# Patient Record
Sex: Female | Born: 1940
Health system: Southern US, Community
[De-identification: ages and names within clinical notes are randomized; demographics above are authoritative.]

## PROBLEM LIST (undated history)

## (undated) DIAGNOSIS — Z1239 Encounter for other screening for malignant neoplasm of breast: Secondary | ICD-10-CM

## (undated) DIAGNOSIS — M5090 Cervical disc disorder, unspecified, unspecified cervical region: Secondary | ICD-10-CM

## (undated) DIAGNOSIS — G629 Polyneuropathy, unspecified: Secondary | ICD-10-CM

## (undated) DIAGNOSIS — F419 Anxiety disorder, unspecified: Secondary | ICD-10-CM

## (undated) DIAGNOSIS — Z124 Encounter for screening for malignant neoplasm of cervix: Secondary | ICD-10-CM

## (undated) DIAGNOSIS — M1990 Unspecified osteoarthritis, unspecified site: Secondary | ICD-10-CM

## (undated) DIAGNOSIS — K589 Irritable bowel syndrome without diarrhea: Secondary | ICD-10-CM

## (undated) DIAGNOSIS — K219 Gastro-esophageal reflux disease without esophagitis: Secondary | ICD-10-CM

## (undated) DIAGNOSIS — G250 Essential tremor: Secondary | ICD-10-CM

## (undated) HISTORY — DX: Encounter for other screening for malignant neoplasm of breast: Z12.39

## (undated) HISTORY — PX: BREAST SURGERY: SHX581

## (undated) HISTORY — DX: Encounter for screening for malignant neoplasm of cervix: Z12.4

## (undated) HISTORY — DX: Essential tremor: G25.0

## (undated) HISTORY — DX: Irritable bowel syndrome, unspecified: K58.9

## (undated) HISTORY — DX: Polyneuropathy, unspecified: G62.9

## (undated) HISTORY — PX: BIOPSY BREAST: PRO8

## (undated) HISTORY — DX: Gastro-esophageal reflux disease without esophagitis: K21.9

## (undated) HISTORY — DX: Cervical disc disorder, unspecified, unspecified cervical region: M50.90

## (undated) HISTORY — DX: Anxiety disorder, unspecified: F41.9

## (undated) HISTORY — DX: Unspecified osteoarthritis, unspecified site: M19.90

---

## 1965-10-20 HISTORY — PX: ABDOMINAL HYSTERECTOMY: SHX81

## 1999-01-29 ENCOUNTER — Encounter: Payer: Self-pay | Admitting: Gastroenterology

## 1999-01-29 ENCOUNTER — Ambulatory Visit (HOSPITAL_COMMUNITY): Admission: RE | Admit: 1999-01-29 | Discharge: 1999-01-29 | Payer: Self-pay | Admitting: Gastroenterology

## 2001-03-02 ENCOUNTER — Encounter (INDEPENDENT_AMBULATORY_CARE_PROVIDER_SITE_OTHER): Payer: Self-pay | Admitting: Specialist

## 2001-03-02 ENCOUNTER — Ambulatory Visit (HOSPITAL_COMMUNITY): Admission: RE | Admit: 2001-03-02 | Discharge: 2001-03-02 | Payer: Self-pay | Admitting: Gastroenterology

## 2002-10-20 HISTORY — PX: ROTATOR CUFF REPAIR: SHX139

## 2003-03-21 ENCOUNTER — Encounter: Admission: RE | Admit: 2003-03-21 | Discharge: 2003-03-21 | Payer: Self-pay | Admitting: Gastroenterology

## 2003-03-21 ENCOUNTER — Encounter: Payer: Self-pay | Admitting: Gastroenterology

## 2004-06-03 ENCOUNTER — Ambulatory Visit (HOSPITAL_COMMUNITY): Admission: RE | Admit: 2004-06-03 | Discharge: 2004-06-03 | Payer: Self-pay | Admitting: Gastroenterology

## 2004-06-03 ENCOUNTER — Encounter (INDEPENDENT_AMBULATORY_CARE_PROVIDER_SITE_OTHER): Payer: Self-pay | Admitting: Specialist

## 2004-08-19 ENCOUNTER — Ambulatory Visit: Payer: Self-pay | Admitting: Family Medicine

## 2005-05-21 ENCOUNTER — Ambulatory Visit: Payer: Self-pay | Admitting: Rheumatology

## 2005-08-28 ENCOUNTER — Ambulatory Visit: Payer: Self-pay | Admitting: Family Medicine

## 2005-10-20 DIAGNOSIS — M5090 Cervical disc disorder, unspecified, unspecified cervical region: Secondary | ICD-10-CM

## 2005-10-20 HISTORY — PX: BREAST BIOPSY: SHX20

## 2005-10-20 HISTORY — DX: Cervical disc disorder, unspecified, unspecified cervical region: M50.90

## 2005-10-30 ENCOUNTER — Encounter: Admission: RE | Admit: 2005-10-30 | Discharge: 2005-10-30 | Payer: Self-pay | Admitting: Gastroenterology

## 2005-12-05 ENCOUNTER — Ambulatory Visit: Payer: Self-pay | Admitting: Family Medicine

## 2006-02-18 ENCOUNTER — Encounter: Admission: RE | Admit: 2006-02-18 | Discharge: 2006-02-18 | Payer: Self-pay | Admitting: Gastroenterology

## 2006-08-31 ENCOUNTER — Ambulatory Visit: Payer: Self-pay | Admitting: General Surgery

## 2006-11-24 ENCOUNTER — Ambulatory Visit (HOSPITAL_COMMUNITY): Admission: RE | Admit: 2006-11-24 | Discharge: 2006-11-24 | Payer: Self-pay | Admitting: Gastroenterology

## 2007-09-21 ENCOUNTER — Ambulatory Visit: Payer: Self-pay | Admitting: Unknown Physician Specialty

## 2007-10-30 LAB — HM PAP SMEAR

## 2008-02-09 ENCOUNTER — Encounter: Admission: RE | Admit: 2008-02-09 | Discharge: 2008-02-09 | Payer: Self-pay | Admitting: Gastroenterology

## 2008-08-02 ENCOUNTER — Ambulatory Visit: Payer: Self-pay | Admitting: Unknown Physician Specialty

## 2008-10-03 ENCOUNTER — Ambulatory Visit: Payer: Self-pay | Admitting: Unknown Physician Specialty

## 2008-12-11 ENCOUNTER — Ambulatory Visit: Payer: Self-pay | Admitting: Unknown Physician Specialty

## 2009-01-01 ENCOUNTER — Ambulatory Visit: Payer: Self-pay | Admitting: Pain Medicine

## 2009-01-04 ENCOUNTER — Ambulatory Visit: Payer: Self-pay | Admitting: Pain Medicine

## 2009-01-17 LAB — HM COLONOSCOPY

## 2009-01-23 ENCOUNTER — Ambulatory Visit: Payer: Self-pay | Admitting: Physician Assistant

## 2009-02-06 ENCOUNTER — Ambulatory Visit: Payer: Self-pay | Admitting: Pain Medicine

## 2009-02-20 ENCOUNTER — Ambulatory Visit: Payer: Self-pay | Admitting: Pain Medicine

## 2009-03-06 ENCOUNTER — Ambulatory Visit: Payer: Self-pay | Admitting: Physician Assistant

## 2009-10-08 ENCOUNTER — Ambulatory Visit: Payer: Self-pay

## 2010-07-05 ENCOUNTER — Ambulatory Visit (HOSPITAL_COMMUNITY): Admission: RE | Admit: 2010-07-05 | Discharge: 2010-07-05 | Payer: Self-pay | Admitting: Gastroenterology

## 2010-10-09 ENCOUNTER — Ambulatory Visit: Payer: Self-pay | Admitting: Unknown Physician Specialty

## 2010-10-20 HISTORY — PX: BREAST BIOPSY: SHX20

## 2010-10-23 ENCOUNTER — Ambulatory Visit: Payer: Self-pay | Admitting: Unknown Physician Specialty

## 2010-11-25 ENCOUNTER — Ambulatory Visit: Payer: Self-pay | Admitting: General Surgery

## 2010-11-27 LAB — PATHOLOGY REPORT

## 2011-03-07 NOTE — Op Note (Signed)
NAME:  [facility], Vanessa Brown                             ACCOUNT NO.:  [id number]   MEDICAL RECORD NO.:  [id number]                   PATIENT TYPE:  AMB   LOCATION:  ENDO                                 FACILITY:  [facility]   PHYSICIAN:  John C. Madilyn Fireman, M.D.                 DATE OF BIRTH:  Jan 03, 1941   DATE OF PROCEDURE:  06/03/2004  DATE OF DISCHARGE:                                 OPERATIVE REPORT   PROCEDURE:  Colonoscopy with polypectomy.   INDICATION FOR PROCEDURE:  History of adenomatous colon polyps.   DESCRIPTION OF PROCEDURE:  The patient was placed in the left lateral  decubitus position and placed on the pulse monitor with continuous low-flow  oxygen delivered by nasal cannula.  She was sedated with 50 mcg IV fentanyl  and 5 mg IV Versed in addition to the medicine given for the previous EGD.  The Olympus video colonoscope was inserted into the rectum and advanced to  the cecum, confirmed by transillumination at McBurney'Brown point and  visualization of the ileocecal valve and appendiceal orifice.  The prep was  excellent.  The cecum, ascending, and transverse colon all appeared normal  with no masses, polyps, diverticula, or other mucosal abnormalities.  Within  the descending and sigmoid colon, there were seen a few scattered  diverticula and no other abnormalities.  Within the sigmoid colon, there was  an 8 mm polyp that was removed by snare.  The remainder of the sigmoid and  rectum appeared normal.  The scope was then withdrawn and the patient  returned to the recovery room in a stable condition.  She tolerated the  procedure well, and there were no immediate complications.   IMPRESSION:  1. Sigmoid colon polyp.  2. Left-sided diverticulosis.   PLAN:  Await histology to determine interval for next colonoscopy.                                               John C. Madilyn Fireman, M.D.    JCH/MEDQ  D:  06/03/2004  T:  06/03/2004  Job:  [id number]   cc:   Cassell Clement, M.D.  [address], [address]  [place]  [place] [address]  Fax: [phone]

## 2011-03-07 NOTE — Procedures (Signed)
[facility]  Patient:    Vanessa Brown, Vanessa Brown                          MRN: [id number] Proc. Date: 03/02/01 Adm. Date:  [id number] Attending:  Louie Bun CC:         Clovis Pu Patty Sermons, M.D.   Procedure Report  PROCEDURE:  Esophagogastroduodenoscopy with biopsy.  ENDOSCOPIST:  Everardo All. Madilyn Fireman, M.D.  INDICATIONS FOR PROCEDURE: Epigastric abdominal pain.  The patient is undergoing repeat colonoscopy due to history of adenomatous colon polyps.  DESCRIPTION OF PROCEDURE:   The patient was placed in the left lateral decubitus position and placed on the pulse monitor with continuous low-flow oxygen delivered by nasal cannula.  She was sedated with 50 mg IV Demerol and 6 mg IV Versed.  The Olympus video endoscope was advanced under direct vision into the oropharynx and esophagus.  The esophagus was straight and of normal caliber with the squamocolumnar line at 38 cm.  There was no visible hiatal hernia, ring, stricture, or other abnormality to the GE junction.  The stomach was entered, and a small amount of liquid secretion was suctioned from the fundus.  Retroflexed view of the cardia was unremarkable.  The fundus and proximal body appeared normal.   In the distal body and antrum, there was diffuse erythema and multiple, approximately 6 or 7 shallow ulcers and erosions with exudate, most with a black surface primarily along the greater curvature.  These were consistent with ulcerations induced by nonsteroidal anti-inflammatory drugs.  There were no stigma of hemorrhage other than the black discoloration of the erosions in general.  Most of these were near the level of the angularis but on the greater curvature and did not involve the prepyloric area.  The pylorus itself was not deformed and easily allowed passage of the endoscope tip to the duodenum.  The bulb and second portion were well inspected and appeared to be within normal limits.  The endoscope was  then withdrawn back into the stomach and CLOtest obtained.  The scope was then withdrawn, and the patient  returned to the recovery room in stable condition.  She tolerated the procedure well, and there were no immediate complications.  IMPRESSION:  Multiple gastric ulcers and erosions.  PLAN: 1. Review NSAID history. 2. Await CLOtest and treat for eradication of Helicobacter if positive.    Otherwise treat with proton pump inhibitor. DD:  03/02/01 TD:  03/02/01 Job: [id number]

## 2011-03-07 NOTE — Op Note (Signed)
NAME:  [facility], Vanessa Brown                             ACCOUNT NO.:  [id number]   MEDICAL RECORD NO.:  [id number]                   PATIENT TYPE:  AMB   LOCATION:  ENDO                                 FACILITY:  [facility]   PHYSICIAN:  John C. Madilyn Fireman, M.D.                 DATE OF BIRTH:  1941/01/18   DATE OF PROCEDURE:  06/03/2004  DATE OF DISCHARGE:                                 OPERATIVE REPORT   PROCEDURE:  Esophagogastroduodenoscopy with biopsy.   INDICATIONS FOR PROCEDURE:  Patient with history of gastroesophageal reflux  disease who has had a globus sensation in her throat as well as chronic sore  throat, with ear, nose, and throat evaluation incriminating ongoing reflux  as the source of her symptoms.  Her proton pump inhibitors have been  increased to b.i.d. dosing.  She has also had a history of multiple gastric  ulcers in the past and is undergoing routine surveillance  esophagogastroduodenoscopy today.   DESCRIPTION OF PROCEDURE:  The patient was placed in the left lateral  decubitus position and placed on the pulse monitor with continuous low-flow  oxygen delivered by nasal cannula.  She was sedated with 50 mcg of IV  Fentanyl and 5 mg of IV Versed.  The videoendoscope was advanced under  direct vision into the oropharynx and esophagus.  The esophagus was straight  and of normal caliber, with the squamocolumnar line at 37 cm above a 2-cm  hiatal hernia.  There was no visible esophagitis.  The stomach was entered,  and a small amount of liquid secretions were suctioned from the fundus.  A  retroflex view of the cardia was unremarkable.  The fundus and body appeared  normal.  The antrum showed erythema and granularity, consistent with mild  gastritis with no focal erosions or ulcers.  CLOtest was obtained.  The  pylorus was nondeformed and easily allowed passage of the endoscope tip into  the duodenum.  Both the bulb and second portion were well-inspected and  appeared to within  normal limits.  The scope was then withdrawn, and the  patient prepared for colonoscopy.  She tolerated the procedure well, and  there were no immediate complications.   IMPRESSION:  Small hiatal hernia with mild antral gastritis.   PLAN:  Await CLOtest and will proceed with colonoscopy.                                               John C. Madilyn Fireman, M.D.    JCH/MEDQ  D:  06/03/2004  T:  06/03/2004  Job:  [id number]

## 2011-03-07 NOTE — Procedures (Signed)
[facility]  Patient:    Vanessa Brown, Vanessa Brown                          MRN: [id number] Proc. Date: 03/02/01 Adm. Date:  [id number] Attending:  Louie Bun CC:         Clovis Pu Patty Sermons, M.D.   Procedure Report  PROCEDURE:  Colonoscopy with polypectomy.  GASTROENTEROLOGIST:  Everardo All. Madilyn Fireman, M.D.  INDICATIONS FOR PROCEDURE:  History of adenomatous colon polyps three years ago.  DESCRIPTION OF PROCEDURE:   The patient was placed in the left lateral decubitus position and placed on the pulse monitor with continuous low-flow oxygen delivered by nasal cannula.  She was sedated with 25 mg IV Demerol and 2 mg IV Versed in addition to the medications given for the previous EGD.  The Olympus video colonoscope was inserted into the rectum and advanced to the cecum, confirmed by transillumination of McBurneys point and visualization of the ileocecal valve and appendiceal orifice.  The prep was excellent.  The cecum and ascending colon appeared normal as did the transverse colon with no polyps, masses, diverticula, or other mucosal abnormalities.  Within the descending colon there was seen two small polyps close to each other, one 8 and one 6 mm in diameter.  The first was removed with mini snare.  The second was fulgurated by hot biopsy.  The sigmoid and rectum appeared normal with no further polyps, masses, diverticula, or other mucosal abnormalities.  The scope was then withdrawn, and the patient returned to the recovery room in stable condition.  She tolerated the procedure well, and there were no immediate complications.  IMPRESSIONS:  Two descending colon polyps, otherwise normal colonoscopy.  PLAN:  Await histology and will probably repeat colonoscopy in three years. DD:  03/02/01 TD:  03/02/01 Job: [id number]

## 2011-05-07 ENCOUNTER — Ambulatory Visit: Payer: Self-pay | Admitting: General Surgery

## 2011-09-03 ENCOUNTER — Encounter: Payer: Self-pay | Admitting: Internal Medicine

## 2011-09-03 ENCOUNTER — Ambulatory Visit (INDEPENDENT_AMBULATORY_CARE_PROVIDER_SITE_OTHER): Payer: Medicare Other | Admitting: Internal Medicine

## 2011-09-03 VITALS — BP 110/60 | HR 70 | Temp 98.2°F | Resp 16 | Ht 63.0 in | Wt 138.8 lb

## 2011-09-03 DIAGNOSIS — G250 Essential tremor: Secondary | ICD-10-CM

## 2011-09-03 DIAGNOSIS — G252 Other specified forms of tremor: Secondary | ICD-10-CM

## 2011-09-03 DIAGNOSIS — Z8719 Personal history of other diseases of the digestive system: Secondary | ICD-10-CM | POA: Insufficient documentation

## 2011-09-03 DIAGNOSIS — K2970 Gastritis, unspecified, without bleeding: Secondary | ICD-10-CM

## 2011-09-03 DIAGNOSIS — G629 Polyneuropathy, unspecified: Secondary | ICD-10-CM | POA: Insufficient documentation

## 2011-09-03 DIAGNOSIS — Z124 Encounter for screening for malignant neoplasm of cervix: Secondary | ICD-10-CM | POA: Insufficient documentation

## 2011-09-03 DIAGNOSIS — Z8711 Personal history of peptic ulcer disease: Secondary | ICD-10-CM | POA: Insufficient documentation

## 2011-09-03 DIAGNOSIS — K2990 Gastroduodenitis, unspecified, without bleeding: Secondary | ICD-10-CM

## 2011-09-03 DIAGNOSIS — Z1239 Encounter for other screening for malignant neoplasm of breast: Secondary | ICD-10-CM

## 2011-09-03 MED ORDER — ESTRADIOL 0.025 MG/24HR TD PTTW: 1.0000 | MEDICATED_PATCH | TRANSDERMAL | Status: DC

## 2011-09-03 MED ORDER — CLONIDINE HCL 0.2 MG PO TABS: 0.2000 mg | ORAL_TABLET | Freq: Every day | ORAL | Status: DC

## 2011-09-03 NOTE — Progress Notes (Signed)
Subjective:    Patient ID: Vanessa Brown, female    DOB: 1941/04/06, 70 y.o.   MRN: [id number]  HPI  70 yo white female with history of IBS presents with chronic recurrent pains involvong right and left lateral thorax, as well as persistent abdominal pain.  Has had comprehensive GI evaluation as well as MRIs of lumbar spine to evaluate source of pain .  Currently her pain in the abdominal area is burning in nature, but the lateral chest wall pains are more "sore" in nature.  She is not troubled by the pains,  feels that since they are chronic and have been worked up sufficiently she can tolerate them.  She did not tolerate prior trial of Lyrica started by the pain clinic. She hras tried Lidoderm patches for neuropathy in the past  with limited success on the posterior thighs .  Avoids NSAIDs  due to history of ulcers, but tylenol helps.  Past Medical History  Diagnosis Date  . Anxiety   . Irritable bowel syndrome (IBS)     Dorena Cookey, [facility]  . Arthritis     spine, prior Pain Clinic patient  . Screening for breast cancer   . Screening for cervical cancer   . Tremor, essential     treated with primidone 25 mg qhs  . GERD (gastroesophageal reflux disease)   . Gastric ulcer     H Pylori positive  . Duodenal ulcer   . Neuropathy     "toilet seat", Sherryll Burger), caused  by sitting on commode    No current outpatient prescriptions on file prior to visit.       Review of Systems  Constitutional: Negative for fever, chills and unexpected weight change.  HENT: Negative for hearing loss, ear pain, nosebleeds, congestion, sore throat, facial swelling, rhinorrhea, sneezing, mouth sores, trouble swallowing, neck pain, neck stiffness, voice change, postnasal drip, sinus pressure, tinnitus and ear discharge.   Eyes: Negative for pain, discharge, redness and visual disturbance.  Respiratory: Negative for cough, chest tightness, shortness of breath, wheezing and stridor.   Cardiovascular: Negative for  chest pain, palpitations and leg swelling.  Musculoskeletal: Negative for myalgias and arthralgias.  Skin: Negative for color change and rash.  Neurological: Negative for dizziness, weakness, light-headedness and headaches.  Hematological: Negative for adenopathy.      BP 110/60  Pulse 70  Temp(Src) 98.2 F (36.8 C) (Oral)  Resp 16  Ht 5\' 3"  (1.6 m)  Wt 138 lb 12 oz (62.937 kg)  BMI 24.58 kg/m2  SpO2 98%  Objective:   Physical Exam  Constitutional: She is oriented to person, place, and time. She appears well-developed and well-nourished.  HENT:  Mouth/Throat: Oropharynx is clear and moist.  Eyes: EOM are normal. Pupils are equal, round, and reactive to light. No scleral icterus.  Neck: Normal range of motion. Neck supple. No JVD present. No thyromegaly present.  Cardiovascular: Normal rate, regular rhythm, normal heart sounds and intact distal pulses.   Pulmonary/Chest: Effort normal and breath sounds normal.  Abdominal: Soft. Bowel sounds are normal. She exhibits no mass. There is no tenderness.  Musculoskeletal: Normal range of motion. She exhibits no edema.  Lymphadenopathy:    She has no cervical adenopathy.  Neurological: She is alert and oriented to person, place, and time.  Skin: Skin is warm and dry.  Psychiatric: She has a normal mood and affect.       Assessment & Plan:  Irritable Bowel Syndrome;  Currently managed with food restriction .  No further workup at this time. Neuropathy:  Records requested.   History of gastric/duodenal ulcer:  Continue NSAID avoidance, continue PPI.  Trial of Mylanta for burnign symptoms,  If no improvement H Pylori testing offered.

## 2011-09-03 NOTE — Patient Instructions (Addendum)
I recommend a trial of Mylanta gas for your burning stomach, to see if it works better than the gaviscon tablets.  If no improvement in a few weeks, we should check you for the H Pylori bacteria again .

## 2011-09-06 ENCOUNTER — Encounter: Payer: Self-pay | Admitting: Internal Medicine

## 2011-09-06 DIAGNOSIS — G250 Essential tremor: Secondary | ICD-10-CM | POA: Insufficient documentation

## 2011-09-06 DIAGNOSIS — K219 Gastro-esophageal reflux disease without esophagitis: Secondary | ICD-10-CM | POA: Insufficient documentation

## 2011-09-06 DIAGNOSIS — Z1239 Encounter for other screening for malignant neoplasm of breast: Secondary | ICD-10-CM | POA: Insufficient documentation

## 2011-09-06 DIAGNOSIS — K589 Irritable bowel syndrome without diarrhea: Secondary | ICD-10-CM | POA: Insufficient documentation

## 2011-09-06 NOTE — Assessment & Plan Note (Signed)
Mammogram ordered as she is due.

## 2011-09-06 NOTE — Assessment & Plan Note (Signed)
Bdiagnosied by priro neurology evaluation. Continue Primidone.

## 2011-10-28 ENCOUNTER — Telehealth: Payer: Self-pay | Admitting: *Deleted

## 2011-10-28 DIAGNOSIS — Z79890 Hormone replacement therapy (postmenopausal): Secondary | ICD-10-CM

## 2011-10-28 MED ORDER — ESTRADIOL 0.025 MG/24HR TD PTWK: 1.0000 | MEDICATED_PATCH | TRANSDERMAL | Status: DC

## 2011-10-28 NOTE — Telephone Encounter (Signed)
Yes, rx sent.

## 2011-10-28 NOTE — Telephone Encounter (Signed)
Pharmacy is calling about patient's vivelle-dot rx that was sent in NOv. It is going to be a $30 copay for the patient and patient is asking if she can go back to the climara that she was originally on when she saw her previous Dr. Because it is only a $10 copay. Please advise.

## 2011-10-29 ENCOUNTER — Encounter: Payer: Self-pay | Admitting: Internal Medicine

## 2011-10-29 ENCOUNTER — Ambulatory Visit (INDEPENDENT_AMBULATORY_CARE_PROVIDER_SITE_OTHER): Payer: Medicare Other | Admitting: Internal Medicine

## 2011-10-29 DIAGNOSIS — E039 Hypothyroidism, unspecified: Secondary | ICD-10-CM

## 2011-10-29 DIAGNOSIS — M5090 Cervical disc disorder, unspecified, unspecified cervical region: Secondary | ICD-10-CM | POA: Insufficient documentation

## 2011-10-29 DIAGNOSIS — M899 Disorder of bone, unspecified: Secondary | ICD-10-CM | POA: Diagnosis not present

## 2011-10-29 DIAGNOSIS — G589 Mononeuropathy, unspecified: Secondary | ICD-10-CM

## 2011-10-29 DIAGNOSIS — M8580 Other specified disorders of bone density and structure, unspecified site: Secondary | ICD-10-CM

## 2011-10-29 DIAGNOSIS — G629 Polyneuropathy, unspecified: Secondary | ICD-10-CM

## 2011-10-29 DIAGNOSIS — E538 Deficiency of other specified B group vitamins: Secondary | ICD-10-CM

## 2011-10-29 DIAGNOSIS — R109 Unspecified abdominal pain: Secondary | ICD-10-CM

## 2011-10-29 DIAGNOSIS — M949 Disorder of cartilage, unspecified: Secondary | ICD-10-CM | POA: Diagnosis not present

## 2011-10-29 DIAGNOSIS — M5126 Other intervertebral disc displacement, lumbar region: Secondary | ICD-10-CM

## 2011-10-29 DIAGNOSIS — R52 Pain, unspecified: Secondary | ICD-10-CM

## 2011-10-29 DIAGNOSIS — R1084 Generalized abdominal pain: Secondary | ICD-10-CM

## 2011-10-29 MED ORDER — TRAMADOL HCL 50 MG PO TABS: 50.0000 mg | ORAL_TABLET | Freq: Four times a day (QID) | ORAL | Status: DC | PRN

## 2011-10-29 MED ORDER — DEXLANSOPRAZOLE 60 MG PO CPDR: 60.0000 mg | DELAYED_RELEASE_CAPSULE | Freq: Every day | ORAL | Status: AC

## 2011-10-29 MED ORDER — DICYCLOMINE HCL 20 MG PO TABS: 20.0000 mg | ORAL_TABLET | Freq: Three times a day (TID) | ORAL | Status: DC

## 2011-10-29 NOTE — Patient Instructions (Signed)
For your stomach I am adding antispasmodic  called dicyclomine.  Take 30 minutes prior to each meal.   Adding Dexilant for the acid.  Adding tramadol for relief of back and hip pain.  We may end up repeating the MRI of your lower spine, if your prior MRI is suspicious for spinal stenosis

## 2011-10-29 NOTE — Progress Notes (Signed)
Subjective:    Patient ID: Vanessa Brown, female    DOB: [birth year], 71 y.o.   MRN: [id number]  HPI  71 yr old with history of H pylori  gastritis and IBs presents with 3 week history of abdominal bloating,  aching,  nausea, and constipation alternating with diarrhea.  Has occasions of moderate reflux eventually relieved with gaviscon.  Stopped omeprazole bc it wasn't helping. No prior relief with other PPIs but can't name them. Last endoscopy was possibly done in 2010,  CT scan of abdomen in 2011 normal except for DDD .   Also having low back pain which radiates to both hips and legs which ache.   Last MRI of lumbar spine showed bulging disks.    Has been taking only tylenol,  No NSAIDs  Or ASA. Reports bottom of her feet burning.  History of "toilet seat neuropathy' diagnosed and treated by Dr. Sherryll Burger with lidocaine patches.  Waking up at 2 am , but not being kept up by burning.  Uses Zquil for insomnia.    Past Medical History  Diagnosis Date  . Anxiety   . Irritable bowel syndrome (IBS)     Dorena Cookey, [facility]  . Arthritis     spine, prior Pain Clinic patient  . Screening for breast cancer   . Screening for cervical cancer   . Tremor, essential     treated with primidone 25 mg qhs  . GERD (gastroesophageal reflux disease)   . Gastric ulcer     H Pylori positive  . Duodenal ulcer   . Neuropathy     "toilet seat", Sherryll Burger), caused  by sitting on commode  . Cervical back pain with evidence of disc disease 2007    prior epidural steorid injection s   Current Outpatient Prescriptions on File Prior to Visit  Medication Sig Dispense Refill  . calcium carbonate (OS-CAL) 600 MG TABS Take 600 mg by mouth 2 (two) times daily with a meal.        . cloNIDine (CATAPRES) 0.2 MG tablet Take 1 tablet (0.2 mg total) by mouth at bedtime.  30 tablet  11  . estradiol (CLIMARA) 0.025 mg/24hr Place 1 patch (0.025 mg total) onto the skin once a week.  4 patch  12  . omeprazole (PRILOSEC) 40 MG capsule Take 40  mg by mouth 2 (two) times daily as needed.        . primidone (MYSOLINE) 50 MG tablet Take one-half tablet in the morning, and one-half tablet in the evening.           Review of Systems  Constitutional: Negative for fever, chills and unexpected weight change.  HENT: Negative for hearing loss, ear pain, nosebleeds, congestion, sore throat, facial swelling, rhinorrhea, sneezing, mouth sores, trouble swallowing, neck pain, neck stiffness, voice change, postnasal drip, sinus pressure, tinnitus and ear discharge.   Eyes: Negative for pain, discharge, redness and visual disturbance.  Respiratory: Negative for cough, chest tightness, shortness of breath, wheezing and stridor.   Cardiovascular: Negative for chest pain, palpitations and leg swelling.  Gastrointestinal: Positive for abdominal pain, diarrhea and constipation. Negative for vomiting.  Musculoskeletal: Positive for back pain. Negative for myalgias and arthralgias.  Skin: Negative for color change and rash.  Neurological: Positive for tremors. Negative for dizziness, weakness, light-headedness and headaches.  Hematological: Negative for adenopathy.  Psychiatric/Behavioral: Positive for sleep disturbance.       Objective:   Physical Exam  Constitutional: She is oriented to person, place, and  time. She appears well-developed and well-nourished.  HENT:  Mouth/Throat: Oropharynx is clear and moist.  Eyes: EOM are normal. Pupils are equal, round, and reactive to light. No scleral icterus.  Neck: Normal range of motion. Neck supple. No JVD present. No thyromegaly present.  Cardiovascular: Normal rate, regular rhythm, normal heart sounds and intact distal pulses.   Pulmonary/Chest: Effort normal and breath sounds normal.  Abdominal: Soft. Bowel sounds are normal. She exhibits no mass. There is no tenderness.  Musculoskeletal: Normal range of motion. She exhibits no edema.  Lymphadenopathy:    She has no cervical adenopathy.    Neurological: She is alert and oriented to person, place, and time.  Skin: Skin is warm and dry.  Psychiatric: She has a normal mood and affect.       Assessment & Plan:   Lumbago due to displacement of intervertebral disc Multilevel, without spinal stenosis, by Oct 2002 MRI .  Repeated in 2010 with progression noted and nerve root comopression suggested at multiple levels.  Prior epidural injections by Laban Emperor.   Osteopenia By 2009 DEXA .  Not sure if repeat was done in 2012 as planned by Dr. Barnabas Lister per his notes.   Neuropathy Her previous neuropathy diagnosis did not involve the feet (posterior femoral cutaneous nerve lesion) by Dr.Shah of [facility]. She did not have EMG nerve conduction studies at that time. Given her concurrent tremor, burning feet, known DDD of lumbar spine with progression by 2010 MRI.  will refer to Dr. Modesto Charon for assessment with EMG/nerve conduction studies  And hopefully a unifying diagnosis. Will do serolgoies to rule otu reversible causes (B12, TSH, 24 hr urine collection for heavy metals).   Abdominal pain, acute, generalized She has a history of ulcer and IBS, but her lipase was mildly elevated.  Will repeat and if elevated, consideer MRCP vs reevaluation by her gastroenterologist Dorena Cookey of [place].    Over 72 minutes was spent reviewing patients records from Neurology, Orthopedics, Pain cliniC, gastrenterolgy,  And Gynecology and evaluating patient in office.   Updated Medication List Outpatient Encounter Prescriptions as of 10/29/2011  Medication Sig Dispense Refill  . calcium carbonate (OS-CAL) 600 MG TABS Take 600 mg by mouth 2 (two) times daily with a meal.        . cloNIDine (CATAPRES) 0.2 MG tablet Take 1 tablet (0.2 mg total) by mouth at bedtime.  30 tablet  11  . estradiol (CLIMARA) 0.025 mg/24hr Place 1 patch (0.025 mg total) onto the skin once a week.  4 patch  12  . omeprazole (PRILOSEC) 40 MG capsule Take 40 mg by mouth 2 (two) times  daily as needed.        . primidone (MYSOLINE) 50 MG tablet Take one-half tablet in the morning, and one-half tablet in the evening.       . traMADol (ULTRAM) 50 MG tablet Take 1 tablet (50 mg total) by mouth every 6 (six) hours as needed.  120 tablet  3  . DISCONTD: traMADol (ULTRAM) 50 MG tablet Take 50 mg by mouth every 6 (six) hours as needed.      Marland Kitchen dexlansoprazole (DEXILANT) 60 MG capsule Take 1 capsule (60 mg total) by mouth daily.  30 capsule  3  . dicyclomine (BENTYL) 20 MG tablet Take 1 tablet (20 mg total) by mouth 3 (three) times daily before meals.  90 tablet  3

## 2011-10-30 ENCOUNTER — Telehealth: Payer: Self-pay | Admitting: *Deleted

## 2011-10-30 ENCOUNTER — Encounter: Payer: Self-pay | Admitting: Internal Medicine

## 2011-10-30 DIAGNOSIS — M8580 Other specified disorders of bone density and structure, unspecified site: Secondary | ICD-10-CM | POA: Insufficient documentation

## 2011-10-30 DIAGNOSIS — R1084 Generalized abdominal pain: Secondary | ICD-10-CM | POA: Insufficient documentation

## 2011-10-30 DIAGNOSIS — R109 Unspecified abdominal pain: Secondary | ICD-10-CM

## 2011-10-30 LAB — COMPREHENSIVE METABOLIC PANEL
ALT: 20 U/L (ref 0–35)
AST: 23 U/L (ref 0–37)
Alkaline Phosphatase: 76 U/L (ref 39–117)
Chloride: 103 mEq/L (ref 96–112)
Creatinine, Ser: 0.8 mg/dL (ref 0.4–1.2)
Total Bilirubin: 0.7 mg/dL (ref 0.3–1.2)

## 2011-10-30 NOTE — Telephone Encounter (Signed)
Patient's husband brought in patient's stool sample and while he was here he said that patient is having a lot nausea, vomiting from the dicyclomine that was called in. She wants to know if she can get something else called in.

## 2011-10-30 NOTE — Assessment & Plan Note (Addendum)
Multilevel, without spinal stenosis, by Oct 2002 MRI .  Repeated in 2010 with progression noted and nerve root comopression suggested at multiple levels.  Prior epidural injections by Laban Emperor.

## 2011-10-30 NOTE — Assessment & Plan Note (Signed)
Her previous neuropathy diagnosis did not involve the feet (posterior femoral cutaneous nerve lesion) by Dr.Shah of [facility]. She did not have EMG nerve conduction studies at that time. Given her concurrent tremor, burning feet, known DDD of lumbar spine with progression by 2010 MRI.  will refer to Dr. Modesto Charon for assessment with EMG/nerve conduction studies  And hopefully a unifying diagnosis. Will do serolgoies to rule otu reversible causes (B12, TSH, 24 hr urine collection for heavy metals).

## 2011-10-30 NOTE — Assessment & Plan Note (Signed)
She has a history of ulcer and IBS, but her lipase was mildly elevated.  Will repeat and if elevated, consideer MRCP vs reevaluation by her gastroenterologist Dorena Cookey of [place].

## 2011-10-30 NOTE — Assessment & Plan Note (Signed)
By 2009 DEXA .  Not sure if repeat was done in 2012 as planned by Dr. Barnabas Lister per his notes.

## 2011-10-31 LAB — HELICOBACTER PYLORI  SPECIAL ANTIGEN: H. PYLORI Antigen: NEGATIVE

## 2011-10-31 MED ORDER — PROMETHAZINE HCL 12.5 MG PO TABS: 12.5000 mg | ORAL_TABLET | Freq: Three times a day (TID) | ORAL | Status: AC | PRN

## 2011-10-31 NOTE — Telephone Encounter (Signed)
Left message notifying patient. Will try calling her again on Monday to make sure she got the message and to make sure she got her rx.

## 2011-10-31 NOTE — Telephone Encounter (Signed)
Tell her to stop the dicyclcomine and call her in promethazine 12.5 mg one tablet every 8 hours prn nausea  #60 1 refills

## 2011-11-04 ENCOUNTER — Other Ambulatory Visit: Payer: Self-pay | Admitting: Internal Medicine

## 2011-11-04 ENCOUNTER — Other Ambulatory Visit (INDEPENDENT_AMBULATORY_CARE_PROVIDER_SITE_OTHER): Payer: Medicare Other | Admitting: *Deleted

## 2011-11-04 DIAGNOSIS — E538 Deficiency of other specified B group vitamins: Secondary | ICD-10-CM

## 2011-11-04 DIAGNOSIS — R109 Unspecified abdominal pain: Secondary | ICD-10-CM | POA: Diagnosis not present

## 2011-11-04 DIAGNOSIS — E039 Hypothyroidism, unspecified: Secondary | ICD-10-CM

## 2011-11-04 DIAGNOSIS — G629 Polyneuropathy, unspecified: Secondary | ICD-10-CM

## 2011-11-04 DIAGNOSIS — G589 Mononeuropathy, unspecified: Secondary | ICD-10-CM | POA: Diagnosis not present

## 2011-11-04 LAB — TSH: TSH: 2.54 u[IU]/mL (ref 0.35–5.50)

## 2011-11-04 LAB — LIPASE: Lipase: 31 U/L (ref 11.0–59.0)

## 2011-11-04 LAB — VITAMIN B12: Vitamin B-12: 324 pg/mL (ref 211–911)

## 2011-11-04 LAB — FOLATE: Folate: 11.1 ng/mL (ref >5.9)

## 2011-11-18 ENCOUNTER — Ambulatory Visit: Payer: Self-pay | Admitting: Internal Medicine

## 2011-11-18 DIAGNOSIS — R928 Other abnormal and inconclusive findings on diagnostic imaging of breast: Secondary | ICD-10-CM | POA: Diagnosis not present

## 2011-11-18 DIAGNOSIS — Z1231 Encounter for screening mammogram for malignant neoplasm of breast: Secondary | ICD-10-CM | POA: Diagnosis not present

## 2011-11-25 ENCOUNTER — Other Ambulatory Visit: Payer: Self-pay | Admitting: Gastroenterology

## 2011-11-25 DIAGNOSIS — R1013 Epigastric pain: Secondary | ICD-10-CM | POA: Diagnosis not present

## 2011-11-28 ENCOUNTER — Ambulatory Visit
Admission: RE | Admit: 2011-11-28 | Discharge: 2011-11-28 | Disposition: A | Discharge status: Home / Self Care | Payer: Medicare Other | Source: Ambulatory Visit | Attending: Gastroenterology | Admitting: Gastroenterology

## 2011-11-28 DIAGNOSIS — Q638 Other specified congenital malformations of kidney: Secondary | ICD-10-CM | POA: Diagnosis not present

## 2011-11-28 DIAGNOSIS — R109 Unspecified abdominal pain: Secondary | ICD-10-CM | POA: Diagnosis not present

## 2011-12-19 ENCOUNTER — Ambulatory Visit (INDEPENDENT_AMBULATORY_CARE_PROVIDER_SITE_OTHER): Payer: Medicare Other | Admitting: Internal Medicine

## 2011-12-19 ENCOUNTER — Encounter: Payer: Self-pay | Admitting: Internal Medicine

## 2011-12-19 DIAGNOSIS — Z0000 Encounter for general adult medical examination without abnormal findings: Secondary | ICD-10-CM | POA: Diagnosis not present

## 2011-12-19 DIAGNOSIS — G629 Polyneuropathy, unspecified: Secondary | ICD-10-CM

## 2011-12-19 DIAGNOSIS — K589 Irritable bowel syndrome without diarrhea: Secondary | ICD-10-CM

## 2011-12-19 DIAGNOSIS — M25559 Pain in unspecified hip: Secondary | ICD-10-CM

## 2011-12-19 NOTE — Progress Notes (Signed)
  Subjective:    Patient ID: Vanessa Brown, female    DOB: 05/12/1941, 71 y.o.   MRN: [id number]  HPI  The patient is here for annual Medicare wellness examination and management of other chronic and acute problems.    The risk factors are reflected in the social history.  The roster of all physicians providing medical care to patient - is listed in the Snapshot section of the chart.  Activities of daily living:  The patient is 100% inedpendent in all ADLs: dressing, toileting, feeding as well as independent mobility  Home safety : The patient has smoke detectors in the home. They wear seatbelts.No firearms at home ( firearms are present in the home, kept in a safe fashion). There is no violence in the home.   There is no risks for hepatitis, STDs or HIV. There is no   history of blood transfusion. They have no travel history to infectious disease endemic areas of the world.  The patient has (has not) seen their dentist in the last six month. They have (not) seen their eye doctor in the last year. They deny (admit to) any hearing difficulty and have not had audiologic testing in the last year.  They do not  have excessive sun exposure. Discussed the need for sun protection: hats, long sleeves and use of sunscreen if there is significant sun exposure.   Diet: the importance of a healthy diet is discussed. They do have a healthy (unhealthy-high fat/fast food) diet.  The patient has a regular exercise program: walking for 20 minutes 3 days per weekk.  The benefits of regular aerobic exercise were discussed.  Depression screen: there are no signs or vegative symptoms of depression- irritability, change in appetite, anhedonia, sadness/tearfullness.  Cognitive assessment: the patient manages all their financial and personal affairs and is actively engaged. They could relate day,date,year and events; recalled 3/3 objects at 3 minutes; performed clock-face test normally.  The following portions of the  patient's history were reviewed and updated as appropriate: allergies, current medications, past family history, past medical history,  past surgical history, past social history  and problem list.  Vision, hearing, body mass index were assessed and reviewed.   During the course of the visit the patient was educated and counseled about appropriate screening and preventive services including : fall prevention , diabetes screening, nutrition counseling, colorectal cancer screening, and recommended immunizations.      Review of Systems  Constitutional: Negative for fever, chills, appetite change, fatigue and unexpected weight change.  HENT: Negative for ear pain, congestion, sore throat, trouble swallowing, neck pain, voice change and sinus pressure.   Eyes: Negative for visual disturbance.  Respiratory: Negative for cough, shortness of breath, wheezing and stridor.   Cardiovascular: Negative for chest pain, palpitations and leg swelling.  Gastrointestinal: Negative for nausea, vomiting, abdominal pain, diarrhea, constipation, blood in stool, abdominal distention and anal bleeding.  Genitourinary: Negative for dysuria and flank pain.  Musculoskeletal: Negative for myalgias, arthralgias and gait problem.  Skin: Negative for color change and rash.  Neurological: Negative for dizziness and headaches.  Hematological: Negative for adenopathy. Does not bruise/bleed easily.  Psychiatric/Behavioral: Negative for suicidal ideas, sleep disturbance and dysphoric mood. The patient is not nervous/anxious.        Objective:   Physical Exam        Assessment & Plan:

## 2011-12-19 NOTE — Assessment & Plan Note (Signed)
Scheduled to see Dr Sherryll Burger for neuropathy followup .

## 2011-12-19 NOTE — Assessment & Plan Note (Signed)
Has been taking omeprazole twice daily since seeing Dr. Madilyn Fireman

## 2011-12-19 NOTE — Patient Instructions (Signed)
We will get the records from [facility].  Iif we can't , we  will have you return for fasting lipids and fasting glucose this spring

## 2011-12-21 ENCOUNTER — Encounter: Payer: Self-pay | Admitting: Internal Medicine

## 2011-12-24 ENCOUNTER — Encounter: Payer: Self-pay | Admitting: Internal Medicine

## 2011-12-24 DIAGNOSIS — IMO0001 Reserved for inherently not codable concepts without codable children: Secondary | ICD-10-CM | POA: Diagnosis not present

## 2011-12-24 DIAGNOSIS — M25559 Pain in unspecified hip: Secondary | ICD-10-CM | POA: Diagnosis not present

## 2011-12-24 DIAGNOSIS — M25659 Stiffness of unspecified hip, not elsewhere classified: Secondary | ICD-10-CM | POA: Diagnosis not present

## 2011-12-24 DIAGNOSIS — M6281 Muscle weakness (generalized): Secondary | ICD-10-CM | POA: Diagnosis not present

## 2011-12-29 DIAGNOSIS — R1013 Epigastric pain: Secondary | ICD-10-CM | POA: Diagnosis not present

## 2012-01-19 ENCOUNTER — Encounter: Payer: Self-pay | Admitting: Internal Medicine

## 2012-01-19 DIAGNOSIS — M6281 Muscle weakness (generalized): Secondary | ICD-10-CM | POA: Diagnosis not present

## 2012-01-19 DIAGNOSIS — M25659 Stiffness of unspecified hip, not elsewhere classified: Secondary | ICD-10-CM | POA: Diagnosis not present

## 2012-01-19 DIAGNOSIS — IMO0001 Reserved for inherently not codable concepts without codable children: Secondary | ICD-10-CM | POA: Diagnosis not present

## 2012-01-19 DIAGNOSIS — M25559 Pain in unspecified hip: Secondary | ICD-10-CM | POA: Diagnosis not present

## 2012-02-16 DIAGNOSIS — H2510 Age-related nuclear cataract, unspecified eye: Secondary | ICD-10-CM | POA: Diagnosis not present

## 2012-03-08 DIAGNOSIS — R51 Headache: Secondary | ICD-10-CM | POA: Diagnosis not present

## 2012-03-08 DIAGNOSIS — G250 Essential tremor: Secondary | ICD-10-CM | POA: Diagnosis not present

## 2012-08-03 DIAGNOSIS — Z23 Encounter for immunization: Secondary | ICD-10-CM | POA: Diagnosis not present

## 2012-09-22 ENCOUNTER — Other Ambulatory Visit: Payer: Self-pay | Admitting: Internal Medicine

## 2012-10-27 ENCOUNTER — Other Ambulatory Visit: Payer: Self-pay | Admitting: Internal Medicine

## 2012-10-27 DIAGNOSIS — Z79890 Hormone replacement therapy (postmenopausal): Secondary | ICD-10-CM

## 2012-10-27 NOTE — Telephone Encounter (Signed)
estradiol (CLIMARA) 0.025 mg/  # 28

## 2012-10-28 NOTE — Telephone Encounter (Signed)
Pt last seen on 12/19/11 and med last filled on 11/07/11. Ok to fill?

## 2012-10-28 NOTE — Telephone Encounter (Signed)
Please find out if she has had discontinued the climara and wants to resume it or whether she has been taking it all along.

## 2012-10-29 MED ORDER — ESTRADIOL 0.025 MG/24HR TD PTWK: 1.0000 | MEDICATED_PATCH | TRANSDERMAL | Status: DC

## 2012-10-29 NOTE — Telephone Encounter (Signed)
Sent to [facility]

## 2012-10-29 NOTE — Telephone Encounter (Signed)
The patient called she has been taking the Climara all the time. She is out of he medication.

## 2012-11-18 ENCOUNTER — Ambulatory Visit: Payer: Self-pay | Admitting: Internal Medicine

## 2012-11-18 DIAGNOSIS — Z1231 Encounter for screening mammogram for malignant neoplasm of breast: Secondary | ICD-10-CM | POA: Diagnosis not present

## 2012-11-19 ENCOUNTER — Encounter: Payer: Self-pay | Admitting: Internal Medicine

## 2012-11-19 ENCOUNTER — Ambulatory Visit (INDEPENDENT_AMBULATORY_CARE_PROVIDER_SITE_OTHER): Payer: Medicare Other | Admitting: Internal Medicine

## 2012-11-19 VITALS — BP 126/72 | HR 78 | Temp 98.0°F | Resp 16 | Ht 61.5 in | Wt 141.8 lb

## 2012-11-19 DIAGNOSIS — Z1239 Encounter for other screening for malignant neoplasm of breast: Secondary | ICD-10-CM | POA: Diagnosis not present

## 2012-11-19 DIAGNOSIS — Z0000 Encounter for general adult medical examination without abnormal findings: Secondary | ICD-10-CM

## 2012-11-19 DIAGNOSIS — B373 Candidiasis of vulva and vagina: Secondary | ICD-10-CM

## 2012-11-19 DIAGNOSIS — Z124 Encounter for screening for malignant neoplasm of cervix: Secondary | ICD-10-CM

## 2012-11-19 MED ORDER — GABAPENTIN 100 MG PO CAPS: ORAL_CAPSULE | ORAL | Status: DC

## 2012-11-19 MED ORDER — TETANUS-DIPHTH-ACELL PERTUSSIS 5-2.5-18.5 LF-MCG/0.5 IM SUSP: 0.5000 mL | Freq: Once | INTRAMUSCULAR | Status: DC

## 2012-11-19 MED ORDER — ZOSTER VACCINE LIVE 19400 UNT/0.65ML ~~LOC~~ SOLR: 0.6500 mL | Freq: Once | SUBCUTANEOUS | Status: DC

## 2012-11-19 MED ORDER — FLUCONAZOLE 150 MG PO TABS: 150.0000 mg | ORAL_TABLET | Freq: Every day | ORAL | Status: DC

## 2012-11-19 NOTE — Patient Instructions (Addendum)
Please consider getting the tetanus-diptheria-pertussis vaccine (TDaP) at your pharmacy this year  Please make an appt (lab) for fasting labs at your earliest convenience.  I have called in fluconazole for your yeast infection (1 pill daily for 2 days)  try the gabapentin for your neuropathy (up to 3 capsules at bedtime)

## 2012-11-19 NOTE — Progress Notes (Signed)
Patient ID: Vanessa Brown, female   DOB: 1941-09-02, 72 y.o.   MRN: [id number]    The patient is here for her annual Medicare wellness examination and management of other chronic problems.   No new issues.  Still having back pain, which is chronic and long standing   Radiates to left side. Has known DDD with probable s nerve root impingement by 2010 MRI  But  wants to avoid surgery,  Using only tylenol.  Neuropathy with burning of feet and "toilet seat"  neuropathy per Dr. Sherryll Burger.  had adverse effect to tramadol ,  Gabapentin for the back issue which did nothing  But has not tried it for the neuropathy.  Still exercising several times per week The burning feet is aggravated by treadmill and standing. Still playing golf.  Wonders if she has fibromyalgia since former PCP suggested this diagnosis as well and referred her to [facility] but she never went.  Had mammogram yesterday.  Hysterectomy at age 34 for heavy bleeding and PID.  The risk factors are reflected in the social history.  The roster of all physicians providing medical care to patient - is listed in the Snapshot section of the chart.  Activities of daily living:  The patient is 100% independent in all ADLs: dressing, toileting, feeding as well as independent mobility  Home safety : The patient has smoke detectors in the home. They wear seatbelts.  There are no firearms at home. There is no violence in the home.   There is no risks for hepatitis, STDs or HIV. There is no   history of blood transfusion. They have no travel history to infectious disease endemic areas of the world.  The patient has seen their dentist in the last six month. They have seen their eye doctor in the last year. They admit to slight hearing difficulty with regard to whispered voices and some television programs.  They have deferred audiologic testing in the last year.  They do not  have excessive sun exposure. Discussed the need for sun protection: hats, long sleeves and use of  sunscreen if there is significant sun exposure.   Diet: the importance of a healthy diet is discussed. They do have a healthy diet.  The benefits of regular aerobic exercise were discussed. She walks 4 times per week ,  20 minutes.   Depression screen: there are no signs or vegative symptoms of depression- irritability, change in appetite, anhedonia, sadness/tearfullness.  Cognitive assessment: the patient manages all their financial and personal affairs and is actively engaged. They could relate day,date,year and events; recalled 2/3 objects at 3 minutes; performed clock-face test normally.  The following portions of the patient's history were reviewed and updated as appropriate: allergies, current medications, past family history, past medical history,  past surgical history, past social history  and problem list.  Visual acuity was not assessed per patient preference since she has regular follow up with her ophthalmologist. Hearing and body mass index were assessed and reviewed.   During the course of the visit the patient was educated and counseled about appropriate screening and preventive services including : fall prevention , diabetes screening, nutrition counseling, colorectal cancer screening, and recommended immunizations.    Objective  BP 126/72  Pulse 78  Temp 98 F (36.7 C) (Oral)  Resp 16  Ht 5' 1.5" (1.562 m)  Wt 141 lb 12 oz (64.297 kg)  BMI 26.35 kg/m2  SpO2 97% General appearance: alert, cooperative and appears stated age Eyes: conjunctivae/corneas  clear. PERRL, EOM's intact. Fundi benign. Ears: normal TM's and external ear canals both ears Throat: lips, mucosa, and tongue normal; teeth and gums normal Neck: no adenopathy, no carotid bruit, no JVD, supple, symmetrical, trachea midline and thyroid not enlarged, symmetric, no tenderness/mass/nodules Lungs: clear to auscultation bilaterally Breasts: normal appearance, no masses or tenderness Heart: regular rate and  rhythm, S1, S2 normal, no murmur, click, rub or gallop Abdomen: soft, non-tender; bowel sounds normal; no masses,  no organomegaly Pelvic: adnexae not palpable, external genitalia normal, no adnexal masses or tenderness, no bladder tenderness, positive findings: vaginal discharge:  white and thin, rectovaginal septum normal, urethra without abnormality or discharge, uterus surgically absent and vagina normal without discharge Extremities: extremities normal, atraumatic, no cyanosis or edema Pulses: 2+ and symmetric Skin: Skin color, texture, turgor normal. No rashes or lesions Lymph nodes: Cervical, supraclavicular, and axillary nodes normal. Neurologic: Alert and oriented X 3, normal strength and tone. Normal symmetric reflexes. Normal coordination and gait  Assessment and Plan  Candidiasis of vagina Suspected by pelvic exam done today   Routine general medical examination at a health care facility Annual comprehensive exam was done including breast, pelvic exam.  All screenings and immunizations have been addressed and recommended.    Screening for breast cancer continue annual mammograms and breast exams,  Done today .   Updated Medication List Outpatient Encounter Prescriptions as of 11/19/2012  Medication Sig Dispense Refill  . calcium carbonate (OS-CAL) 600 MG TABS Take 600 mg by mouth 2 (two) times daily with a meal.        . cloNIDine (CATAPRES) 0.2 MG tablet take 1 tablet by mouth at bedtime  30 tablet  11  . estradiol (CLIMARA) 0.025 mg/24hr Place 1 patch (0.025 mg total) onto the skin once a week.  4 patch  12  . omeprazole (PRILOSEC) 40 MG capsule Take 40 mg by mouth 2 (two) times daily as needed.        . primidone (MYSOLINE) 50 MG tablet Take 50 mg by mouth 2 (two) times daily.      . fluconazole (DIFLUCAN) 150 MG tablet Take 1 tablet (150 mg total) by mouth daily.  2 tablet  0  . gabapentin (NEURONTIN) 100 MG capsule 1 to 3 caplets at bedtime as needed  90 capsule  3  .  TDaP (BOOSTRIX) 5-2.5-18.5 LF-MCG/0.5 injection Inject 0.5 mLs into the muscle once.  0.5 mL  0  . zoster vaccine live, PF, (ZOSTAVAX) 16109 UNT/0.65ML injection Inject 19,400 Units into the skin once.  1 vial  0

## 2012-11-21 DIAGNOSIS — Z0000 Encounter for general adult medical examination without abnormal findings: Secondary | ICD-10-CM | POA: Insufficient documentation

## 2012-11-21 DIAGNOSIS — B373 Candidiasis of vulva and vagina: Secondary | ICD-10-CM | POA: Insufficient documentation

## 2012-11-21 NOTE — Assessment & Plan Note (Signed)
continue annual mammograms and breast exams,  Done today .

## 2012-11-21 NOTE — Assessment & Plan Note (Signed)
Annual comprehensive exam was done including breast, pelvic exam.  All screenings and immunizations have been addressed and recommended.

## 2012-11-21 NOTE — Assessment & Plan Note (Signed)
Suspected by pelvic exam done today

## 2012-11-29 ENCOUNTER — Telehealth: Payer: Self-pay | Admitting: *Deleted

## 2012-11-29 DIAGNOSIS — R5381 Other malaise: Secondary | ICD-10-CM

## 2012-11-29 DIAGNOSIS — E785 Hyperlipidemia, unspecified: Secondary | ICD-10-CM

## 2012-11-29 DIAGNOSIS — E559 Vitamin D deficiency, unspecified: Secondary | ICD-10-CM

## 2012-11-29 NOTE — Telephone Encounter (Signed)
Labs are in, thanks.

## 2012-11-29 NOTE — Addendum Note (Signed)
Addended by: Sherlene Shams on: 11/29/2012 05:59 PM   Modules accepted: Orders

## 2012-11-29 NOTE — Telephone Encounter (Signed)
Pt is coming in for labs tomorrow (11-29-2012) what labs and dx would you like? Thank you

## 2012-11-30 ENCOUNTER — Other Ambulatory Visit (INDEPENDENT_AMBULATORY_CARE_PROVIDER_SITE_OTHER): Payer: Medicare Other

## 2012-11-30 DIAGNOSIS — E785 Hyperlipidemia, unspecified: Secondary | ICD-10-CM

## 2012-11-30 DIAGNOSIS — E559 Vitamin D deficiency, unspecified: Secondary | ICD-10-CM

## 2012-11-30 DIAGNOSIS — R5381 Other malaise: Secondary | ICD-10-CM

## 2012-11-30 DIAGNOSIS — R5383 Other fatigue: Secondary | ICD-10-CM | POA: Diagnosis not present

## 2012-11-30 LAB — CBC WITH DIFFERENTIAL/PLATELET
Basophils Absolute: 0.1 10*3/uL (ref 0.0–0.1)
Eosinophils Absolute: 0.2 10*3/uL (ref 0.0–0.7)
Eosinophils Relative: 3.6 % (ref 0.0–5.0)
MCV: 99.7 fl (ref 78.0–100.0)
Monocytes Absolute: 0.5 10*3/uL (ref 0.1–1.0)
Neutrophils Relative %: 39.4 % — ABNORMAL LOW (ref 43.0–77.0)
Platelets: 327 10*3/uL (ref 150.0–400.0)
RDW: 14.4 % (ref 11.5–14.6)
WBC: 5.8 10*3/uL (ref 4.5–10.5)

## 2012-11-30 LAB — LIPID PANEL
HDL: 74.5 mg/dL (ref >39.00)
VLDL: 10.2 mg/dL (ref 0.0–40.0)

## 2012-11-30 LAB — LDL CHOLESTEROL, DIRECT: Direct LDL: 113.1 mg/dL

## 2012-11-30 LAB — COMPREHENSIVE METABOLIC PANEL
ALT: 20 U/L (ref 0–35)
AST: 21 U/L (ref 0–37)
CO2: 28 mEq/L (ref 19–32)
Creatinine, Ser: 0.8 mg/dL (ref 0.4–1.2)
GFR: 75.96 mL/min (ref >60.00)
Total Bilirubin: 0.5 mg/dL (ref 0.3–1.2)

## 2012-12-01 ENCOUNTER — Encounter: Payer: Self-pay | Admitting: Internal Medicine

## 2012-12-12 IMAGING — US US ABDOMEN COMPLETE
1 series · 14 of 25 positions shown · non-contrast
Comparison: Ultrasound dated 10/30/2005 and CT scan of the abdomen
and pelvis dated 07/05/2010

CLINICAL DATA: Abdominal pain.

COMPLETE ABDOMINAL ULTRASOUND

[Series 1: us abdomen complete · 0.24mm/px · 14 of 60 slices shown]
[im 1/60]
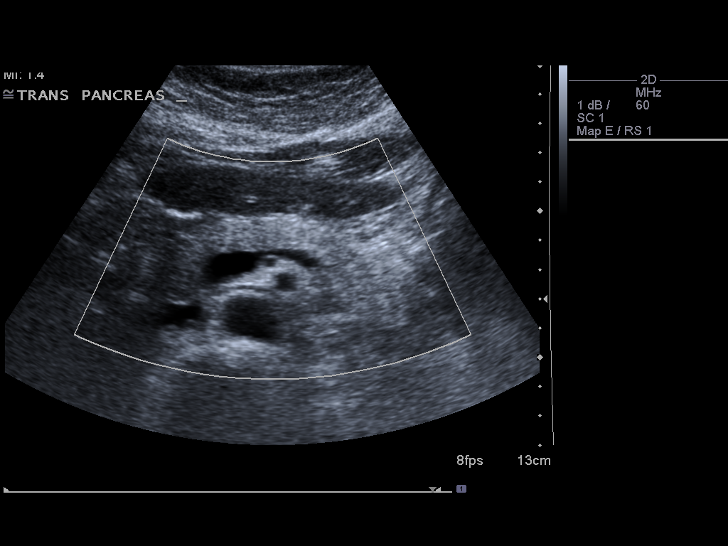
[im 5/60]
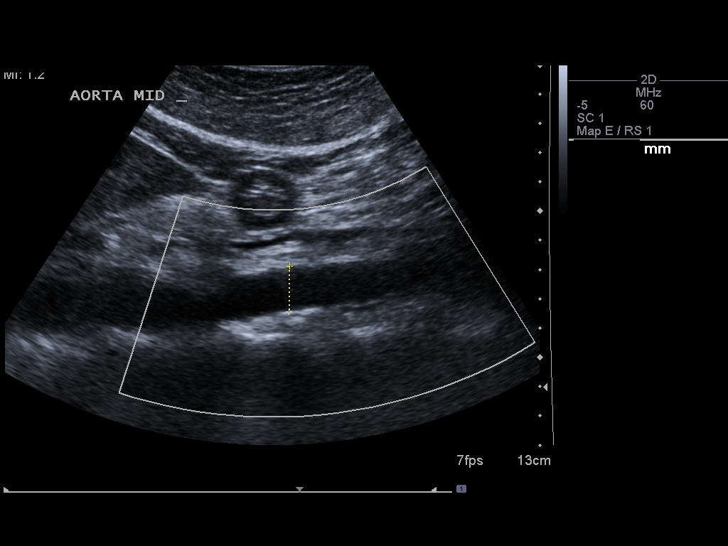
[im 10/60]
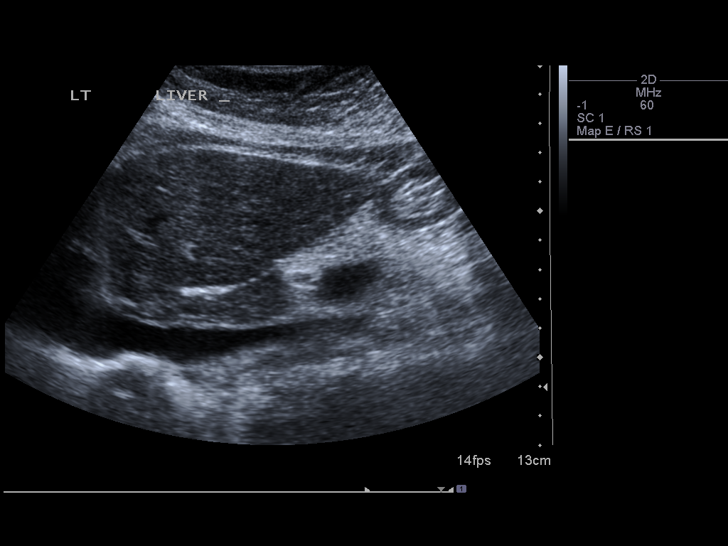
[im 15/60]
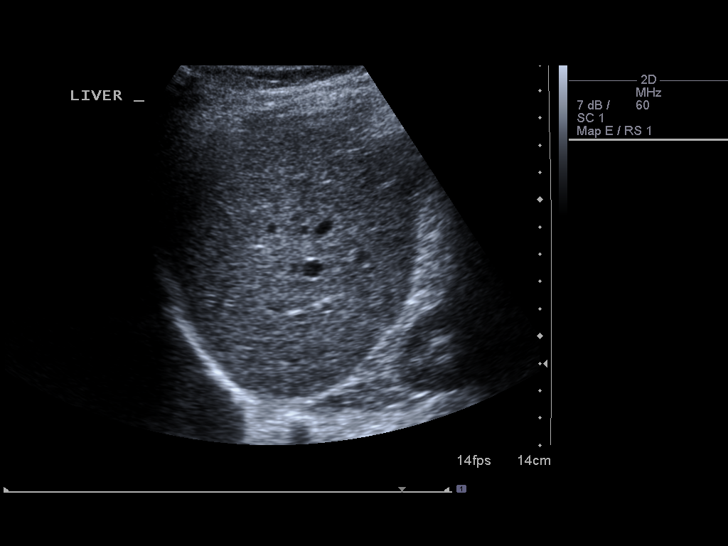
[im 20/60]
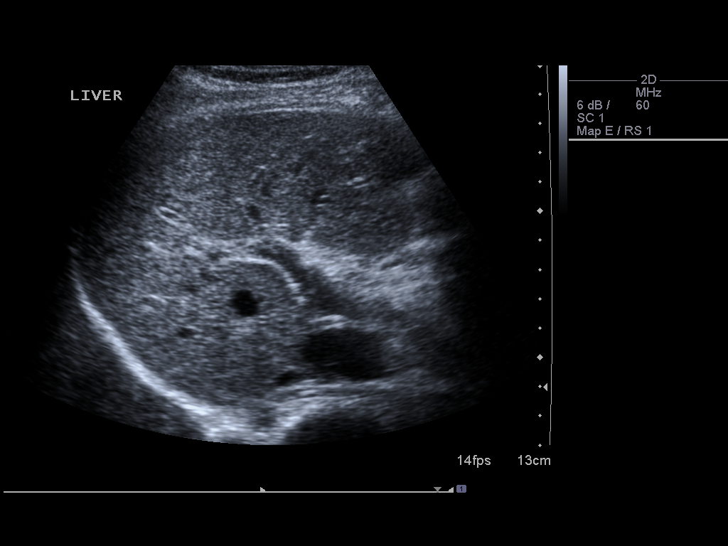
[im 23/60]
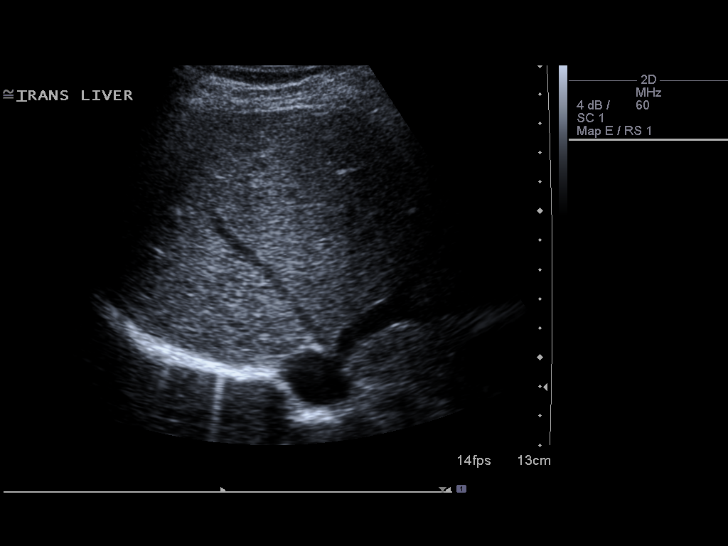
[im 28/60]
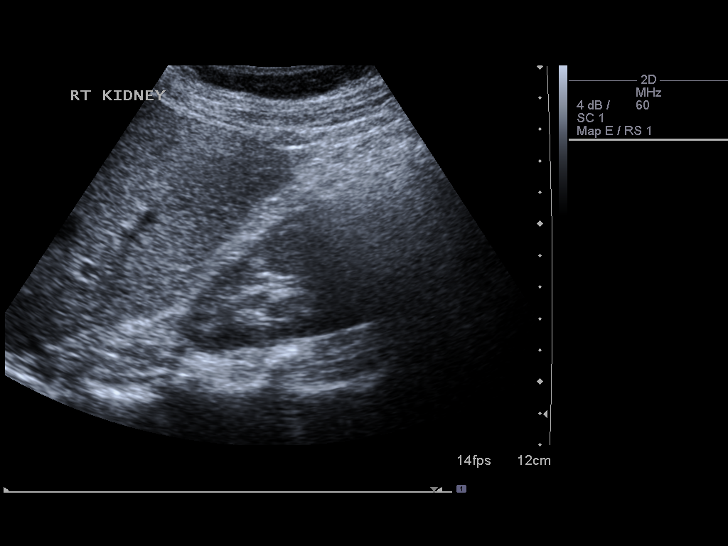
[im 32/60]
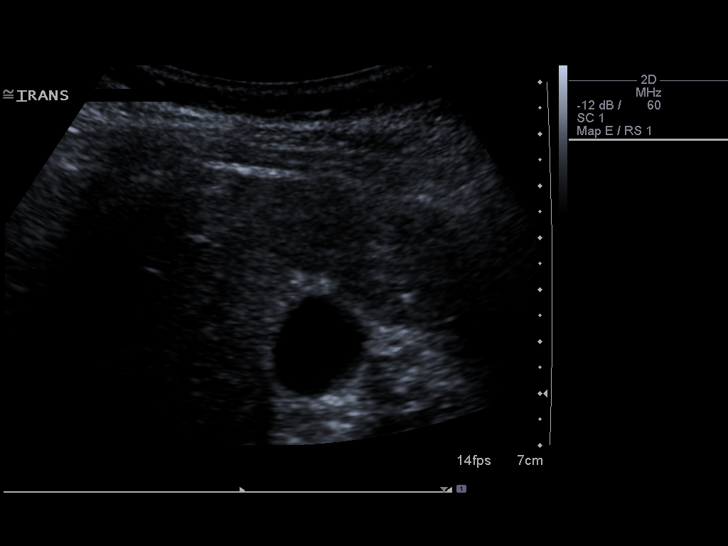
[im 37/60]
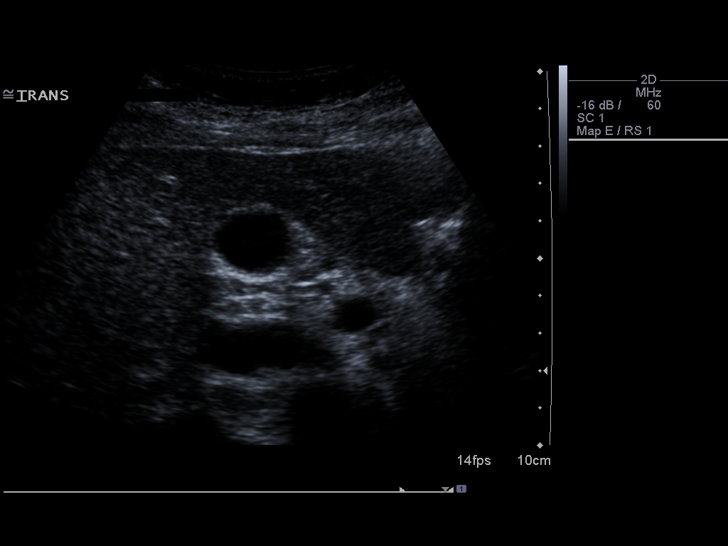
[im 40/60]
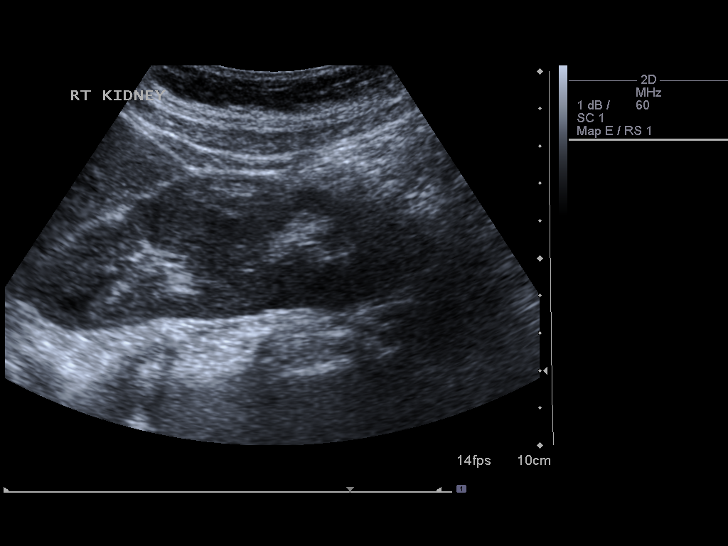
[im 45/60]
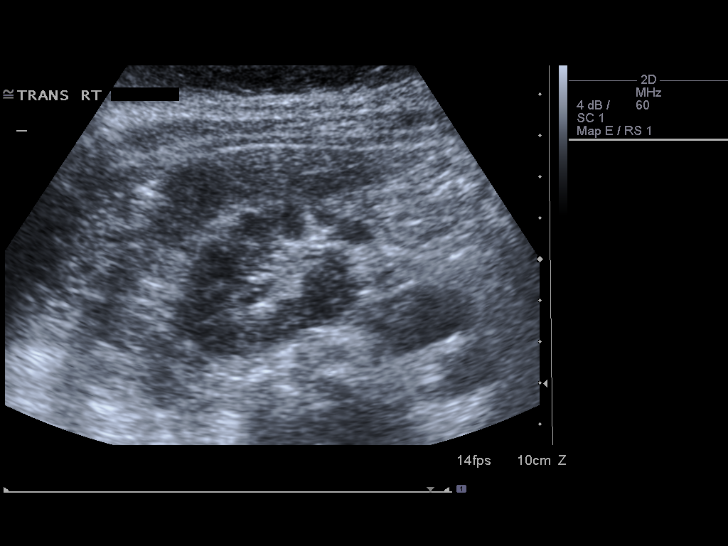
[im 50/60]
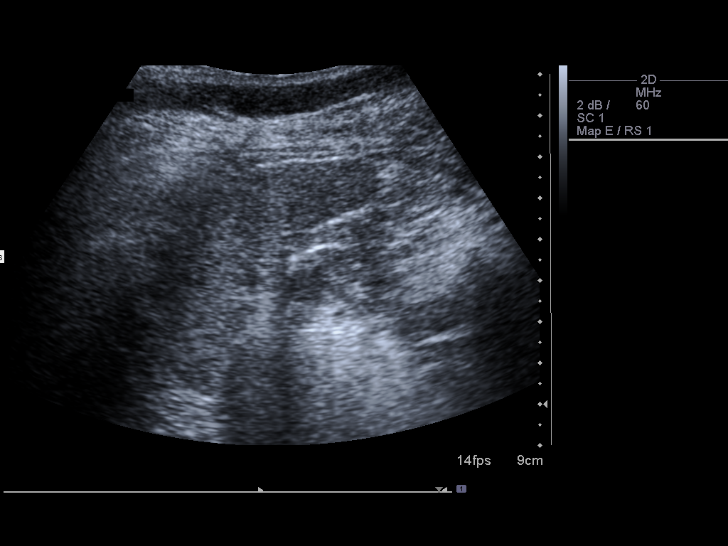
[im 55/60]
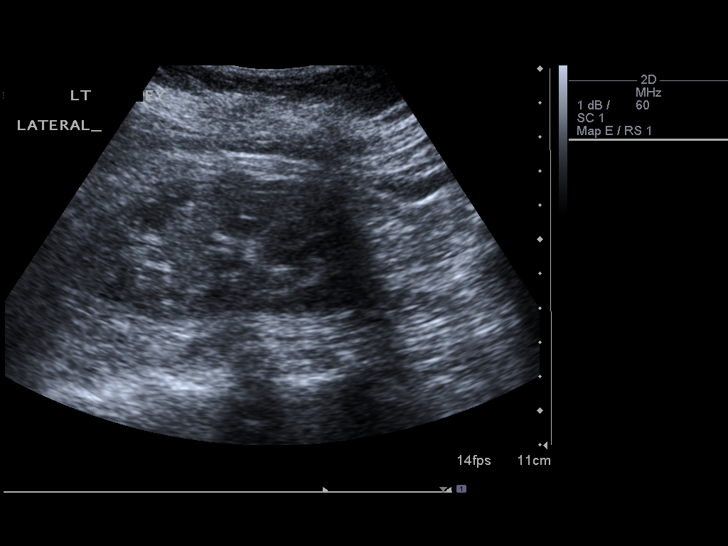
[im 60/60]
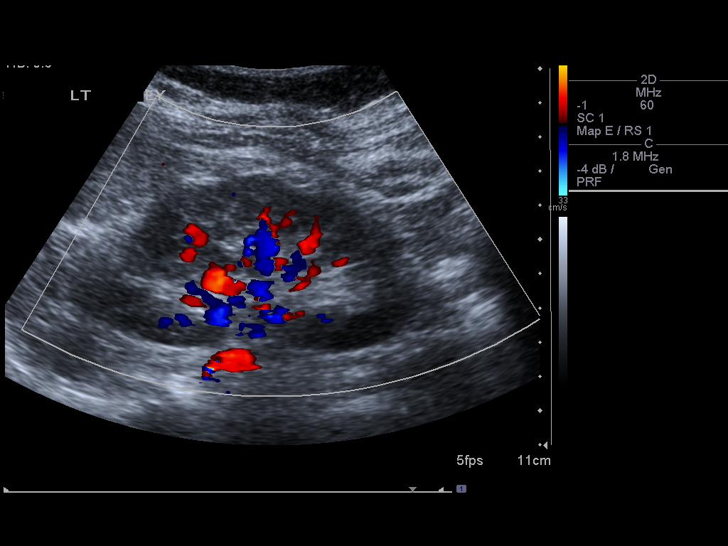

[14 of 25 positions shown; findings below may reference images not displayed]

FINDINGS: Gallbladder:  There are no gallstones and no gallbladder wall
thickening.  Negative sonographic Murphy's sign.

Common bile duct:  Normal.  3.7 mm in diameter.

Liver:  No focal lesion identified.  Within normal limits in
parenchymal echogenicity.

IVC:  Appears normal.

Pancreas:  Normal.

Spleen:  Normal.  5.3 cm in length.

Right Kidney:  10.9 cm in length.  Slight congenital malrotation of
the right kidney as demonstrated on the prior CT scan.

Left Kidney:  Normal.  9.8 cm in length.

Abdominal aorta:  Normal.  2.3 cm maximum diameter.
IMPRESSION: No significant abnormality.  Congenital malrotation of the right
kidney.

## 2012-12-14 ENCOUNTER — Encounter: Payer: Self-pay | Admitting: General Practice

## 2013-02-23 DIAGNOSIS — H2510 Age-related nuclear cataract, unspecified eye: Secondary | ICD-10-CM | POA: Diagnosis not present

## 2013-03-07 DIAGNOSIS — G250 Essential tremor: Secondary | ICD-10-CM | POA: Diagnosis not present

## 2013-03-07 DIAGNOSIS — G608 Other hereditary and idiopathic neuropathies: Secondary | ICD-10-CM | POA: Diagnosis not present

## 2013-03-07 DIAGNOSIS — G252 Other specified forms of tremor: Secondary | ICD-10-CM | POA: Diagnosis not present

## 2013-05-06 DIAGNOSIS — H10019 Acute follicular conjunctivitis, unspecified eye: Secondary | ICD-10-CM | POA: Diagnosis not present

## 2013-05-25 ENCOUNTER — Other Ambulatory Visit: Payer: Self-pay

## 2013-07-04 DIAGNOSIS — H60509 Unspecified acute noninfective otitis externa, unspecified ear: Secondary | ICD-10-CM | POA: Diagnosis not present

## 2013-07-04 DIAGNOSIS — H9209 Otalgia, unspecified ear: Secondary | ICD-10-CM | POA: Diagnosis not present

## 2013-07-11 DIAGNOSIS — J305 Allergic rhinitis due to food: Secondary | ICD-10-CM | POA: Diagnosis not present

## 2013-07-26 DIAGNOSIS — Z23 Encounter for immunization: Secondary | ICD-10-CM | POA: Diagnosis not present

## 2013-08-25 ENCOUNTER — Other Ambulatory Visit: Payer: Self-pay

## 2013-09-12 DIAGNOSIS — J301 Allergic rhinitis due to pollen: Secondary | ICD-10-CM | POA: Diagnosis not present

## 2013-10-25 ENCOUNTER — Other Ambulatory Visit: Payer: Self-pay | Admitting: *Deleted

## 2013-10-25 DIAGNOSIS — Z79890 Hormone replacement therapy (postmenopausal): Secondary | ICD-10-CM

## 2013-10-25 MED ORDER — ESTRADIOL 0.025 MG/24HR TD PTWK: 0.0250 mg | MEDICATED_PATCH | TRANSDERMAL | Status: DC

## 2013-10-25 NOTE — Telephone Encounter (Signed)
Appt 11/21/13.

## 2013-10-27 ENCOUNTER — Telehealth: Payer: Self-pay | Admitting: Internal Medicine
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
## 2013-10-31 ENCOUNTER — Ambulatory Visit: Payer: Medicare Other | Admitting: Internal Medicine

## 2013-11-21 ENCOUNTER — Ambulatory Visit: Payer: Self-pay | Admitting: Internal Medicine

## 2013-11-21 ENCOUNTER — Ambulatory Visit (INDEPENDENT_AMBULATORY_CARE_PROVIDER_SITE_OTHER): Payer: Medicare Other | Admitting: Internal Medicine

## 2013-11-21 ENCOUNTER — Encounter: Payer: Self-pay | Admitting: Internal Medicine

## 2013-11-21 ENCOUNTER — Telehealth: Payer: Self-pay | Admitting: Emergency Medicine

## 2013-11-21 VITALS — BP 120/80 | HR 75 | Temp 97.7°F | Resp 18 | Ht 61.5 in | Wt 142.0 lb

## 2013-11-21 DIAGNOSIS — R51 Headache: Secondary | ICD-10-CM | POA: Diagnosis not present

## 2013-11-21 DIAGNOSIS — Z1231 Encounter for screening mammogram for malignant neoplasm of breast: Secondary | ICD-10-CM | POA: Diagnosis not present

## 2013-11-21 DIAGNOSIS — F411 Generalized anxiety disorder: Secondary | ICD-10-CM

## 2013-11-21 DIAGNOSIS — E785 Hyperlipidemia, unspecified: Secondary | ICD-10-CM | POA: Diagnosis not present

## 2013-11-21 DIAGNOSIS — E559 Vitamin D deficiency, unspecified: Secondary | ICD-10-CM

## 2013-11-21 DIAGNOSIS — R5381 Other malaise: Secondary | ICD-10-CM | POA: Diagnosis not present

## 2013-11-21 DIAGNOSIS — M542 Cervicalgia: Secondary | ICD-10-CM

## 2013-11-21 DIAGNOSIS — R5383 Other fatigue: Secondary | ICD-10-CM

## 2013-11-21 DIAGNOSIS — Z0000 Encounter for general adult medical examination without abnormal findings: Secondary | ICD-10-CM | POA: Diagnosis not present

## 2013-11-21 DIAGNOSIS — Z79899 Other long term (current) drug therapy: Secondary | ICD-10-CM

## 2013-11-21 DIAGNOSIS — N760 Acute vaginitis: Secondary | ICD-10-CM

## 2013-11-21 DIAGNOSIS — G250 Essential tremor: Secondary | ICD-10-CM

## 2013-11-21 DIAGNOSIS — M5090 Cervical disc disorder, unspecified, unspecified cervical region: Secondary | ICD-10-CM | POA: Diagnosis not present

## 2013-11-21 DIAGNOSIS — Z1239 Encounter for other screening for malignant neoplasm of breast: Secondary | ICD-10-CM

## 2013-11-21 DIAGNOSIS — R519 Headache, unspecified: Secondary | ICD-10-CM

## 2013-11-21 DIAGNOSIS — Z79890 Hormone replacement therapy (postmenopausal): Secondary | ICD-10-CM

## 2013-11-21 DIAGNOSIS — K589 Irritable bowel syndrome without diarrhea: Secondary | ICD-10-CM

## 2013-11-21 LAB — LIPID PANEL
CHOL/HDL RATIO: 3
CHOLESTEROL: 251 mg/dL — AB (ref 0–200)
HDL: 81.5 mg/dL (ref >39.00)
Triglycerides: 93 mg/dL (ref 0.0–149.0)
VLDL: 18.6 mg/dL (ref 0.0–40.0)

## 2013-11-21 LAB — COMPREHENSIVE METABOLIC PANEL
ALBUMIN: 4 g/dL (ref 3.5–5.2)
ALK PHOS: 96 U/L (ref 39–117)
ALT: 22 U/L (ref 0–35)
AST: 22 U/L (ref 0–37)
BUN: 13 mg/dL (ref 6–23)
CO2: 27 mEq/L (ref 19–32)
Calcium: 9.6 mg/dL (ref 8.4–10.5)
Chloride: 107 mEq/L (ref 96–112)
Creatinine, Ser: 0.7 mg/dL (ref 0.4–1.2)
GFR: 91.62 mL/min (ref >60.00)
Glucose, Bld: 85 mg/dL (ref 70–99)
POTASSIUM: 4.7 meq/L (ref 3.5–5.1)
Sodium: 140 mEq/L (ref 135–145)
Total Bilirubin: 0.4 mg/dL (ref 0.3–1.2)
Total Protein: 7.1 g/dL (ref 6.0–8.3)

## 2013-11-21 LAB — CBC WITH DIFFERENTIAL/PLATELET
BASOS ABS: 0 10*3/uL (ref 0.0–0.1)
Basophils Relative: 0.7 % (ref 0.0–3.0)
EOS ABS: 0.5 10*3/uL (ref 0.0–0.7)
Eosinophils Relative: 7.6 % — ABNORMAL HIGH (ref 0.0–5.0)
HCT: 40 % (ref 36.0–46.0)
Hemoglobin: 13.1 g/dL (ref 12.0–15.0)
Lymphocytes Relative: 43.2 % (ref 12.0–46.0)
Lymphs Abs: 2.8 10*3/uL (ref 0.7–4.0)
MCHC: 32.6 g/dL (ref 30.0–36.0)
MCV: 103 fl — AB (ref 78.0–100.0)
Monocytes Absolute: 0.6 10*3/uL (ref 0.1–1.0)
Monocytes Relative: 9.2 % (ref 3.0–12.0)
NEUTROS PCT: 39.3 % — AB (ref 43.0–77.0)
Neutro Abs: 2.6 10*3/uL (ref 1.4–7.7)
Platelets: 330 10*3/uL (ref 150.0–400.0)
RBC: 3.89 Mil/uL (ref 3.87–5.11)
RDW: 15.1 % — AB (ref 11.5–14.6)
WBC: 6.5 10*3/uL (ref 4.5–10.5)

## 2013-11-21 LAB — LDL CHOLESTEROL, DIRECT: Direct LDL: 149.9 mg/dL

## 2013-11-21 LAB — TSH: TSH: 4.3 u[IU]/mL (ref 0.35–5.50)

## 2013-11-21 MED ORDER — ESTRADIOL 0.025 MG/24HR TD PTWK: 0.0250 mg | MEDICATED_PATCH | TRANSDERMAL | Status: DC

## 2013-11-21 MED ORDER — FLUCONAZOLE 150 MG PO TABS: 150.0000 mg | ORAL_TABLET | Freq: Every day | ORAL | Status: DC

## 2013-11-21 MED ORDER — ALPRAZOLAM 0.5 MG PO TABS: 0.5000 mg | ORAL_TABLET | Freq: Two times a day (BID) | ORAL | Status: DC | PRN

## 2013-11-21 NOTE — Telephone Encounter (Signed)
Patient is having MRI on Friday at 1145. Can we please call her in something to help her relax for this procedure.

## 2013-11-21 NOTE — Telephone Encounter (Signed)
Spoke to pt told her Rx for Alprazolam was faxed to pharmacy and to take first dose 30 minutes prior to procedure. Pt verbalized understanding. Rx faxed.

## 2013-11-21 NOTE — Progress Notes (Signed)
Pre-visit discussion using our clinic review tool. No additional management support is needed unless otherwise documented below in the visit note.  

## 2013-11-21 NOTE — Telephone Encounter (Signed)
Alprazolam rx to be sent,  Take first dose 30 minutes prior to procedure
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
## 2013-11-21 NOTE — Patient Instructions (Signed)
Trial of soft cervical collar for relief of neck pain and headaches  MRI cervical spine recommended    Trial of fluconazole for vaginitis /yeast.  If no improvement,  Please return for pelvic exam

## 2013-11-22 LAB — HM MAMMOGRAPHY: HM Mammogram: NORMAL

## 2013-11-22 LAB — VITAMIN D 25 HYDROXY (VIT D DEFICIENCY, FRACTURES): Vit D, 25-Hydroxy: 67 ng/mL (ref 30–89)

## 2013-11-23 ENCOUNTER — Encounter: Payer: Self-pay | Admitting: Internal Medicine

## 2013-11-23 DIAGNOSIS — F411 Generalized anxiety disorder: Secondary | ICD-10-CM | POA: Insufficient documentation

## 2013-11-23 NOTE — Assessment & Plan Note (Signed)
Aggravated by recent stress of moving.  Prn allprazolam discussed for management of occasional insomnia and anxiety.

## 2013-11-23 NOTE — Assessment & Plan Note (Addendum)
Symptoms of itching were aggravated by recent use of antibiotics,  Trial of diflucan.  If no improvement return for pelvic exam.
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
## 2013-11-23 NOTE — Assessment & Plan Note (Signed)
Screening mammogram is due and has been ordered.

## 2013-11-23 NOTE — Assessment & Plan Note (Signed)
Aggravated by recent stressors.  Denies hematochezia and abdominal pain.

## 2013-11-23 NOTE — Assessment & Plan Note (Signed)
Annual comprehensive exam was done excluding breast, pelvic and PAP smear. All screenings have been addressed .  

## 2013-11-23 NOTE — Assessment & Plan Note (Signed)
Non progressive, managed with primidone

## 2013-11-24 ENCOUNTER — Telehealth: Payer: Self-pay | Admitting: *Deleted

## 2013-11-24 ENCOUNTER — Telehealth: Payer: Self-pay | Admitting: Internal Medicine

## 2013-11-24 ENCOUNTER — Encounter: Payer: Self-pay | Admitting: Internal Medicine

## 2013-11-24 DIAGNOSIS — E785 Hyperlipidemia, unspecified: Secondary | ICD-10-CM | POA: Insufficient documentation

## 2013-11-24 NOTE — Telephone Encounter (Signed)
Pt called she has mri tomorrow and wanted to know if she could get something to calm her down before the test [facility] [address] in [place] [facility] told pt that she was going to fax something over for your approval

## 2013-11-24 NOTE — Telephone Encounter (Signed)
Patient is going for MRI at [facility] 11/25/13 and would like something called in to help her relax during procedure patient stated she has claustrophobia. Please advise.

## 2013-11-24 NOTE — Telephone Encounter (Signed)
Refill Request  Diazepam 5 mg tablet   Take one tablet by mouth 1 hour prior to MRI, then bring other tablet to office with you

## 2013-11-24 NOTE — Telephone Encounter (Signed)
Not sure why she is requesting this, please advise.

## 2013-11-24 NOTE — Telephone Encounter (Signed)
Notified patient script sent to pharmacy.

## 2013-11-25 ENCOUNTER — Ambulatory Visit: Payer: Self-pay | Admitting: Internal Medicine

## 2013-11-25 NOTE — Telephone Encounter (Signed)
Valium denied,  Because alprazolam rx was sent several days ago to use prior to MRI

## 2013-11-27 ENCOUNTER — Telehealth: Payer: Self-pay | Admitting: Internal Medicine

## 2013-11-27 DIAGNOSIS — M5090 Cervical disc disorder, unspecified, unspecified cervical region: Secondary | ICD-10-CM

## 2013-11-27 NOTE — Assessment & Plan Note (Signed)
MRI Feb 2015; progressive cervical spondylosis and DDD with prominent impingement at C3 -6 all levels,moderate impingement at c6-7 and mild impingement at C2-3.  Worsened compared to 2006 MRI

## 2013-11-27 NOTE — Telephone Encounter (Signed)
Her MRI of cervical spine showed worsening spinal stenosis at multiple levels.  If her pain is not controlled or if she is having weakness in either arm I would recommend seeing a neurosurgeon

## 2013-11-28 NOTE — Telephone Encounter (Signed)
Mailed unread message to pt  
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
## 2013-11-29 ENCOUNTER — Encounter: Payer: Self-pay | Admitting: *Deleted

## 2013-11-29 NOTE — Telephone Encounter (Signed)
Faxed copy of MRI to Chiropractor, faxed [phone].and mailed copy to patient

## 2013-12-01 ENCOUNTER — Telehealth: Payer: Self-pay | Admitting: *Deleted

## 2013-12-01 NOTE — Telephone Encounter (Signed)
Yes, it started out as a wellness visit, but because we also dealt with acute and chronic problems there will be additional charges for the management of those problems.  She only sees me once a year so she had a lot of things to discuss

## 2013-12-01 NOTE — Telephone Encounter (Signed)
Patient would like to know if her visit 11/24/13 was her medicare wellness she stated that what she had set it up as. After viewing the AVS it staed General wellness visit please advise.

## 2013-12-02 NOTE — Telephone Encounter (Signed)
Patient notifed

## 2013-12-19 ENCOUNTER — Encounter: Payer: Self-pay | Admitting: Internal Medicine

## 2014-02-19 LAB — HM COLONOSCOPY

## 2014-02-27 DIAGNOSIS — Z86010 Personal history of colonic polyps: Secondary | ICD-10-CM | POA: Diagnosis not present

## 2014-02-27 DIAGNOSIS — Z09 Encounter for follow-up examination after completed treatment for conditions other than malignant neoplasm: Secondary | ICD-10-CM | POA: Diagnosis not present

## 2014-03-06 DIAGNOSIS — G252 Other specified forms of tremor: Secondary | ICD-10-CM | POA: Diagnosis not present

## 2014-03-06 DIAGNOSIS — G250 Essential tremor: Secondary | ICD-10-CM | POA: Diagnosis not present

## 2014-03-06 DIAGNOSIS — G589 Mononeuropathy, unspecified: Secondary | ICD-10-CM | POA: Diagnosis not present

## 2014-03-23 ENCOUNTER — Telehealth: Payer: Self-pay | Admitting: Internal Medicine
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
## 2014-04-26 DIAGNOSIS — L578 Other skin changes due to chronic exposure to nonionizing radiation: Secondary | ICD-10-CM | POA: Diagnosis not present

## 2014-06-07 DIAGNOSIS — H2510 Age-related nuclear cataract, unspecified eye: Secondary | ICD-10-CM | POA: Diagnosis not present

## 2014-07-20 DIAGNOSIS — H2000 Unspecified acute and subacute iridocyclitis: Secondary | ICD-10-CM | POA: Diagnosis not present

## 2014-07-24 DIAGNOSIS — Z23 Encounter for immunization: Secondary | ICD-10-CM | POA: Diagnosis not present

## 2014-08-16 DIAGNOSIS — H2000 Unspecified acute and subacute iridocyclitis: Secondary | ICD-10-CM | POA: Diagnosis not present

## 2014-09-04 DIAGNOSIS — M9904 Segmental and somatic dysfunction of sacral region: Secondary | ICD-10-CM | POA: Diagnosis not present

## 2014-09-04 DIAGNOSIS — M5441 Lumbago with sciatica, right side: Secondary | ICD-10-CM | POA: Diagnosis not present

## 2014-09-04 DIAGNOSIS — M9903 Segmental and somatic dysfunction of lumbar region: Secondary | ICD-10-CM | POA: Diagnosis not present

## 2014-09-04 DIAGNOSIS — M5136 Other intervertebral disc degeneration, lumbar region: Secondary | ICD-10-CM | POA: Diagnosis not present

## 2014-09-04 DIAGNOSIS — M5442 Lumbago with sciatica, left side: Secondary | ICD-10-CM | POA: Diagnosis not present

## 2014-09-04 DIAGNOSIS — M791 Myalgia: Secondary | ICD-10-CM | POA: Diagnosis not present

## 2014-09-04 DIAGNOSIS — M545 Low back pain: Secondary | ICD-10-CM | POA: Diagnosis not present

## 2014-09-04 DIAGNOSIS — M5137 Other intervertebral disc degeneration, lumbosacral region: Secondary | ICD-10-CM | POA: Diagnosis not present

## 2014-09-06 DIAGNOSIS — M5442 Lumbago with sciatica, left side: Secondary | ICD-10-CM | POA: Diagnosis not present

## 2014-09-06 DIAGNOSIS — M791 Myalgia: Secondary | ICD-10-CM | POA: Diagnosis not present

## 2014-09-06 DIAGNOSIS — M9904 Segmental and somatic dysfunction of sacral region: Secondary | ICD-10-CM | POA: Diagnosis not present

## 2014-09-06 DIAGNOSIS — M5136 Other intervertebral disc degeneration, lumbar region: Secondary | ICD-10-CM | POA: Diagnosis not present

## 2014-09-06 DIAGNOSIS — M9903 Segmental and somatic dysfunction of lumbar region: Secondary | ICD-10-CM | POA: Diagnosis not present

## 2014-09-06 DIAGNOSIS — M545 Low back pain: Secondary | ICD-10-CM | POA: Diagnosis not present

## 2014-09-06 DIAGNOSIS — M5137 Other intervertebral disc degeneration, lumbosacral region: Secondary | ICD-10-CM | POA: Diagnosis not present

## 2014-09-06 DIAGNOSIS — M5441 Lumbago with sciatica, right side: Secondary | ICD-10-CM | POA: Diagnosis not present

## 2014-09-07 DIAGNOSIS — M9904 Segmental and somatic dysfunction of sacral region: Secondary | ICD-10-CM | POA: Diagnosis not present

## 2014-09-07 DIAGNOSIS — M9903 Segmental and somatic dysfunction of lumbar region: Secondary | ICD-10-CM | POA: Diagnosis not present

## 2014-09-07 DIAGNOSIS — M5137 Other intervertebral disc degeneration, lumbosacral region: Secondary | ICD-10-CM | POA: Diagnosis not present

## 2014-09-07 DIAGNOSIS — M545 Low back pain: Secondary | ICD-10-CM | POA: Diagnosis not present

## 2014-09-07 DIAGNOSIS — M5442 Lumbago with sciatica, left side: Secondary | ICD-10-CM | POA: Diagnosis not present

## 2014-09-07 DIAGNOSIS — M791 Myalgia: Secondary | ICD-10-CM | POA: Diagnosis not present

## 2014-09-07 DIAGNOSIS — M5136 Other intervertebral disc degeneration, lumbar region: Secondary | ICD-10-CM | POA: Diagnosis not present

## 2014-09-07 DIAGNOSIS — M5441 Lumbago with sciatica, right side: Secondary | ICD-10-CM | POA: Diagnosis not present

## 2014-09-11 DIAGNOSIS — M5137 Other intervertebral disc degeneration, lumbosacral region: Secondary | ICD-10-CM | POA: Diagnosis not present

## 2014-09-11 DIAGNOSIS — M545 Low back pain: Secondary | ICD-10-CM | POA: Diagnosis not present

## 2014-09-11 DIAGNOSIS — M5136 Other intervertebral disc degeneration, lumbar region: Secondary | ICD-10-CM | POA: Diagnosis not present

## 2014-09-11 DIAGNOSIS — M9903 Segmental and somatic dysfunction of lumbar region: Secondary | ICD-10-CM | POA: Diagnosis not present

## 2014-09-11 DIAGNOSIS — M5442 Lumbago with sciatica, left side: Secondary | ICD-10-CM | POA: Diagnosis not present

## 2014-09-11 DIAGNOSIS — M791 Myalgia: Secondary | ICD-10-CM | POA: Diagnosis not present

## 2014-09-11 DIAGNOSIS — M9904 Segmental and somatic dysfunction of sacral region: Secondary | ICD-10-CM | POA: Diagnosis not present

## 2014-09-11 DIAGNOSIS — M5441 Lumbago with sciatica, right side: Secondary | ICD-10-CM | POA: Diagnosis not present

## 2014-09-12 DIAGNOSIS — M5441 Lumbago with sciatica, right side: Secondary | ICD-10-CM | POA: Diagnosis not present

## 2014-09-12 DIAGNOSIS — M5136 Other intervertebral disc degeneration, lumbar region: Secondary | ICD-10-CM | POA: Diagnosis not present

## 2014-09-12 DIAGNOSIS — M5137 Other intervertebral disc degeneration, lumbosacral region: Secondary | ICD-10-CM | POA: Diagnosis not present

## 2014-09-12 DIAGNOSIS — M9903 Segmental and somatic dysfunction of lumbar region: Secondary | ICD-10-CM | POA: Diagnosis not present

## 2014-09-12 DIAGNOSIS — M9904 Segmental and somatic dysfunction of sacral region: Secondary | ICD-10-CM | POA: Diagnosis not present

## 2014-09-12 DIAGNOSIS — M791 Myalgia: Secondary | ICD-10-CM | POA: Diagnosis not present

## 2014-09-12 DIAGNOSIS — M545 Low back pain: Secondary | ICD-10-CM | POA: Diagnosis not present

## 2014-09-12 DIAGNOSIS — M5442 Lumbago with sciatica, left side: Secondary | ICD-10-CM | POA: Diagnosis not present

## 2014-09-13 DIAGNOSIS — M545 Low back pain: Secondary | ICD-10-CM | POA: Diagnosis not present

## 2014-09-13 DIAGNOSIS — M5137 Other intervertebral disc degeneration, lumbosacral region: Secondary | ICD-10-CM | POA: Diagnosis not present

## 2014-09-13 DIAGNOSIS — M5441 Lumbago with sciatica, right side: Secondary | ICD-10-CM | POA: Diagnosis not present

## 2014-09-13 DIAGNOSIS — M5442 Lumbago with sciatica, left side: Secondary | ICD-10-CM | POA: Diagnosis not present

## 2014-09-13 DIAGNOSIS — M9904 Segmental and somatic dysfunction of sacral region: Secondary | ICD-10-CM | POA: Diagnosis not present

## 2014-09-13 DIAGNOSIS — M9903 Segmental and somatic dysfunction of lumbar region: Secondary | ICD-10-CM | POA: Diagnosis not present

## 2014-09-13 DIAGNOSIS — M5136 Other intervertebral disc degeneration, lumbar region: Secondary | ICD-10-CM | POA: Diagnosis not present

## 2014-09-13 DIAGNOSIS — M791 Myalgia: Secondary | ICD-10-CM | POA: Diagnosis not present

## 2014-09-18 DIAGNOSIS — M9904 Segmental and somatic dysfunction of sacral region: Secondary | ICD-10-CM | POA: Diagnosis not present

## 2014-09-18 DIAGNOSIS — M5441 Lumbago with sciatica, right side: Secondary | ICD-10-CM | POA: Diagnosis not present

## 2014-09-18 DIAGNOSIS — M545 Low back pain: Secondary | ICD-10-CM | POA: Diagnosis not present

## 2014-09-18 DIAGNOSIS — M5136 Other intervertebral disc degeneration, lumbar region: Secondary | ICD-10-CM | POA: Diagnosis not present

## 2014-09-18 DIAGNOSIS — M791 Myalgia: Secondary | ICD-10-CM | POA: Diagnosis not present

## 2014-09-18 DIAGNOSIS — M5442 Lumbago with sciatica, left side: Secondary | ICD-10-CM | POA: Diagnosis not present

## 2014-09-18 DIAGNOSIS — M9903 Segmental and somatic dysfunction of lumbar region: Secondary | ICD-10-CM | POA: Diagnosis not present

## 2014-09-18 DIAGNOSIS — M5137 Other intervertebral disc degeneration, lumbosacral region: Secondary | ICD-10-CM | POA: Diagnosis not present

## 2014-09-20 DIAGNOSIS — M5136 Other intervertebral disc degeneration, lumbar region: Secondary | ICD-10-CM | POA: Diagnosis not present

## 2014-09-20 DIAGNOSIS — M791 Myalgia: Secondary | ICD-10-CM | POA: Diagnosis not present

## 2014-09-20 DIAGNOSIS — M5441 Lumbago with sciatica, right side: Secondary | ICD-10-CM | POA: Diagnosis not present

## 2014-09-20 DIAGNOSIS — M545 Low back pain: Secondary | ICD-10-CM | POA: Diagnosis not present

## 2014-09-20 DIAGNOSIS — M9904 Segmental and somatic dysfunction of sacral region: Secondary | ICD-10-CM | POA: Diagnosis not present

## 2014-09-20 DIAGNOSIS — M9903 Segmental and somatic dysfunction of lumbar region: Secondary | ICD-10-CM | POA: Diagnosis not present

## 2014-09-20 DIAGNOSIS — M5442 Lumbago with sciatica, left side: Secondary | ICD-10-CM | POA: Diagnosis not present

## 2014-09-20 DIAGNOSIS — M5137 Other intervertebral disc degeneration, lumbosacral region: Secondary | ICD-10-CM | POA: Diagnosis not present

## 2014-09-25 ENCOUNTER — Telehealth: Payer: Self-pay | Admitting: Internal Medicine

## 2014-09-25 DIAGNOSIS — M5442 Lumbago with sciatica, left side: Secondary | ICD-10-CM | POA: Diagnosis not present

## 2014-09-25 DIAGNOSIS — M545 Low back pain: Secondary | ICD-10-CM

## 2014-09-25 DIAGNOSIS — M5137 Other intervertebral disc degeneration, lumbosacral region: Secondary | ICD-10-CM | POA: Diagnosis not present

## 2014-09-25 DIAGNOSIS — M5441 Lumbago with sciatica, right side: Secondary | ICD-10-CM | POA: Diagnosis not present

## 2014-09-25 DIAGNOSIS — M9904 Segmental and somatic dysfunction of sacral region: Secondary | ICD-10-CM | POA: Diagnosis not present

## 2014-09-25 DIAGNOSIS — M5136 Other intervertebral disc degeneration, lumbar region: Secondary | ICD-10-CM | POA: Diagnosis not present

## 2014-09-25 DIAGNOSIS — M791 Myalgia: Secondary | ICD-10-CM | POA: Diagnosis not present

## 2014-09-25 DIAGNOSIS — M9903 Segmental and somatic dysfunction of lumbar region: Secondary | ICD-10-CM | POA: Diagnosis not present

## 2014-09-25 NOTE — Telephone Encounter (Signed)
The MRI was of cervical spine,  She asked for referral for lower spine,  Referral is in process to Dr Shirlyn Goltzhesnis,  Does not need to see me for referral

## 2014-09-25 NOTE — Telephone Encounter (Signed)
Patient would like a referral to a Dr. Mack GuiseBenjamin Chasnin with [facility] for lower back problems.

## 2014-09-25 NOTE — Telephone Encounter (Signed)
Patient had MRI of Spine 2/15 will patient need to see MD for referral. Please advise?

## 2014-09-27 DIAGNOSIS — M9903 Segmental and somatic dysfunction of lumbar region: Secondary | ICD-10-CM | POA: Diagnosis not present

## 2014-09-27 DIAGNOSIS — M5442 Lumbago with sciatica, left side: Secondary | ICD-10-CM | POA: Diagnosis not present

## 2014-09-27 DIAGNOSIS — M5441 Lumbago with sciatica, right side: Secondary | ICD-10-CM | POA: Diagnosis not present

## 2014-09-27 DIAGNOSIS — M5137 Other intervertebral disc degeneration, lumbosacral region: Secondary | ICD-10-CM | POA: Diagnosis not present

## 2014-09-27 DIAGNOSIS — M791 Myalgia: Secondary | ICD-10-CM | POA: Diagnosis not present

## 2014-09-27 DIAGNOSIS — M5136 Other intervertebral disc degeneration, lumbar region: Secondary | ICD-10-CM | POA: Diagnosis not present

## 2014-09-27 DIAGNOSIS — M9904 Segmental and somatic dysfunction of sacral region: Secondary | ICD-10-CM | POA: Diagnosis not present

## 2014-09-27 DIAGNOSIS — M545 Low back pain: Secondary | ICD-10-CM | POA: Diagnosis not present

## 2014-10-02 DIAGNOSIS — M545 Low back pain: Secondary | ICD-10-CM | POA: Diagnosis not present

## 2014-10-02 DIAGNOSIS — M5441 Lumbago with sciatica, right side: Secondary | ICD-10-CM | POA: Diagnosis not present

## 2014-10-02 DIAGNOSIS — M5137 Other intervertebral disc degeneration, lumbosacral region: Secondary | ICD-10-CM | POA: Diagnosis not present

## 2014-10-02 DIAGNOSIS — M5136 Other intervertebral disc degeneration, lumbar region: Secondary | ICD-10-CM | POA: Diagnosis not present

## 2014-10-02 DIAGNOSIS — M791 Myalgia: Secondary | ICD-10-CM | POA: Diagnosis not present

## 2014-10-02 DIAGNOSIS — M5442 Lumbago with sciatica, left side: Secondary | ICD-10-CM | POA: Diagnosis not present

## 2014-10-02 DIAGNOSIS — M9904 Segmental and somatic dysfunction of sacral region: Secondary | ICD-10-CM | POA: Diagnosis not present

## 2014-10-02 DIAGNOSIS — M9903 Segmental and somatic dysfunction of lumbar region: Secondary | ICD-10-CM | POA: Diagnosis not present

## 2014-10-04 DIAGNOSIS — M5136 Other intervertebral disc degeneration, lumbar region: Secondary | ICD-10-CM | POA: Diagnosis not present

## 2014-10-04 DIAGNOSIS — M5441 Lumbago with sciatica, right side: Secondary | ICD-10-CM | POA: Diagnosis not present

## 2014-10-04 DIAGNOSIS — M5442 Lumbago with sciatica, left side: Secondary | ICD-10-CM | POA: Diagnosis not present

## 2014-10-04 DIAGNOSIS — M9904 Segmental and somatic dysfunction of sacral region: Secondary | ICD-10-CM | POA: Diagnosis not present

## 2014-10-04 DIAGNOSIS — M9903 Segmental and somatic dysfunction of lumbar region: Secondary | ICD-10-CM | POA: Diagnosis not present

## 2014-10-04 DIAGNOSIS — M791 Myalgia: Secondary | ICD-10-CM | POA: Diagnosis not present

## 2014-10-04 DIAGNOSIS — M5137 Other intervertebral disc degeneration, lumbosacral region: Secondary | ICD-10-CM | POA: Diagnosis not present

## 2014-10-04 DIAGNOSIS — M545 Low back pain: Secondary | ICD-10-CM | POA: Diagnosis not present

## 2014-10-18 ENCOUNTER — Telehealth: Payer: Self-pay | Admitting: Internal Medicine

## 2014-10-18 NOTE — Telephone Encounter (Signed)
The patient has been scheduled and is aware of her appointment on 2.3.15 @ 9:00 [facility].

## 2014-10-23 DIAGNOSIS — M5136 Other intervertebral disc degeneration, lumbar region: Secondary | ICD-10-CM | POA: Diagnosis not present

## 2014-10-23 DIAGNOSIS — M5416 Radiculopathy, lumbar region: Secondary | ICD-10-CM | POA: Diagnosis not present

## 2014-10-27 ENCOUNTER — Ambulatory Visit: Payer: Self-pay | Admitting: Physical Medicine and Rehabilitation

## 2014-10-27 DIAGNOSIS — M4806 Spinal stenosis, lumbar region: Secondary | ICD-10-CM | POA: Diagnosis not present

## 2014-10-27 DIAGNOSIS — M5386 Other specified dorsopathies, lumbar region: Secondary | ICD-10-CM | POA: Diagnosis not present

## 2014-10-27 DIAGNOSIS — M479 Spondylosis, unspecified: Secondary | ICD-10-CM | POA: Diagnosis not present

## 2014-11-13 DIAGNOSIS — M5136 Other intervertebral disc degeneration, lumbar region: Secondary | ICD-10-CM | POA: Diagnosis not present

## 2014-11-13 DIAGNOSIS — M5416 Radiculopathy, lumbar region: Secondary | ICD-10-CM | POA: Diagnosis not present

## 2014-11-22 ENCOUNTER — Ambulatory Visit (INDEPENDENT_AMBULATORY_CARE_PROVIDER_SITE_OTHER): Payer: Medicare Other | Admitting: Internal Medicine

## 2014-11-22 ENCOUNTER — Encounter: Payer: Self-pay | Admitting: Internal Medicine

## 2014-11-22 ENCOUNTER — Ambulatory Visit: Payer: Self-pay | Admitting: Internal Medicine

## 2014-11-22 VITALS — BP 124/78 | HR 70 | Temp 97.5°F | Resp 16 | Ht 63.0 in | Wt 141.5 lb

## 2014-11-22 DIAGNOSIS — G250 Essential tremor: Secondary | ICD-10-CM

## 2014-11-22 DIAGNOSIS — Z8711 Personal history of peptic ulcer disease: Secondary | ICD-10-CM

## 2014-11-22 DIAGNOSIS — M8580 Other specified disorders of bone density and structure, unspecified site: Secondary | ICD-10-CM | POA: Diagnosis not present

## 2014-11-22 DIAGNOSIS — R7989 Other specified abnormal findings of blood chemistry: Secondary | ICD-10-CM

## 2014-11-22 DIAGNOSIS — R5383 Other fatigue: Secondary | ICD-10-CM

## 2014-11-22 DIAGNOSIS — R946 Abnormal results of thyroid function studies: Secondary | ICD-10-CM

## 2014-11-22 DIAGNOSIS — Z23 Encounter for immunization: Secondary | ICD-10-CM

## 2014-11-22 DIAGNOSIS — Z13820 Encounter for screening for osteoporosis: Secondary | ICD-10-CM

## 2014-11-22 DIAGNOSIS — M5090 Cervical disc disorder, unspecified, unspecified cervical region: Secondary | ICD-10-CM

## 2014-11-22 DIAGNOSIS — Z1159 Encounter for screening for other viral diseases: Secondary | ICD-10-CM

## 2014-11-22 DIAGNOSIS — N951 Menopausal and female climacteric states: Secondary | ICD-10-CM | POA: Insufficient documentation

## 2014-11-22 DIAGNOSIS — K589 Irritable bowel syndrome without diarrhea: Secondary | ICD-10-CM | POA: Diagnosis not present

## 2014-11-22 DIAGNOSIS — Z0000 Encounter for general adult medical examination without abnormal findings: Secondary | ICD-10-CM

## 2014-11-22 DIAGNOSIS — Z1231 Encounter for screening mammogram for malignant neoplasm of breast: Secondary | ICD-10-CM | POA: Diagnosis not present

## 2014-11-22 DIAGNOSIS — E785 Hyperlipidemia, unspecified: Secondary | ICD-10-CM

## 2014-11-22 DIAGNOSIS — Z8719 Personal history of other diseases of the digestive system: Secondary | ICD-10-CM

## 2014-11-22 DIAGNOSIS — Z79890 Hormone replacement therapy (postmenopausal): Secondary | ICD-10-CM

## 2014-11-22 LAB — CBC WITH DIFFERENTIAL/PLATELET
BASOS ABS: 0.1 10*3/uL (ref 0.0–0.1)
Basophils Relative: 0.8 % (ref 0.0–3.0)
EOS PCT: 6.3 % — AB (ref 0.0–5.0)
Eosinophils Absolute: 0.4 10*3/uL (ref 0.0–0.7)
HCT: 37.3 % (ref 36.0–46.0)
Hemoglobin: 12.5 g/dL (ref 12.0–15.0)
LYMPHS PCT: 41.9 % (ref 12.0–46.0)
Lymphs Abs: 2.7 10*3/uL (ref 0.7–4.0)
MCHC: 33.6 g/dL (ref 30.0–36.0)
MCV: 99 fl (ref 78.0–100.0)
MONOS PCT: 8.2 % (ref 3.0–12.0)
Monocytes Absolute: 0.5 10*3/uL (ref 0.1–1.0)
NEUTROS PCT: 42.8 % — AB (ref 43.0–77.0)
Neutro Abs: 2.8 10*3/uL (ref 1.4–7.7)
Platelets: 361 10*3/uL (ref 150.0–400.0)
RBC: 3.77 Mil/uL — ABNORMAL LOW (ref 3.87–5.11)
RDW: 14.9 % (ref 11.5–15.5)
WBC: 6.5 10*3/uL (ref 4.0–10.5)

## 2014-11-22 LAB — HM MAMMOGRAPHY: HM Mammogram: NEGATIVE

## 2014-11-22 MED ORDER — ZOSTER VACCINE LIVE 19400 UNT/0.65ML ~~LOC~~ SOLR: 0.6500 mL | Freq: Once | SUBCUTANEOUS | Status: DC

## 2014-11-22 MED ORDER — TETANUS-DIPHTH-ACELL PERTUSSIS 5-2.5-18.5 LF-MCG/0.5 IM SUSP: 0.5000 mL | Freq: Once | INTRAMUSCULAR | Status: DC

## 2014-11-22 NOTE — Progress Notes (Signed)
Patient ID: Vanessa Brown, female   DOB: 03-09-1941, 74 y.o.   MRN: [id number]  The patient is here for annual Medicare wellness examination and management of other chronic and acute problems. She has been having low back pain secondary to DDD with sciatica on the right side,  Managed by Dr/ Chesnis  With plans for epidural stroid injections.   MRI has been done. She has cervical disk disease  Discovered last year managed by Dr. Clarice Pole her  chiropractor,  And her symptoms improved   Some left shoulder pain due to rotator cuff tear,  Prior shoulder surgery on right shoulder  by [place].  Symptoms improve with exercise,  Still playing golf.   Last DEXA at [facility].  ot sure when    mammpgram was done today    Neuropathy involving both legs, did not respond to Pamelor trial      The risk factors are reflected in the social history.  The roster of all physicians providing medical care to patient - is listed in the Snapshot section of the chart.  Activities of daily living:  The patient is 100% independent in all ADLs: dressing, toileting, feeding as well as independent mobility  Home safety : The patient has smoke detectors in the home. They wear seatbelts.  There are no firearms at home. There is no violence in the home.   There is no risks for hepatitis, STDs or HIV. There is no   history of blood transfusion. They have no travel history to infectious disease endemic areas of the world.  The patient has seen their dentist in the last six month. They have seen their eye doctor in the last year. They admit to slight hearing difficulty with regard to whispered voices and some television programs.  They have deferred audiologic testing in the last year.  They do not  have excessive sun exposure. Discussed the need for sun protection: hats, long sleeves and use of sunscreen if there is significant sun exposure.   Diet: the importance of a healthy diet is discussed. They do have a healthy diet.  The  benefits of regular aerobic exercise were discussed. She walks 4 times per week ,  20 minutes.   Depression screen: there are no signs or vegative symptoms of depression- irritability, change in appetite, anhedonia, sadness/tearfullness.  Cognitive assessment: the patient manages all their financial and personal affairs and is actively engaged. They could relate day,date,year and events; recalled 2/3 objects at 3 minutes; performed clock-face test normally.  The following portions of the patient's history were reviewed and updated as appropriate: allergies, current medications, past family history, past medical history,  past surgical history, past social history  and problem list.  Visual acuity was not assessed per patient preference since she has regular follow up with her ophthalmologist. Hearing and body mass index were assessed and reviewed.   Review of Systems:  Patient denies headache, fevers, malaise, unintentional weight loss, skin rash, eye pain, sinus congestion and sinus pain, sore throat, dysphagia,  hemoptysis , cough, dyspnea, wheezing, chest pain, palpitations, orthopnea, edema, abdominal pain, nausea, melena, diarrhea, constipation, flank pain, dysuria, hematuria, urinary  Frequency, nocturia, numbness, tingling, seizures,  Focal weakness, Loss of consciousness,  Tremor, insomnia, depression, anxiety, and suicidal ideation.   Objective:  BP 124/78 mmHg  Pulse 70  Temp(Src) 97.5 F (36.4 C) (Oral)  Resp 16  Ht  (1.6 m)  Wt 141 lb 8 oz (64.184 kg)  BMI 25.07 kg/m2  SpO2  98%   General appearance: alert, cooperative and appears stated age Head: Normocephalic, without obvious abnormality, atraumatic Eyes: conjunctivae/corneas clear. PERRL, EOM's intact. Fundi benign. Ears: normal TM's and external ear canals both ears Nose: Nares normal. Septum midline. Mucosa normal. No drainage or sinus tenderness. Throat: lips, mucosa, and tongue normal; teeth and gums  normal Neck: no adenopathy, no carotid bruit, no JVD, supple, symmetrical, trachea midline and thyroid not enlarged, symmetric, no tenderness/mass/nodules Lungs: clear to auscultation bilaterally Breasts: normal appearance, no masses or tenderness Heart: regular rate and rhythm, S1, S2 normal, no murmur, click, rub or gallop Abdomen: soft, non-tender; bowel sounds normal; no masses,  no organomegaly Extremities: extremities normal, atraumatic, no cyanosis or edema Pulses: 2+ and symmetric Skin: Skin color, texture, turgor normal. No rashes or lesions Neurologic: Alert and oriented X 3, normal strength and tone. Normal symmetric reflexes. Normal coordination and gait.       During the course of the visit the patient was educated and counseled about appropriate screening and preventive services including : fall prevention , diabetes screening, nutrition counseling, colorectal cancer screening, and recommended immunizations.     Assessment and Plan:  Problem List Items Addressed This Visit    Benign essential tremor    Managed with primidone by [facility].       Cervical back pain with evidence of disc disease    Symptoms have improved with chiropractic modalities.       History of gastric ulcer    She is currently asymptomatic,  Avoiding NSAIDs andnot  using a daily PPI      Hyperlipidemia - Primary    Mild, with treatment historically  deferred due to low risk of CAD and age.   Lab Results  Component Value Date   CHOL 252* 11/22/2014   HDL 84.30 11/22/2014   LDLCALC 143* 11/22/2014   LDLDIRECT 149.9 11/21/2013   TRIG 125.0 11/22/2014   CHOLHDL 3 11/22/2014         Relevant Orders   Lipid panel   Irritable bowel syndrome (IBS)   Medicare annual wellness visit, subsequent    Annual Medicare wellness  exam was done as well as a comprehensive physical exam and management of acute and chronic conditions .  During the course of the visit the patient was educated and  counseled about appropriate screening and preventive services including : fall prevention , diabetes screening, nutrition counseling, colorectal cancer screening, and recommended immunizations.  Printed recommendations for health maintenance screenings was given.       Menopause syndrome   Osteopenia   Relevant Orders   Vit D  25 hydroxy (rtn osteoporosis monitoring) (Completed)    Other Visit Diagnoses    Postmenopausal HRT (hormone replacement therapy)        Other fatigue        Relevant Orders    CBC with Differential/Platelet (Completed)    TSH    Need for hepatitis C screening test        Relevant Orders    Hepatitis C antibody (Completed)    Need for prophylactic vaccination against Streptococcus pneumoniae (pneumococcus)        Relevant Orders    Pneumococcal conjugate vaccine 13-valent (Completed)    Abnormal thyroid blood test        Relevant Orders    T4 AND TSH    Screening for osteoporosis        Relevant Orders    DG Bone Density

## 2014-11-22 NOTE — Patient Instructions (Addendum)
.You had your annual Medicare wellness exam today  I t is recommended that you  have a TDaP vaccine and a Shingles vaccine.  I have given you prescriptions for thses because they will be cheaper at the health Dept or at your  local pharmacy because Medicare will not reimburse for them.   You received the pneumonia vaccine today.  We will contact you with the bloodwork results  Health Maintenance Adopting a healthy lifestyle and getting preventive care can go a long way to promote health and wellness. Talk with your health care provider about what schedule of regular examinations is right for you. This is a good chance for you to check in with your provider about disease prevention and staying healthy. In between checkups, there are plenty of things you can do on your own. Experts have done a lot of research about which lifestyle changes and preventive measures are most likely to keep you healthy. Ask your health care provider for more information. WEIGHT AND DIET  Eat a healthy diet  Be sure to include plenty of vegetables, fruits, low-fat dairy products, and lean protein.  Do not eat a lot of foods high in solid fats, added sugars, or salt.  Get regular exercise. This is one of the most important things you can do for your health.  Most adults should exercise for at least 150 minutes each week. The exercise should increase your heart rate and make you sweat (moderate-intensity exercise).  Most adults should also do strengthening exercises at least twice a week. This is in addition to the moderate-intensity exercise.  Maintain a healthy weight  Body mass index (BMI) is a measurement that can be used to identify possible weight problems. It estimates body fat based on height and weight. Your health care provider can help determine your BMI and help you achieve or maintain a healthy weight.  For females 41 years of age and older:   A BMI below 18.5 is considered underweight.  A BMI  of 18.5 to 24.9 is normal.  A BMI of 25 to 29.9 is considered overweight.  A BMI of 30 and above is considered obese.  Watch levels of cholesterol and blood lipids  You should start having your blood tested for lipids and cholesterol at 75 years of age, then have this test every 5 years.  You may need to have your cholesterol levels checked more often if:  Your lipid or cholesterol levels are high.  You are older than 75 years of age.  You are at high risk for heart disease.  CANCER SCREENING   Lung Cancer  Lung cancer screening is recommended for adults 63-1 years old who are at high risk for lung cancer because of a history of smoking.  A yearly low-dose CT scan of the lungs is recommended for people who:  Currently smoke.  Have quit within the past 15 years.  Have at least a 30-pack-year history of smoking. A pack year is smoking an average of one pack of cigarettes a day for 1 year.  Yearly screening should continue until it has been 15 years since you quit.  Yearly screening should stop if you develop a health problem that would prevent you from having lung cancer treatment.  Breast Cancer  Practice breast self-awareness. This means understanding how your breasts normally appear and feel.  It also means doing regular breast self-exams. Let your health care provider know about any changes, no matter how small.  If you are  in your 40s or 30s, you should have a clinical breast exam (CBE) by a health care provider every 1-3 years as part of a regular health exam.  If you are 59 or older, have a CBE every year. Also consider having a breast X-ray (mammogram) every year.  If you have a family history of breast cancer, talk to your health care provider about genetic screening.  If you are at high risk for breast cancer, talk to your health care provider about having an MRI and a mammogram every year.  Breast cancer gene (BRCA) assessment is recommended for women who  have family members with BRCA-related cancers. BRCA-related cancers include:  Breast.  Ovarian.  Tubal.  Peritoneal cancers.  Results of the assessment will determine the need for genetic counseling and BRCA1 and BRCA2 testing. Cervical Cancer Routine pelvic examinations to screen for cervical cancer are no longer recommended for nonpregnant women who are considered low risk for cancer of the pelvic organs (ovaries, uterus, and vagina) and who do not have symptoms. A pelvic examination may be necessary if you have symptoms including those associated with pelvic infections. Ask your health care provider if a screening pelvic exam is right for you.   The Pap test is the screening test for cervical cancer for women who are considered at risk.  If you had a hysterectomy for a problem that was not cancer or a condition that could lead to cancer, then you no longer need Pap tests.  If you are older than 65 years, and you have had normal Pap tests for the past 10 years, you no longer need to have Pap tests.  If you have had past treatment for cervical cancer or a condition that could lead to cancer, you need Pap tests and screening for cancer for at least 20 years after your treatment.  If you no longer get a Pap test, assess your risk factors if they change (such as having a new sexual partner). This can affect whether you should start being screened again.  Some women have medical problems that increase their chance of getting cervical cancer. If this is the case for you, your health care provider may recommend more frequent screening and Pap tests.  The human papillomavirus (HPV) test is another test that may be used for cervical cancer screening. The HPV test looks for the virus that can cause cell changes in the cervix. The cells collected during the Pap test can be tested for HPV.  The HPV test can be used to screen women 12 years of age and older. Getting tested for HPV can extend the  interval between normal Pap tests from three to five years.  An HPV test also should be used to screen women of any age who have unclear Pap test results.  After 76 years of age, women should have HPV testing as often as Pap tests.  Colorectal Cancer  This type of cancer can be detected and often prevented.  Routine colorectal cancer screening usually begins at 75 years of age and continues through 75 years of age.  Your health care provider may recommend screening at an earlier age if you have risk factors for colon cancer.  Your health care provider may also recommend using home test kits to check for hidden blood in the stool.  A small camera at the end of a tube can be used to examine your colon directly (sigmoidoscopy or colonoscopy). This is done to check for the earliest forms  of colorectal cancer.  Routine screening usually begins at age 37.  Direct examination of the colon should be repeated every 5-10 years through 75 years of age. However, you may need to be screened more often if early forms of precancerous polyps or small growths are found. Skin Cancer  Check your skin from head to toe regularly.  Tell your health care provider about any new moles or changes in moles, especially if there is a change in a mole's shape or color.  Also tell your health care provider if you have a mole that is larger than the size of a pencil eraser.  Always use sunscreen. Apply sunscreen liberally and repeatedly throughout the day.  Protect yourself by wearing long sleeves, pants, a wide-brimmed hat, and sunglasses whenever you are outside. HEART DISEASE, DIABETES, AND HIGH BLOOD PRESSURE   Have your blood pressure checked at least every 1-2 years. High blood pressure causes heart disease and increases the risk of stroke.  If you are between 80 years and 2 years old, ask your health care provider if you should take aspirin to prevent strokes.  Have regular diabetes screenings. This  involves taking a blood sample to check your fasting blood sugar level.  If you are at a normal weight and have a low risk for diabetes, have this test once every three years after 75 years of age.  If you are overweight and have a high risk for diabetes, consider being tested at a younger age or more often. PREVENTING INFECTION  Hepatitis B  If you have a higher risk for hepatitis B, you should be screened for this virus. You are considered at high risk for hepatitis B if:  You were born in a country where hepatitis B is common. Ask your health care provider which countries are considered high risk.  Your parents were born in a high-risk country, and you have not been immunized against hepatitis B (hepatitis B vaccine).  You have HIV or AIDS.  You use needles to inject street drugs.  You live with someone who has hepatitis B.  You have had sex with someone who has hepatitis B.  You get hemodialysis treatment.  You take certain medicines for conditions, including cancer, organ transplantation, and autoimmune conditions. Hepatitis C  Blood testing is recommended for:  Everyone born from [birth year] through 1966.  Anyone with known risk factors for hepatitis C. Sexually transmitted infections (STIs)  You should be screened for sexually transmitted infections (STIs) including gonorrhea and chlamydia if:  You are sexually active and are younger than 75 years of age.  You are older than 75 years of age and your health care provider tells you that you are at risk for this type of infection.  Your sexual activity has changed since you were last screened and you are at an increased risk for chlamydia or gonorrhea. Ask your health care provider if you are at risk.  If you do not have HIV, but are at risk, it may be recommended that you take a prescription medicine daily to prevent HIV infection. This is called pre-exposure prophylaxis (PrEP). You are considered at risk if:  You are  sexually active and do not regularly use condoms or know the HIV status of your partner(s).  You take drugs by injection.  You are sexually active with a partner who has HIV. Talk with your health care provider about whether you are at high risk of being infected with HIV. If you choose to begin  PrEP, you should first be tested for HIV. You should then be tested every 3 months for as long as you are taking PrEP.  PREGNANCY   If you are premenopausal and you may become pregnant, ask your health care provider about preconception counseling.  If you may become pregnant, take 400 to 800 micrograms (mcg) of folic acid every day.  If you want to prevent pregnancy, talk to your health care provider about birth control (contraception). OSTEOPOROSIS AND MENOPAUSE   Osteoporosis is a disease in which the bones lose minerals and strength with aging. This can result in serious bone fractures. Your risk for osteoporosis can be identified using a bone density scan.  If you are 16 years of age or older, or if you are at risk for osteoporosis and fractures, ask your health care provider if you should be screened.  Ask your health care provider whether you should take a calcium or vitamin D supplement to lower your risk for osteoporosis.  Menopause may have certain physical symptoms and risks.  Hormone replacement therapy may reduce some of these symptoms and risks. Talk to your health care provider about whether hormone replacement therapy is right for you.  HOME CARE INSTRUCTIONS   Schedule regular health, dental, and eye exams.  Stay current with your immunizations.   Do not use any tobacco products including cigarettes, chewing tobacco, or electronic cigarettes.  If you are pregnant, do not drink alcohol.  If you are breastfeeding, limit how much and how often you drink alcohol.  Limit alcohol intake to no more than 1 drink per day for nonpregnant women. One drink equals 12 ounces of beer, 5  ounces of wine, or 1 ounces of hard liquor.  Do not use street drugs.  Do not share needles.  Ask your health care provider for help if you need support or information about quitting drugs.  Tell your health care provider if you often feel depressed.  Tell your health care provider if you have ever been abused or do not feel safe at home. Document Released: 04/21/2011 Document Revised: 02/20/2014 Document Reviewed: 09/07/2013 [facility] Patient Information 2015 [place], [place]. This information is not intended to replace advice given to you by your health care provider. Make sure you discuss any questions you have with your health care provider.

## 2014-11-23 LAB — LIPID PANEL
Cholesterol: 252 mg/dL — ABNORMAL HIGH (ref 0–200)
HDL: 84.3 mg/dL (ref >39.00)
LDL CALC: 143 mg/dL — AB (ref 0–99)
NONHDL: 167.7
TRIGLYCERIDES: 125 mg/dL (ref 0.0–149.0)
Total CHOL/HDL Ratio: 3
VLDL: 25 mg/dL (ref 0.0–40.0)

## 2014-11-23 LAB — HEPATITIS C ANTIBODY: HCV Ab: NEGATIVE

## 2014-11-23 LAB — TSH: TSH: 4.57 u[IU]/mL — ABNORMAL HIGH (ref 0.35–4.50)

## 2014-11-23 LAB — VITAMIN D 25 HYDROXY (VIT D DEFICIENCY, FRACTURES): VITD: 56.41 ng/mL (ref 30.00–100.00)

## 2014-11-24 ENCOUNTER — Encounter: Payer: Self-pay | Admitting: Internal Medicine

## 2014-11-24 DIAGNOSIS — M5136 Other intervertebral disc degeneration, lumbar region: Secondary | ICD-10-CM | POA: Diagnosis not present

## 2014-11-24 DIAGNOSIS — M5416 Radiculopathy, lumbar region: Secondary | ICD-10-CM | POA: Diagnosis not present

## 2014-11-25 ENCOUNTER — Encounter: Payer: Self-pay | Admitting: Internal Medicine

## 2014-11-25 NOTE — Assessment & Plan Note (Signed)
She is currently asymptomatic,  Avoiding NSAIDs andnot  using a daily PPI

## 2014-11-25 NOTE — Assessment & Plan Note (Signed)

## 2014-11-25 NOTE — Assessment & Plan Note (Signed)
[facility]

## 2014-11-25 NOTE — Assessment & Plan Note (Signed)
Secondary to MRI confirmed disk herniation at multiple levels.  Managed by [facility].

## 2014-11-25 NOTE — Assessment & Plan Note (Signed)
Mild, with treatment historically  deferred due to low risk of CAD and age.   Lab Results  Component Value Date   CHOL 252* 11/22/2014   HDL 84.30 11/22/2014   LDLCALC 143* 11/22/2014   LDLDIRECT 149.9 11/21/2013   TRIG 125.0 11/22/2014   CHOLHDL 3 11/22/2014

## 2014-11-25 NOTE — Assessment & Plan Note (Signed)
Symptoms have improved with chiropractic modalities.

## 2014-11-26 ENCOUNTER — Telehealth: Payer: Self-pay | Admitting: Internal Medicine

## 2014-11-29 ENCOUNTER — Other Ambulatory Visit: Payer: Self-pay | Admitting: *Deleted

## 2014-11-29 DIAGNOSIS — Z79890 Hormone replacement therapy (postmenopausal): Secondary | ICD-10-CM

## 2014-11-29 MED ORDER — ESTRADIOL 0.025 MG/24HR TD PTWK: 0.0250 mg | MEDICATED_PATCH | TRANSDERMAL | Status: DC

## 2014-11-30 ENCOUNTER — Telehealth: Payer: Self-pay

## 2014-11-30 NOTE — Telephone Encounter (Signed)
Called and notified patient of labs and mailed patient copy.

## 2014-11-30 NOTE — Telephone Encounter (Signed)
The patient called hoping to get information regarding her lab work.   

## 2014-12-06 ENCOUNTER — Encounter: Payer: Self-pay | Admitting: *Deleted

## 2014-12-14 NOTE — Telephone Encounter (Signed)
See message.

## 2014-12-20 DIAGNOSIS — M5136 Other intervertebral disc degeneration, lumbar region: Secondary | ICD-10-CM | POA: Diagnosis not present

## 2014-12-20 DIAGNOSIS — M5416 Radiculopathy, lumbar region: Secondary | ICD-10-CM | POA: Diagnosis not present

## 2014-12-26 ENCOUNTER — Other Ambulatory Visit (INDEPENDENT_AMBULATORY_CARE_PROVIDER_SITE_OTHER): Payer: Medicare Other

## 2014-12-26 DIAGNOSIS — R946 Abnormal results of thyroid function studies: Secondary | ICD-10-CM

## 2014-12-26 DIAGNOSIS — R7989 Other specified abnormal findings of blood chemistry: Secondary | ICD-10-CM

## 2014-12-28 ENCOUNTER — Encounter: Payer: Self-pay | Admitting: Internal Medicine

## 2014-12-28 LAB — T4 AND TSH
T4 TOTAL: 7.2 ug/dL (ref 4.5–12.0)
TSH: 4.99 u[IU]/mL — ABNORMAL HIGH (ref 0.450–4.500)

## 2014-12-29 ENCOUNTER — Telehealth: Payer: Self-pay | Admitting: Internal Medicine

## 2014-12-29 MED ORDER — LEVOTHYROXINE SODIUM 50 MCG PO TABS: 50.0000 ug | ORAL_TABLET | Freq: Every day | ORAL | Status: DC

## 2014-12-29 NOTE — Telephone Encounter (Signed)
Patient notified and voiced understanding.

## 2014-12-29 NOTE — Telephone Encounter (Signed)
Would try lo dose synthroid and follow up with me in 6 weeks  rx sent

## 2014-12-29 NOTE — Telephone Encounter (Signed)
Pt called to get her lab results from 3/9  She stated she is unable to get email

## 2014-12-29 NOTE — Telephone Encounter (Signed)
Pt informed of mychart message: Your thyroid function remains borderline underactive. I would not treat unless you are having symptoms of underactive thyroid: Fatigue,. Wt gain, Constipation,  Hair loss, Dry skin. Please let me know if you are having any of these symptoms and we can discuss therapy at your next visit.   Pt requests to have mychart deactivated, this has been done.  Pt notified of results,  verbalized understanding. 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 Doesn't know what to do, whether to treat or not. Would like Dr. Melina Schoolsullo's recommendation based on her symptoms.

## 2014-12-29 NOTE — Telephone Encounter (Signed)
Pt notified see other telephone note

## 2015-01-10 NOTE — Telephone Encounter (Signed)
Pt was notified of lab results 11/30/14, see telephone note.

## 2015-01-24 DIAGNOSIS — M5416 Radiculopathy, lumbar region: Secondary | ICD-10-CM | POA: Diagnosis not present

## 2015-01-24 DIAGNOSIS — M5136 Other intervertebral disc degeneration, lumbar region: Secondary | ICD-10-CM | POA: Diagnosis not present

## 2015-01-26 NOTE — Telephone Encounter (Signed)
Mailed patient her unread my chart message on 01/26/2015.

## 2015-01-29 ENCOUNTER — Telehealth: Payer: Self-pay | Admitting: *Deleted

## 2015-01-29 DIAGNOSIS — E034 Atrophy of thyroid (acquired): Secondary | ICD-10-CM

## 2015-01-29 DIAGNOSIS — E785 Hyperlipidemia, unspecified: Secondary | ICD-10-CM

## 2015-01-29 DIAGNOSIS — Z79899 Other long term (current) drug therapy: Secondary | ICD-10-CM

## 2015-01-29 NOTE — Telephone Encounter (Signed)
Pt coming in tomorrow what labs and dx?  

## 2015-01-30 ENCOUNTER — Other Ambulatory Visit (INDEPENDENT_AMBULATORY_CARE_PROVIDER_SITE_OTHER): Payer: Medicare Other

## 2015-01-30 ENCOUNTER — Telehealth: Payer: Self-pay | Admitting: *Deleted

## 2015-01-30 DIAGNOSIS — E034 Atrophy of thyroid (acquired): Secondary | ICD-10-CM | POA: Diagnosis not present

## 2015-01-30 DIAGNOSIS — R5383 Other fatigue: Secondary | ICD-10-CM | POA: Insufficient documentation

## 2015-01-30 DIAGNOSIS — E038 Other specified hypothyroidism: Secondary | ICD-10-CM | POA: Diagnosis not present

## 2015-01-30 DIAGNOSIS — Z79899 Other long term (current) drug therapy: Secondary | ICD-10-CM

## 2015-01-30 DIAGNOSIS — E785 Hyperlipidemia, unspecified: Secondary | ICD-10-CM | POA: Diagnosis not present

## 2015-01-30 LAB — LIPID PANEL
CHOL/HDL RATIO: 3
CHOLESTEROL: 221 mg/dL — AB (ref 0–200)
HDL: 72.9 mg/dL (ref >39.00)
LDL CALC: 137 mg/dL — AB (ref 0–99)
NonHDL: 148.1
Triglycerides: 54 mg/dL (ref 0.0–149.0)
VLDL: 10.8 mg/dL (ref 0.0–40.0)

## 2015-01-30 LAB — COMPREHENSIVE METABOLIC PANEL
ALT: 14 U/L (ref 0–35)
AST: 15 U/L (ref 0–37)
Albumin: 3.9 g/dL (ref 3.5–5.2)
Alkaline Phosphatase: 91 U/L (ref 39–117)
BUN: 10 mg/dL (ref 6–23)
CHLORIDE: 104 meq/L (ref 96–112)
CO2: 29 mEq/L (ref 19–32)
Calcium: 9.5 mg/dL (ref 8.4–10.5)
Creatinine, Ser: 0.74 mg/dL (ref 0.40–1.20)
GFR: 81.43 mL/min (ref >60.00)
Glucose, Bld: 117 mg/dL — ABNORMAL HIGH (ref 70–99)
POTASSIUM: 5 meq/L (ref 3.5–5.1)
Sodium: 138 mEq/L (ref 135–145)
TOTAL PROTEIN: 6.7 g/dL (ref 6.0–8.3)
Total Bilirubin: 0.3 mg/dL (ref 0.2–1.2)

## 2015-01-30 LAB — TSH: TSH: 2.02 u[IU]/mL (ref 0.35–4.50)

## 2015-01-30 NOTE — Telephone Encounter (Signed)
Patient had labs today just FYI patient wants to be notified by phone. Thanks

## 2015-01-30 NOTE — Telephone Encounter (Signed)
Pt would like a call for her results [facility] of [facility]

## 2015-01-30 NOTE — Telephone Encounter (Signed)
Labs ordered,  Sorry for the delay

## 2015-02-09 ENCOUNTER — Encounter: Payer: Self-pay | Admitting: Internal Medicine

## 2015-02-09 ENCOUNTER — Ambulatory Visit (INDEPENDENT_AMBULATORY_CARE_PROVIDER_SITE_OTHER): Payer: Medicare Other | Admitting: Internal Medicine

## 2015-02-09 VITALS — BP 108/72 | HR 63 | Temp 98.3°F | Resp 16 | Ht 63.0 in | Wt 143.8 lb

## 2015-02-09 DIAGNOSIS — E785 Hyperlipidemia, unspecified: Secondary | ICD-10-CM

## 2015-02-09 NOTE — Patient Instructions (Addendum)
Almond Milk Light  In original formula by Silk brand is a great calcium source with no cholesterol  Red Yeast rice is available in capsule form 600 mg twice daily for cholesterol management.   Your last fasting glucose indicates you are at risk for developing diabetes, so I will checking an A1c with your next lab visit  In 3 months     This is  my version of a  "Low GI"  Diet:  It will still lower your blood sugars and allow you to lose 4 to 8  lbs  per month if you follow it carefully.  Your goal with exercise is a minimum of 30 minutes of aerobic exercise 5 days per week (Walking does not count once it becomes easy!)     All of the foods can be found at grocery stores and in bulk at [employer].  The Atkins protein bars and shakes are available in more varieties at Target, WalMart and [employer]'s Foods.     7 AM Breakfast:  Choose from the following:  Low carbohydrate Protein  Shakes (I recommend the EAS AdvantEdge "Carb Control" shakes, Premier Protein, Muscle Milk ,   Or the low carb shakes by Atkins.    2.5 carbs   Arnold's "Sandwhich Thin"toasted  w/ peanut butter (no jelly: about 20 net carbs  "Bagel Thin" with cream cheese and salmon: about 20 carbs   a scrambled egg/bacon/cheese burrito made with Mission's "carb balance" whole wheat tortilla  (about 10 net carbs )  A slice of home made fritatta (egg based dish without a crust:  google it)    Avoid cereal and bananas, oatmeal and cream of wheat and grits. They are loaded with carbohydrates!   10 AM: high protein snack  Protein bar by Atkins (the snack size, under 200 cal, usually < 6 net carbs).    A stick of cheese:  Around 1 carb,  100 cal     Dannon Light n Fit [place] Yogurt  (80 cal, 8 carbs)  Other so called "protein bars" and Greek yogurts tend to be loaded with carbohydrates.  Remember, in food advertising, the word "energy" is synonymous for " carbohydrate."  Lunch:   A Sandwich using the bread choices listed, Can use any  Eggs,   lunchmeat, grilled meat or canned tuna), avocado, regular mayo/mustard  and cheese.  A Salad using blue cheese, ranch,  Goddess or vinagrette,  No croutons or "confetti" and no "candied nuts" but regular nuts OK.   No pretzels or chips.  Pickles and miniature sweet peppers are a good low carb alternative that provide a "crunch"  The bread is the only source of carbohydrate in a sandwich and  can be decreased by trying some of these alternatives to traditional loaf bread  Joseph's makes a pita bread and a flat bread that are 50 cal and 4 net carbs available at BJs and WalMart.  This can be toasted to use with hummous as well  Toufayan makes a low carb flatbread that's 100 cal and 9 net carbs available at [employer] and [employer]  Mission makes 2 sizes of  Low carb whole wheat tortilla  (The large one is 210 cal and 6 net carbs)  Flat Out makes flatbreads that are low carb as well  Avoid "Low fat dressings, as well as Reyne Dumas[place]ina and [address] dressings They are loaded with sugar!   3 PM/ Mid day  Snack:  Consider  1 ounce of  almonds, walnuts, pistachios, pecans, peanuts,  Macadamia nuts or a nut medley.  Avoid "granola"; the dried cranberries and raisins are loaded with carbohydrates. Mixed nuts as long as there are no raisins,  cranberries or dried fruit.    Try the prosciutto/mozzarella cheese sticks by Fiorruci  In deli /backery section   High protein   To avoid overindulging in snacks: Try drinking a glass of unsweeted almond/coconut milk  Or a cup of coffee with your Atkins chocolate bar to keep you from having 3!!!   Pork rinds!  Yes Pork Rinds        6 PM  Dinner:     Meat/fowl/fish with a green salad, and either broccoli, cauliflower, green beans, spinach, brussel sprouts or  Lima beans. DO NOT BREAD THE PROTEIN!!      There is a low carb pasta by Dreamfield's that is acceptable and tastes great: only 5 digestible carbs/serving.( All grocery stores but BJs carry it )  Try Kai Levins  Angelo's chicken piccata or chicken or eggplant parm over low carb pasta.(Lowes and BJs)   Clifton Custard Sanchez's "Carnitas" (pulled pork, no sauce,  0 carbs) or his beef pot roast to make a dinner burrito (at BJ's)  Pesto over low carb pasta (bj's sells a good quality pesto in the center refrigerated section of the deli   Try satueeing  Roosvelt Harps with mushroooms  Whole wheat pasta is still full of digestible carbs and  Not as low in glycemic index as Dreamfield's.   Brown rice is still rice,  So skip the rice and noodles if you eat [place] or [place] (or at least limit to 1/2 cup)  9 PM snack :   Breyer's "low carb" fudgsicle or  ice cream bar (Carb Smart line), or  Weight Watcher's ice cream bar , or another "no sugar added" ice cream;  a serving of fresh berries/cherries with whipped cream   Cheese or DANNON'S LlGHT N FIT GREEK YOGURT or the [place] greek yogurt   8 ounces of [place] unsweetened almond/cococunut milk  Cheese and crackers (using WASA crackers,  They are low carb) or peanut butter on low carb crackers or pita bread     Avoid bananas, pineapple, grapes  and watermelon on a regular basis because they are high in sugar.  THINK OF THEM AS DESSERT  Remember that snack Substitutions should be less than 10 NET carbs per serving and meals should be < 25 net carbs. Remember that carbohydrates from fiber do not affect blood sugar, so you can  subtract fiber grams to get the "net carbs " of any particular food item.

## 2015-02-09 NOTE — Progress Notes (Signed)
Pre-visit discussion using our clinic review tool. No additional management support is needed unless otherwise documented below in the visit note.  

## 2015-02-11 ENCOUNTER — Encounter: Payer: Self-pay | Admitting: Internal Medicine

## 2015-02-11 NOTE — Assessment & Plan Note (Signed)
Discussed trial of red yeast rice 600 mg bid ad repeat in 6 months,  Low GI/mediterranean diet also discussed.

## 2015-02-11 NOTE — Progress Notes (Signed)
Patient ID: Vanessa Brown, female   DOB: [birth year], 74 y.o.   MRN: [id number]  Patient Active Problem List   Diagnosis Date Noted  . Hypothyroidism 01/30/2015  . Menopause syndrome 11/22/2014  . Hyperlipidemia LDL goal <130 11/24/2013  . Hyperlipidemia 11/24/2013  . Anxiety state, unspecified 11/23/2013  . Vaginitis and vulvovaginitis 11/21/2013  . Medicare annual wellness visit, subsequent 11/21/2012  . Lumbago due to displacement of intervertebral disc 10/30/2011  . Osteopenia 10/30/2011  . Cervical back pain with evidence of disc disease   . Screening for breast cancer 09/06/2011  . Benign essential tremor 09/06/2011  . Irritable bowel syndrome (IBS)   . GERD (gastroesophageal reflux disease)   . Neuropathy   . Screening for cervical cancer   . Tremor, essential   . History of gastric ulcer     Subjective:  CC:   Chief Complaint  Patient presents with  . Follow-up    lab to discuss choleaterol medication.    HPI:   Vanessa Brown is a 74 y.o. female who presents for discussion  recent labs indicating hyperlipidemia.   She feels generally well, is exercising several times per week and following a carbohydrate modified diet 6 days per week. Has no history of CAD or CVA. Marland Kitchen Appetite is good. Does not want to start medicatio if not necessary      Past Medical History  Diagnosis Date  . Anxiety   . Irritable bowel syndrome (IBS)     Dorena Cookey, [facility]  . Arthritis     spine, prior Pain Clinic patient  . Screening for breast cancer   . Screening for cervical cancer   . Tremor, essential     treated with primidone 25 mg qhs  . GERD (gastroesophageal reflux disease)   . Gastric ulcer     H Pylori positive  . Duodenal ulcer   . Neuropathy     "toilet seat", Sherryll Burger), caused  by sitting on commode  . Cervical back pain with evidence of disc disease 2007    prior epidural steorid injection s    Past Surgical History  Procedure Laterality Date  . Rotator cuff repair   2004    right shoulder , [place]  . Biopsy breast      benign,. right breast ([place])  . Abdominal hysterectomy  1967  . Breast surgery      [place], benign biopsies       The following portions of the patient's history were reviewed and updated as appropriate: Allergies, current medications, and problem list.    Review of Systems:   Patient denies headache, fevers, malaise, unintentional weight loss, skin rash, eye pain, sinus congestion and sinus pain, sore throat, dysphagia,  hemoptysis , cough, dyspnea, wheezing, chest pain, palpitations, orthopnea, edema, abdominal pain, nausea, melena, diarrhea, constipation, flank pain, dysuria, hematuria, urinary  Frequency, nocturia, numbness, tingling, seizures,  Focal weakness, Loss of consciousness,  Tremor, insomnia, depression, anxiety, and suicidal ideation.     History   Social History  . Marital Status: Married    Spouse Name: N/A  . Number of Children: N/A  . Years of Education: N/A   Occupational History  . Not on file.   Social History Main Topics  . Smoking status: Former Smoker    Types: Cigarettes    Quit date: 09/03/1999  . Smokeless tobacco: Never Used  . Alcohol Use: Yes  . Drug Use: No  . Sexual Activity: Not on file   Other Topics  Concern  . Not on file   Social History Narrative    Objective:  Filed Vitals:   02/09/15 1124  BP: 108/72  Pulse: 63  Temp: 98.3 F (36.8 C)  Resp: 16     General appearance: alert, cooperative and appears stated age Ears: normal TM's and external ear canals both ears Throat: lips, mucosa, and tongue normal; teeth and gums normal Neck: no adenopathy, no carotid bruit, supple, symmetrical, trachea midline and thyroid not enlarged, symmetric, no tenderness/mass/nodules Back: symmetric, no curvature. ROM normal. No CVA tenderness. Lungs: clear to auscultation bilaterally Heart: regular rate and rhythm, S1, S2 normal, no murmur, click, rub or gallop Abdomen: soft,  non-tender; bowel sounds normal; no masses,  no organomegaly Pulses: 2+ and symmetric Skin: Skin color, texture, turgor normal. No rashes or lesions Lymph nodes: Cervical, supraclavicular, and axillary nodes normal.  Assessment and Plan:  Hyperlipidemia LDL goal <130 Discussed trial of red yeast rice 600 mg bid ad repeat in 6 months,  Low GI/mediterranean diet also discussed.     Updated Medication List Outpatient Encounter Prescriptions as of 02/09/2015  Medication Sig  . Biotin 5000 MCG CAPS Take 1 capsule by mouth daily.  . Black Cohosh 540 MG CAPS Take 1 capsule by mouth daily.  . calcium carbonate (OS-CAL) 600 MG TABS Take 600 mg by mouth 2 (two) times daily with a meal.    . cyanocobalamin 1000 MCG tablet Take 1,000 mcg by mouth daily.  Marland Kitchen. estradiol (CLIMARA) 0.025 mg/24hr patch Place 1 patch (0.025 mg total) onto the skin once a week.  . levothyroxine (SYNTHROID, LEVOTHROID) 50 MCG tablet Take 1 tablet (50 mcg total) by mouth daily.  . primidone (MYSOLINE) 50 MG tablet Take 50 mg by mouth 2 (two) times daily.  . Tdap (BOOSTRIX) 5-2.5-18.5 LF-MCG/0.5 injection Inject 0.5 mLs into the muscle once. (Patient not taking: Reported on 02/09/2015)  . zoster vaccine live, PF, (ZOSTAVAX) [id number] UNT/0.65ML injection Inject 19,400 Units into the skin once. (Patient not taking: Reported on 02/09/2015)  . [DISCONTINUED] ALPRAZolam (XANAX) 0.5 MG tablet Take 1 tablet (0.5 mg total) by mouth 2 (two) times daily as needed for anxiety. (Patient not taking: Reported on 11/22/2014)  . [DISCONTINUED] fluconazole (DIFLUCAN) 150 MG tablet Take 1 tablet (150 mg total) by mouth daily. (Patient not taking: Reported on 11/22/2014)     No orders of the defined types were placed in this encounter.    Return in about 6 months (around 08/11/2015).

## 2015-03-19 DIAGNOSIS — S90861A Insect bite (nonvenomous), right foot, initial encounter: Secondary | ICD-10-CM | POA: Diagnosis not present

## 2015-03-19 DIAGNOSIS — W57XXXA Bitten or stung by nonvenomous insect and other nonvenomous arthropods, initial encounter: Secondary | ICD-10-CM | POA: Diagnosis not present

## 2015-04-02 DIAGNOSIS — G629 Polyneuropathy, unspecified: Secondary | ICD-10-CM | POA: Diagnosis not present

## 2015-04-02 DIAGNOSIS — G250 Essential tremor: Secondary | ICD-10-CM | POA: Diagnosis not present

## 2015-04-12 ENCOUNTER — Other Ambulatory Visit: Payer: Medicare Other

## 2015-04-13 ENCOUNTER — Telehealth: Payer: Self-pay | Admitting: *Deleted

## 2015-04-13 ENCOUNTER — Other Ambulatory Visit (INDEPENDENT_AMBULATORY_CARE_PROVIDER_SITE_OTHER): Payer: Medicare Other

## 2015-04-13 DIAGNOSIS — E038 Other specified hypothyroidism: Secondary | ICD-10-CM

## 2015-04-13 LAB — TSH: TSH: 2.54 u[IU]/mL (ref 0.35–4.50)

## 2015-04-13 NOTE — Telephone Encounter (Signed)
Patient scheduled.

## 2015-04-16 ENCOUNTER — Encounter: Payer: Self-pay | Admitting: *Deleted

## 2015-05-14 ENCOUNTER — Ambulatory Visit (INDEPENDENT_AMBULATORY_CARE_PROVIDER_SITE_OTHER): Payer: Medicare Other | Admitting: Nurse Practitioner

## 2015-05-14 VITALS — BP 116/68 | HR 68 | Temp 97.9°F | Resp 14 | Ht 63.0 in | Wt 141.8 lb

## 2015-05-14 DIAGNOSIS — R300 Dysuria: Secondary | ICD-10-CM | POA: Diagnosis not present

## 2015-05-14 LAB — POCT URINALYSIS DIPSTICK
BILIRUBIN UA: NEGATIVE
Blood, UA: NEGATIVE
Glucose, UA: NEGATIVE
Ketones, UA: NEGATIVE
Nitrite, UA: NEGATIVE
PROTEIN UA: NEGATIVE
Spec Grav, UA: <=1.005
Urobilinogen, UA: 0.2
pH, UA: 5.5

## 2015-05-14 MED ORDER — CIPROFLOXACIN HCL 250 MG PO TABS: 250.0000 mg | ORAL_TABLET | Freq: Two times a day (BID) | ORAL | Status: DC

## 2015-05-14 NOTE — Patient Instructions (Signed)
Please take a probiotic ( Align, Floraque or Culturelle) while you are on the antibiotic to prevent a serious antibiotic associated diarrhea  Called clostirudium dificile colitis and a vaginal yeast infection.  

## 2015-05-14 NOTE — Progress Notes (Signed)
Pre visit review using our clinic review tool, if applicable. No additional management support is needed unless otherwise documented below in the visit note. 

## 2015-05-14 NOTE — Progress Notes (Signed)
   Subjective:    Patient ID: Vanessa Brown, female    DOB: Feb 14, 1941, 74 y.o.   MRN: [id number]  HPI  Vanessa Brown is a 74 yo female with a CC of dysuria.   1) Irritation, burning, urgency x 5 days. Worsening, fatigued   Drinking water  Vaginal cream- not helpful   Recently stung 3 x by yellow jackets, but improving.   Review of Systems  Constitutional: Positive for fatigue. Negative for fever, chills and diaphoresis.  Eyes: Negative for visual disturbance.  Gastrointestinal: Negative for nausea, vomiting and diarrhea.  Genitourinary: Positive for dysuria, urgency and frequency.  Skin: Positive for color change.       From bee stings      Objective:   Physical Exam  Constitutional: She is oriented to person, place, and time. She appears well-developed and well-nourished. No distress.  BP 116/68 mmHg  Pulse 68  Temp(Src) 97.9 F (36.6 C)  Resp 14  Ht  (1.6 m)  Wt 141 lb 12.8 oz (64.32 kg)  BMI 25.13 kg/m2  SpO2 97%   HENT:  Head: Normocephalic and atraumatic.  Right Ear: External ear normal.  Left Ear: External ear normal.  Eyes: Right eye exhibits no discharge. Left eye exhibits no discharge. No scleral icterus.  Abdominal: Soft. She exhibits no distension and no mass. There is no tenderness. There is no rebound, no guarding and no CVA tenderness.  Neurological: She is alert and oriented to person, place, and time.  Skin: Skin is warm and dry. No rash noted. She is not diaphoretic. There is erythema.  Erythema resolving at sting sites  Psychiatric: She has a normal mood and affect. Her behavior is normal. Judgment and thought content normal.      Assessment & Plan:  Dysuria  1) POCT urine suspicious for UTI 2) Treat w/ cipro  3) Obtain Culture 4) F/U after results

## 2015-05-17 ENCOUNTER — Other Ambulatory Visit: Payer: Self-pay | Admitting: Nurse Practitioner

## 2015-05-17 LAB — URINE CULTURE

## 2015-05-17 MED ORDER — SULFAMETHOXAZOLE-TRIMETHOPRIM 800-160 MG PO TABS: 1.0000 | ORAL_TABLET | Freq: Two times a day (BID) | ORAL | Status: DC

## 2015-05-27 ENCOUNTER — Encounter: Payer: Self-pay | Admitting: Nurse Practitioner

## 2015-07-25 DIAGNOSIS — Z23 Encounter for immunization: Secondary | ICD-10-CM | POA: Diagnosis not present

## 2015-08-01 DIAGNOSIS — H2513 Age-related nuclear cataract, bilateral: Secondary | ICD-10-CM | POA: Diagnosis not present

## 2015-08-10 ENCOUNTER — Ambulatory Visit (INDEPENDENT_AMBULATORY_CARE_PROVIDER_SITE_OTHER): Payer: Medicare Other | Admitting: Internal Medicine

## 2015-08-10 ENCOUNTER — Encounter: Payer: Self-pay | Admitting: Internal Medicine

## 2015-08-10 VITALS — BP 138/76 | HR 60 | Temp 97.8°F | Resp 12 | Ht 63.0 in | Wt 136.5 lb

## 2015-08-10 DIAGNOSIS — E038 Other specified hypothyroidism: Secondary | ICD-10-CM | POA: Diagnosis not present

## 2015-08-10 DIAGNOSIS — K5909 Other constipation: Secondary | ICD-10-CM

## 2015-08-10 DIAGNOSIS — R194 Change in bowel habit: Secondary | ICD-10-CM

## 2015-08-10 DIAGNOSIS — E785 Hyperlipidemia, unspecified: Secondary | ICD-10-CM | POA: Diagnosis not present

## 2015-08-10 DIAGNOSIS — E034 Atrophy of thyroid (acquired): Secondary | ICD-10-CM

## 2015-08-10 MED ORDER — RED YEAST RICE 600 MG PO CAPS: 1.0000 | ORAL_CAPSULE | Freq: Two times a day (BID) | ORAL | Status: DC

## 2015-08-10 NOTE — Patient Instructions (Addendum)
Your  diarrhea may be due to the high caffeine content in your energy drink, or something else in that drink.  It could also be from actually being so constipated that only a liquid stool can pass.   Try suspending  Zip Fizz for a week to see if it resolves   [facility] .  [facility]   Return next week for fasting labs

## 2015-08-10 NOTE — Progress Notes (Signed)
Subjective:  Patient ID: Vanessa Brown, female    DOB: 01/14/1941  Age: 74 y.o. MRN: [id number]  CC: The primary encounter diagnosis was Other constipation. Diagnoses of Hyperlipidemia, Hypothyroidism due to acquired atrophy of thyroid, and Change in stool habits were also pertinent to this visit. HPI Vanessa Brown presents for follow up on hypothyroid, hyperlipidemia, and overweight addressed in May with RYR and low GI diet,has lost 7 lbs since last visit with me.  subacute onset of liquid stools  Every morning for the past several months. . Every stool has been liquid, maxium of two daily .  Marland Kitchen  SMALL caliber stools with it  VOLUME LIGHT COLORED.  NO CRAMPING , NO FEVERS,  NO BRBPR. WAS treated with abx twice this summer.  Feels blaoted at times does not take any laxatives on a reg baseis has not tried immodium  Fears constipatn  She has a long history of chronic  "stomach problems" including PUD and duodenal ulcers,  As well as gastric Polyps .  Last EGD and colonoscopy was reportedly by Dr Vanessa Brown in 2015,  But no EGD report is available.   5 yr follow up   No change in diet,  No OTC supplements except that she has been Using an energy drink called Zip fizz, which has caffeine and B12     Outpatient Prescriptions Prior to Visit  Medication Sig Dispense Refill  . Biotin 5000 MCG CAPS Take 1 capsule by mouth daily.    . Black Cohosh 540 MG CAPS Take 1 capsule by mouth daily.    . calcium carbonate (OS-CAL) 600 MG TABS Take 600 mg by mouth daily with breakfast.     . cyanocobalamin 1000 MCG tablet Take 1,000 mcg by mouth daily.    Marland Kitchen estradiol (CLIMARA) 0.025 mg/24hr patch Place 1 patch (0.025 mg total) onto the skin once a week. 4 patch 11  . levothyroxine (SYNTHROID, LEVOTHROID) 50 MCG tablet Take 1 tablet (50 mcg total) by mouth daily. 90 tablet 3  . primidone (MYSOLINE) 50 MG tablet Take 250 mg by mouth 2 (two) times daily.     . Tdap (BOOSTRIX) 5-2.5-18.5 LF-MCG/0.5 injection Inject 0.5 mLs  into the muscle once. (Patient not taking: Reported on 08/10/2015) 0.5 mL 0  . zoster vaccine live, PF, (ZOSTAVAX) 91478 UNT/0.65ML injection Inject 19,400 Units into the skin once. (Patient not taking: Reported on 08/10/2015) 1 each 0  . ciprofloxacin (CIPRO) 250 MG tablet Take 1 tablet (250 mg total) by mouth 2 (two) times daily. 6 tablet 0  . sulfamethoxazole-trimethoprim (BACTRIM DS,SEPTRA DS) 800-160 MG per tablet Take 1 tablet by mouth 2 (two) times daily. 10 tablet 0   No facility-administered medications prior to visit.    Review of Systems;  Patient denies headache, fevers, malaise, unintentional weight loss, skin rash, eye pain, sinus congestion and sinus pain, sore throat, dysphagia,  hemoptysis , cough, dyspnea, wheezing, chest pain, palpitations, orthopnea, edema, abdominal pain, nausea, melena, diarrhea, constipation, flank pain, dysuria, hematuria, urinary  Frequency, nocturia, numbness, tingling, seizures,  Focal weakness, Loss of consciousness,  Tremor, insomnia, depression, anxiety, and suicidal ideation.      Objective:  BP 138/76 mmHg  Pulse 60  Temp(Src) 97.8 F (36.6 C) (Oral)  Resp 12  Ht  (1.6 m)  Wt 136 lb 8 oz (61.916 kg)  BMI 24.19 kg/m2  SpO2 97%  BP Readings from Last 3 Encounters:  08/10/15 138/76  05/14/15 116/68  02/09/15 108/72  Wt Readings from Last 3 Encounters:  08/10/15 136 lb 8 oz (61.916 kg)  05/14/15 141 lb 12.8 oz (64.32 kg)  02/09/15 143 lb 12 oz (65.205 kg)    General appearance: alert, cooperative and appears stated age Ears: normal TM's and external ear canals both ears Throat: lips, mucosa, and tongue normal; teeth and gums normal Neck: no adenopathy, no carotid bruit, supple, symmetrical, trachea midline and thyroid not enlarged, symmetric, no tenderness/mass/nodules Back: symmetric, no curvature. ROM normal. No CVA tenderness. Lungs: clear to auscultation bilaterally Heart: regular rate and rhythm, S1, S2 normal, no  murmur, click, rub or gallop Abdomen: soft, non-tender; bowel sounds normal; no masses,  no organomegaly Pulses: 2+ and symmetric Skin: Skin color, texture, turgor normal. No rashes or lesions Lymph nodes: Cervical, supraclavicular, and axillary nodes normal.  No results found for: HGBA1C  Lab Results  Component Value Date   CREATININE 0.74 01/30/2015   CREATININE 0.7 11/21/2013   CREATININE 0.8 11/30/2012    Lab Results  Component Value Date   WBC 6.5 11/22/2014   HGB 12.5 11/22/2014   HCT 37.3 11/22/2014   PLT 361.0 11/22/2014   GLUCOSE 117* 01/30/2015   CHOL 221* 01/30/2015   TRIG 54.0 01/30/2015   HDL 72.90 01/30/2015   LDLDIRECT 149.9 11/21/2013   LDLCALC 137* 01/30/2015   ALT 14 01/30/2015   AST 15 01/30/2015   NA 138 01/30/2015   K 5.0 01/30/2015   CL 104 01/30/2015   CREATININE 0.74 01/30/2015   BUN 10 01/30/2015   CO2 29 01/30/2015   TSH 2.54 04/13/2015    No results found.  Assessment & Plan:   Problem List Items Addressed This Visit    Change in stool habits    She appears well,  Recent development of loose stools does not appear infectious in origin Advised to suspend the supplemental energy drink and obtain palin films of abdomen.         Hypothyroidism   Relevant Orders   TSH   Hyperlipidemia   Relevant Orders   LDL cholesterol, direct   Lipid panel    Other Visit Diagnoses    Other constipation    -  Primary    Relevant Orders    DG Abd 2 Views    Comprehensive metabolic panel       I have discontinued Vanessa Brown's ciprofloxacin and sulfamethoxazole-trimethoprim. I am also having her start on Red Yeast Rice. Additionally, I am having her maintain her calcium carbonate, primidone, Black Cohosh, cyanocobalamin, Biotin, Tdap, zoster vaccine live (PF), estradiol, and levothyroxine.  Meds ordered this encounter  Medications  . Red Yeast Rice 600 MG CAPS    Sig: Take 1 capsule (600 mg total) by mouth 2 (two) times daily.    Dispense:  60  capsule    Refill:  11    Medications Discontinued During This Encounter  Medication Reason  . ciprofloxacin (CIPRO) 250 MG tablet Error  . sulfamethoxazole-trimethoprim (BACTRIM DS,SEPTRA DS) 800-160 MG per tablet Completed Course  . sulfamethoxazole-trimethoprim (BACTRIM DS,SEPTRA DS) 800-160 MG per tablet Completed Course  . ciprofloxacin (CIPRO) 250 MG tablet Error    Follow-up: Return in about 6 months (around 02/08/2016).   Sherlene ShamsULLO, Tequia Wolman L, MD

## 2015-08-10 NOTE — Progress Notes (Signed)
Pre-visit discussion using our clinic review tool. No additional management support is needed unless otherwise documented below in the visit note.  

## 2015-08-12 DIAGNOSIS — R194 Change in bowel habit: Secondary | ICD-10-CM | POA: Insufficient documentation

## 2015-08-12 NOTE — Assessment & Plan Note (Signed)
She appears well,  Recent development of loose stools does not appear infectious in origin Advised to suspend the supplemental energy drink and obtain palin films of abdomen.

## 2015-08-15 ENCOUNTER — Other Ambulatory Visit (INDEPENDENT_AMBULATORY_CARE_PROVIDER_SITE_OTHER): Payer: Medicare Other

## 2015-08-15 DIAGNOSIS — K5909 Other constipation: Secondary | ICD-10-CM

## 2015-08-15 DIAGNOSIS — E034 Atrophy of thyroid (acquired): Secondary | ICD-10-CM

## 2015-08-15 DIAGNOSIS — E785 Hyperlipidemia, unspecified: Secondary | ICD-10-CM

## 2015-08-15 DIAGNOSIS — E038 Other specified hypothyroidism: Secondary | ICD-10-CM

## 2015-08-15 LAB — LIPID PANEL
CHOL/HDL RATIO: 3
Cholesterol: 258 mg/dL — ABNORMAL HIGH (ref 0–200)
HDL: 83.8 mg/dL (ref >39.00)
LDL Cholesterol: 166 mg/dL — ABNORMAL HIGH (ref 0–99)
NonHDL: 174.69
Triglycerides: 44 mg/dL (ref 0.0–149.0)
VLDL: 8.8 mg/dL (ref 0.0–40.0)

## 2015-08-15 LAB — COMPREHENSIVE METABOLIC PANEL
ALT: 22 U/L (ref 0–35)
AST: 21 U/L (ref 0–37)
Albumin: 4.3 g/dL (ref 3.5–5.2)
Alkaline Phosphatase: 90 U/L (ref 39–117)
BUN: 18 mg/dL (ref 6–23)
CHLORIDE: 104 meq/L (ref 96–112)
CO2: 28 meq/L (ref 19–32)
CREATININE: 0.88 mg/dL (ref 0.40–1.20)
Calcium: 9.9 mg/dL (ref 8.4–10.5)
GFR: 66.57 mL/min (ref >60.00)
GLUCOSE: 84 mg/dL (ref 70–99)
POTASSIUM: 4.9 meq/L (ref 3.5–5.1)
SODIUM: 140 meq/L (ref 135–145)
Total Bilirubin: 0.3 mg/dL (ref 0.2–1.2)
Total Protein: 7.5 g/dL (ref 6.0–8.3)

## 2015-08-15 LAB — LDL CHOLESTEROL, DIRECT: Direct LDL: 148 mg/dL

## 2015-08-15 LAB — TSH: TSH: 2.05 u[IU]/mL (ref 0.35–4.50)

## 2015-08-21 ENCOUNTER — Encounter: Payer: Self-pay | Admitting: *Deleted

## 2015-08-21 MED ORDER — SIMVASTATIN 20 MG PO TABS: 20.0000 mg | ORAL_TABLET | Freq: Every day | ORAL | Status: DC

## 2015-08-21 NOTE — Addendum Note (Signed)
Addended by: Sherlene ShamsULLO, TERESA L on: 08/21/2015 11:35 AM   Modules accepted: Orders, Medications

## 2015-08-27 ENCOUNTER — Ambulatory Visit
Admission: RE | Admit: 2015-08-27 | Discharge: 2015-08-27 | Disposition: A | Discharge status: Home / Self Care | Payer: Medicare Other | Source: Ambulatory Visit | Attending: Internal Medicine | Admitting: Internal Medicine

## 2015-08-27 DIAGNOSIS — K5909 Other constipation: Secondary | ICD-10-CM

## 2015-08-27 DIAGNOSIS — R194 Change in bowel habit: Secondary | ICD-10-CM | POA: Diagnosis not present

## 2015-08-27 DIAGNOSIS — K5900 Constipation, unspecified: Secondary | ICD-10-CM | POA: Insufficient documentation

## 2015-09-20 ENCOUNTER — Telehealth: Payer: Self-pay | Admitting: Surgical

## 2015-09-20 MED ORDER — ROSUVASTATIN CALCIUM 10 MG PO TABS: ORAL_TABLET | ORAL | Status: DC

## 2015-09-20 NOTE — Telephone Encounter (Signed)
Patient pharmacy sent over fax stating that patient has discontinued her Simvastatin due to causing muscle aches. They wanted to know if you would like to prescribe an alternate medication.

## 2015-09-20 NOTE — Telephone Encounter (Signed)
Left message for return call.

## 2015-09-20 NOTE — Telephone Encounter (Signed)
Every other day crestor

## 2015-09-21 NOTE — Telephone Encounter (Signed)
Patient notified and voiced understanding,  Patient advised if to expensive she will have to call back.

## 2015-11-13 ENCOUNTER — Other Ambulatory Visit: Payer: Self-pay | Admitting: Internal Medicine

## 2015-12-14 ENCOUNTER — Ambulatory Visit (INDEPENDENT_AMBULATORY_CARE_PROVIDER_SITE_OTHER): Payer: Medicare Other | Admitting: Internal Medicine

## 2015-12-14 ENCOUNTER — Encounter: Payer: Self-pay | Admitting: Internal Medicine

## 2015-12-14 VITALS — BP 118/60 | HR 68 | Temp 97.9°F | Resp 18 | Ht 63.0 in | Wt 136.8 lb

## 2015-12-14 DIAGNOSIS — Z1239 Encounter for other screening for malignant neoplasm of breast: Secondary | ICD-10-CM | POA: Diagnosis not present

## 2015-12-14 DIAGNOSIS — R5383 Other fatigue: Secondary | ICD-10-CM

## 2015-12-14 DIAGNOSIS — R110 Nausea: Secondary | ICD-10-CM | POA: Diagnosis not present

## 2015-12-14 DIAGNOSIS — M8580 Other specified disorders of bone density and structure, unspecified site: Secondary | ICD-10-CM

## 2015-12-14 DIAGNOSIS — E034 Atrophy of thyroid (acquired): Secondary | ICD-10-CM

## 2015-12-14 DIAGNOSIS — Z0000 Encounter for general adult medical examination without abnormal findings: Secondary | ICD-10-CM

## 2015-12-14 DIAGNOSIS — E785 Hyperlipidemia, unspecified: Secondary | ICD-10-CM | POA: Diagnosis not present

## 2015-12-14 DIAGNOSIS — G250 Essential tremor: Secondary | ICD-10-CM

## 2015-12-14 DIAGNOSIS — E038 Other specified hypothyroidism: Secondary | ICD-10-CM

## 2015-12-14 LAB — COMPREHENSIVE METABOLIC PANEL
ALBUMIN: 4.3 g/dL (ref 3.5–5.2)
ALT: 12 U/L (ref 0–35)
AST: 14 U/L (ref 0–37)
Alkaline Phosphatase: 89 U/L (ref 39–117)
BILIRUBIN TOTAL: 0.3 mg/dL (ref 0.2–1.2)
BUN: 15 mg/dL (ref 6–23)
CALCIUM: 9.4 mg/dL (ref 8.4–10.5)
CO2: 28 meq/L (ref 19–32)
CREATININE: 0.82 mg/dL (ref 0.40–1.20)
Chloride: 105 mEq/L (ref 96–112)
GFR: 72.16 mL/min (ref >60.00)
Glucose, Bld: 83 mg/dL (ref 70–99)
Potassium: 4.7 mEq/L (ref 3.5–5.1)
SODIUM: 140 meq/L (ref 135–145)
Total Protein: 7.2 g/dL (ref 6.0–8.3)

## 2015-12-14 LAB — LIPID PANEL
CHOLESTEROL: 206 mg/dL — AB (ref 0–200)
HDL: 79.7 mg/dL (ref >39.00)
LDL CALC: 114 mg/dL — AB (ref 0–99)
NONHDL: 126.52
Total CHOL/HDL Ratio: 3
Triglycerides: 65 mg/dL (ref 0.0–149.0)
VLDL: 13 mg/dL (ref 0.0–40.0)

## 2015-12-14 LAB — T4, FREE: FREE T4: 1.32 ng/dL (ref 0.60–1.60)

## 2015-12-14 LAB — LDL CHOLESTEROL, DIRECT: LDL DIRECT: 93 mg/dL

## 2015-12-14 MED ORDER — TETANUS-DIPHTH-ACELL PERTUSSIS 5-2.5-18.5 LF-MCG/0.5 IM SUSP: 0.5000 mL | Freq: Once | INTRAMUSCULAR | Status: DC

## 2015-12-14 NOTE — Progress Notes (Signed)
Patient ID: Vanessa Brown, female    DOB: 1940/12/30  Age: 75 y.o. MRN: [id number]  The patient is here for annual Medicare wellness examination and management of other chronic and acute problems.   Needs DEXA . Last one was over 5 yrs ago at [facility] COLON CA SCREEN NO POLYPS 2015 HAYES  5 YR follow up due in 2020 Wondering if she needs thyroid medication Since she was always borderline  Needs mammogram Using primodone 125 mg twice daily due to lethargy at higher dose (for tremors)     The risk factors are reflected in the social history.  The roster of all physicians providing medical care to patient - is listed in the Snapshot section of the chart.  Activities of daily living:  The patient is 100% independent in all ADLs: dressing, toileting, feeding as well as independent mobility  Home safety : The patient has smoke detectors in the home. They wear seatbelts.  There are no firearms at home. There is no violence in the home.   There is no risks for hepatitis, STDs or HIV. There is no   history of blood transfusion. They have no travel history to infectious disease endemic areas of the world.  The patient has seen their dentist in the last six month. They have seen their eye doctor in the last year. They admit to slight hearing difficulty with regard to whispered voices and some television programs.  They have deferred audiologic testing in the last year.  They do not  have excessive sun exposure. Discussed the need for sun protection: hats, long sleeves and use of sunscreen if there is significant sun exposure.   Diet: the importance of a healthy diet is discussed. They do have a healthy diet.  The benefits of regular aerobic exercise were discussed. She  Dances  4 times per week ,  30 minutes.   Depression screen: there are no signs or vegative symptoms of depression- irritability, change in appetite, anhedonia, sadness/tearfullness.  Cognitive assessment: the patient manages all their  financial and personal affairs and is actively engaged. They could relate day,date,year and events; recalled 2/3 objects at 3 minutes; performed clock-face test normally.  The following portions of the patient's history were reviewed and updated as appropriate: allergies, current medications, past family history, past medical history,  past surgical history, past social history  and problem list.  Visual acuity was not assessed per patient preference since she has regular follow up with her ophthalmologist. Hearing and body mass index were assessed and reviewed.   During the course of the visit the patient was educated and counseled about appropriate screening and preventive services including : fall prevention , diabetes screening, nutrition counseling, colorectal cancer screening, and recommended immunizations.    CC: The primary encounter diagnosis was Hypothyroidism due to acquired atrophy of thyroid. Diagnoses of Hyperlipidemia, Nausea without vomiting, Breast cancer screening, Osteopenia, Medicare annual wellness visit, subsequent, Benign essential tremor, Other fatigue, and Hyperlipidemia LDL goal <130 were also pertinent to this visit.  Did not tolerate crestor every other day due to myalgias. Taking  RYR twice daily  History Vanessa Brown has a past medical history of Anxiety; Irritable bowel syndrome (IBS); Arthritis; Screening for breast cancer; Screening for cervical cancer; Tremor, essential; GERD (gastroesophageal reflux disease); Gastric ulcer; Duodenal ulcer; Neuropathy (HCC); and Cervical back pain with evidence of disc disease (2007).   She has past surgical history that includes Rotator cuff repair (2004); Biopsy breast; Abdominal hysterectomy (1967); and Breast surgery.  Her family history includes Diabetes in her mother.She reports that she quit smoking about 16 years ago. Her smoking use included Cigarettes. She has never used smokeless tobacco. She reports that she drinks alcohol. She  reports that she does not use illicit drugs.  Outpatient Prescriptions Prior to Visit  Medication Sig Dispense Refill  . Biotin 5000 MCG CAPS Take 1 capsule by mouth daily.    . Black Cohosh 540 MG CAPS Take 1 capsule by mouth daily.    . cyanocobalamin 1000 MCG tablet Take 1,000 mcg by mouth daily.    Marland Kitchen estradiol (CLIMARA - DOSED IN MG/24 HR) 0.025 mg/24hr patch apply 1 patch every week 4 patch 11  . levothyroxine (SYNTHROID, LEVOTHROID) 50 MCG tablet Take 1 tablet (50 mcg total) by mouth daily. 90 tablet 3  . primidone (MYSOLINE) 50 MG tablet Take 250 mg by mouth 2 (two) times daily.     . rosuvastatin (CRESTOR) 10 MG tablet 1 tablet every other day at bedtime 90 tablet 0  . calcium carbonate (OS-CAL) 600 MG TABS Take 600 mg by mouth daily with breakfast. Reported on 12/14/2015    . zoster vaccine live, PF, (ZOSTAVAX) 81191 UNT/0.65ML injection Inject 19,400 Units into the skin once. (Patient not taking: Reported on 08/10/2015) 1 each 0  . simvastatin (ZOCOR) 20 MG tablet Take 1 tablet (20 mg total) by mouth at bedtime. (Patient not taking: Reported on 12/14/2015) 30 tablet 3  . Tdap (BOOSTRIX) 5-2.5-18.5 LF-MCG/0.5 injection Inject 0.5 mLs into the muscle once. (Patient not taking: Reported on 08/10/2015) 0.5 mL 0   No facility-administered medications prior to visit.    Review of Systems  Patient denies headache, fevers, malaise, unintentional weight loss, skin rash, eye pain, sinus congestion and sinus pain, sore throat, dysphagia,  hemoptysis , cough, dyspnea, wheezing, chest pain, palpitations, orthopnea, edema, abdominal pain, nausea, melena, diarrhea, constipation, flank pain, dysuria, hematuria, urinary  Frequency, nocturia, numbness, tingling, seizures,  Focal weakness, Loss of consciousness,  Tremor, insomnia, depression, anxiety, and suicidal ideation.     Objective:  BP 118/60 mmHg  Pulse 68  Temp(Src) 97.9 F (36.6 C) (Oral)  Resp 18  Ht  (1.6 m)  Wt 136 lb 13 oz  (62.058 kg)  BMI 24.24 kg/m2  SpO2 93%  Physical Exam  General appearance: alert, cooperative and appears stated age Head: Normocephalic, without obvious abnormality, atraumatic Eyes: conjunctivae/corneas clear. PERRL, EOM's intact. Fundi benign. Ears: normal TM's and external ear canals both ears Nose: Nares normal. Septum midline. Mucosa normal. No drainage or sinus tenderness. Throat: lips, mucosa, and tongue normal; teeth and gums normal Neck: no adenopathy, no carotid bruit, no JVD, supple, symmetrical, trachea midline and thyroid not enlarged, symmetric, no tenderness/mass/nodules Lungs: clear to auscultation bilaterally Breasts: normal appearance, no masses or tenderness Heart: regular rate and rhythm, S1, S2 normal, no murmur, click, rub or gallop Abdomen: soft, non-tender; bowel sounds normal; no masses,  no organomegaly Extremities: extremities normal, atraumatic, no cyanosis or edema Pulses: 2+ and symmetric Skin: Skin color, texture, turgor normal. No rashes or lesions Neurologic: Alert and oriented X 3, normal strength and tone. Normal symmetric reflexes. Normal coordination and gait.    Assessment & Plan:   Problem List Items Addressed This Visit    Benign essential tremor    Managed with low dose primidone. No progression .      Osteopenia    Repeat DEXA is due      Relevant Orders   DG Bone Density  Medicare annual wellness visit, subsequent    Annual comprehensive preventive exam was done as well as an evaluation and management of chronic conditions .  During the course of the visit the patient was educated and counseled about appropriate screening and preventive services including :  diabetes screening, lipid analysis with projected  10 year  risk for CAD , nutrition counseling, breast, cervical and colorectal cancer screening, and recommended immunizations.  Printed recommendations for health maintenance screenings was give      Hyperlipidemia LDL goal <130     She did not tolerate low dose crestor.  continue red yeast rice.   Lab Results  Component Value Date   CHOL 206* 12/14/2015   HDL 79.70 12/14/2015   LDLCALC 114* 12/14/2015   LDLDIRECT 93.0 12/14/2015   TRIG 65.0 12/14/2015   CHOLHDL 3 12/14/2015   Lab Results  Component Value Date   ALT 12 12/14/2015   AST 14 12/14/2015   ALKPHOS 89 12/14/2015   BILITOT 0.3 12/14/2015         RESOLVED: Hyperlipidemia   Relevant Orders   LDL cholesterol, direct (Completed)   Lipid panel (Completed)   Fatigue - Primary    She has no evidence of underactive thyroid by prior TSH and Free T4  Lab Results  Component Value Date   TSH 2.05 08/15/2015          Other Visit Diagnoses    Nausea without vomiting        Relevant Orders    Comprehensive metabolic panel (Completed)    Breast cancer screening        Relevant Orders    MM DIGITAL SCREENING BILATERAL       I have discontinued Vanessa Brown's simvastatin and rosuvastatin. I am also having her maintain her calcium carbonate, primidone, Black Cohosh, cyanocobalamin, Biotin, zoster vaccine live (PF), levothyroxine, estradiol, and Tdap.  Meds ordered this encounter  Medications  . Tdap (BOOSTRIX) 5-2.5-18.5 LF-MCG/0.5 injection    Sig: Inject 0.5 mLs into the muscle once.    Dispense:  0.5 mL    Refill:  0    Medications Discontinued During This Encounter  Medication Reason  . simvastatin (ZOCOR) 20 MG tablet   . rosuvastatin (CRESTOR) 10 MG tablet   . Tdap (BOOSTRIX) 5-2.5-18.5 LF-MCG/0.5 injection Reorder    Follow-up: No Follow-up on file.   Sherlene Shams, MD

## 2015-12-14 NOTE — Patient Instructions (Addendum)
I will order your annual mammogram  You can stop the thyroid medication and we will check your thyroid function in 2 months with your next lab draw  You need a bone density test,  We will order   Menopause is a normal process in which your reproductive ability comes to an end. This process happens gradually over a span of months to years, usually between the ages of 37 and 63. Menopause is complete when you have missed 12 consecutive menstrual periods. It is important to talk with your health care provider about some of the most common conditions that affect postmenopausal women, such as heart disease, cancer, and bone loss (osteoporosis). Adopting a healthy lifestyle and getting preventive care can help to promote your health and wellness. Those actions can also lower your chances of developing some of these common conditions. WHAT SHOULD I KNOW ABOUT MENOPAUSE? During menopause, you may experience a number of symptoms, such as:  Moderate-to-severe hot flashes.  Night sweats.  Decrease in sex drive.  Mood swings.  Headaches.  Tiredness.  Irritability.  Memory problems.  Insomnia. Choosing to treat or not to treat menopausal changes is an individual decision that you make with your health care provider. WHAT SHOULD I KNOW ABOUT HORMONE REPLACEMENT THERAPY AND SUPPLEMENTS? Hormone therapy products are effective for treating symptoms that are associated with menopause, such as hot flashes and night sweats. Hormone replacement carries certain risks, especially as you become older. If you are thinking about using estrogen or estrogen with progestin treatments, discuss the benefits and risks with your health care provider. WHAT SHOULD I KNOW ABOUT HEART DISEASE AND STROKE? Heart disease, heart attack, and stroke become more likely as you age. This may be due, in part, to the hormonal changes that your body experiences during menopause. These can affect how your body processes dietary fats,  triglycerides, and cholesterol. Heart attack and stroke are both medical emergencies. There are many things that you can do to help prevent heart disease and stroke:  Have your blood pressure checked at least every 1-2 years. High blood pressure causes heart disease and increases the risk of stroke.  If you are 72-54 years old, ask your health care provider if you should take aspirin to prevent a heart attack or a stroke.  Do not use any tobacco products, including cigarettes, chewing tobacco, or electronic cigarettes. If you need help quitting, ask your health care provider.  It is important to eat a healthy diet and maintain a healthy weight.  Be sure to include plenty of vegetables, fruits, low-fat dairy products, and lean protein.  Avoid eating foods that are high in solid fats, added sugars, or salt (sodium).  Get regular exercise. This is one of the most important things that you can do for your health.  Try to exercise for at least 150 minutes each week. The type of exercise that you do should increase your heart rate and make you sweat. This is known as moderate-intensity exercise.  Try to do strengthening exercises at least twice each week. Do these in addition to the moderate-intensity exercise.  Know your numbers.Ask your health care provider to check your cholesterol and your blood glucose. Continue to have your blood tested as directed by your health care provider. WHAT SHOULD I KNOW ABOUT CANCER SCREENING? There are several types of cancer. Take the following steps to reduce your risk and to catch any cancer development as early as possible. Breast Cancer  Practice breast self-awareness.  This means  understanding how your breasts normally appear and feel.  It also means doing regular breast self-exams. Let your health care provider know about any changes, no matter how small.  If you are 33 or older, have a clinician do a breast exam (clinical breast exam or CBE) every  year. Depending on your age, family history, and medical history, it may be recommended that you also have a yearly breast X-ray (mammogram).  If you have a family history of breast cancer, talk with your health care provider about genetic screening.  If you are at high risk for breast cancer, talk with your health care provider about having an MRI and a mammogram every year.  Breast cancer (BRCA) gene test is recommended for women who have family members with BRCA-related cancers. Results of the assessment will determine the need for genetic counseling and BRCA1 and for BRCA2 testing. BRCA-related cancers include these types:  Breast. This occurs in males or females.  Ovarian.  Tubal. This may also be called fallopian tube cancer.  Cancer of the abdominal or pelvic lining (peritoneal cancer).  Prostate.  Pancreatic. Cervical, Uterine, and Ovarian Cancer Your health care provider may recommend that you be screened regularly for cancer of the pelvic organs. These include your ovaries, uterus, and vagina. This screening involves a pelvic exam, which includes checking for microscopic changes to the surface of your cervix (Pap test).  For women ages 21-65, health care providers may recommend a pelvic exam and a Pap test every three years. For women ages 72-65, they may recommend the Pap test and pelvic exam, combined with testing for human papilloma virus (HPV), every five years. Some types of HPV increase your risk of cervical cancer. Testing for HPV may also be done on women of any age who have unclear Pap test results.  Other health care providers may not recommend any screening for nonpregnant women who are considered low risk for pelvic cancer and have no symptoms. Ask your health care provider if a screening pelvic exam is right for you.  If you have had past treatment for cervical cancer or a condition that could lead to cancer, you need Pap tests and screening for cancer for at least 20  years after your treatment. If Pap tests have been discontinued for you, your risk factors (such as having a new sexual partner) need to be reassessed to determine if you should start having screenings again. Some women have medical problems that increase the chance of getting cervical cancer. In these cases, your health care provider may recommend that you have screening and Pap tests more often.  If you have a family history of uterine cancer or ovarian cancer, talk with your health care provider about genetic screening.  If you have vaginal bleeding after reaching menopause, tell your health care provider.  There are currently no reliable tests available to screen for ovarian cancer. Lung Cancer Lung cancer screening is recommended for adults 98-50 years old who are at high risk for lung cancer because of a history of smoking. A yearly low-dose CT scan of the lungs is recommended if you:  Currently smoke.  Have a history of at least 30 pack-years of smoking and you currently smoke or have quit within the past 15 years. A pack-year is smoking an average of one pack of cigarettes per day for one year. Yearly screening should:  Continue until it has been 15 years since you quit.  Stop if you develop a health problem that would  prevent you from having lung cancer treatment. Colorectal Cancer  This type of cancer can be detected and can often be prevented.  Routine colorectal cancer screening usually begins at age 16 and continues through age 43.  If you have risk factors for colon cancer, your health care provider may recommend that you be screened at an earlier age.  If you have a family history of colorectal cancer, talk with your health care provider about genetic screening.  Your health care provider may also recommend using home test kits to check for hidden blood in your stool.  A small camera at the end of a tube can be used to examine your colon directly (sigmoidoscopy or  colonoscopy). This is done to check for the earliest forms of colorectal cancer.  Direct examination of the colon should be repeated every 5-10 years until age 72. However, if early forms of precancerous polyps or small growths are found or if you have a family history or genetic risk for colorectal cancer, you may need to be screened more often. Skin Cancer  Check your skin from head to toe regularly.  Monitor any moles. Be sure to tell your health care provider:  About any new moles or changes in moles, especially if there is a change in a mole's shape or color.  If you have a mole that is larger than the size of a pencil eraser.  If any of your family members has a history of skin cancer, especially at a young age, talk with your health care provider about genetic screening.  Always use sunscreen. Apply sunscreen liberally and repeatedly throughout the day.  Whenever you are outside, protect yourself by wearing long sleeves, pants, a wide-brimmed hat, and sunglasses. WHAT SHOULD I KNOW ABOUT OSTEOPOROSIS? Osteoporosis is a condition in which bone destruction happens more quickly than new bone creation. After menopause, you may be at an increased risk for osteoporosis. To help prevent osteoporosis or the bone fractures that can happen because of osteoporosis, the following is recommended:  If you are 69-13 years old, get at least 1,000 mg of calcium and at least 600 mg of vitamin D per day.  If you are older than age 87 but younger than age 55, get at least 1,200 mg of calcium and at least 600 mg of vitamin D per day.  If you are older than age 20, get at least 1,200 mg of calcium and at least 800 mg of vitamin D per day. Smoking and excessive alcohol intake increase the risk of osteoporosis. Eat foods that are rich in calcium and vitamin D, and do weight-bearing exercises several times each week as directed by your health care provider. WHAT SHOULD I KNOW ABOUT HOW MENOPAUSE AFFECTS [place]? Depression may occur at any age, but it is more common as you become older. Common symptoms of depression include:  Low or sad mood.  Changes in sleep patterns.  Changes in appetite or eating patterns.  Feeling an overall lack of motivation or enjoyment of activities that you previously enjoyed.  Frequent crying spells. Talk with your health care provider if you think that you are experiencing depression. WHAT SHOULD I KNOW ABOUT IMMUNIZATIONS? It is important that you get and maintain your immunizations. These include:  Tetanus, diphtheria, and pertussis (Tdap) booster vaccine.  Influenza every year before the flu season begins.  Pneumonia vaccine.  Shingles vaccine. Your health care provider may also recommend other immunizations.   This information is not intended  to replace advice given to you by your health care provider. Make sure you discuss any questions you have with your health care provider.   Document Released: 11/28/2005 Document Revised: 10/27/2014 Document Reviewed: 06/08/2014 Elsevier Interactive Patient Education [employer].

## 2015-12-14 NOTE — Progress Notes (Signed)
Pre visit review using our clinic review tool, if applicable. No additional management support is needed unless otherwise documented below in the visit note. 

## 2015-12-16 ENCOUNTER — Encounter: Payer: Self-pay | Admitting: Internal Medicine

## 2015-12-16 NOTE — Assessment & Plan Note (Signed)
Managed with low dose primidone. No progression .

## 2015-12-16 NOTE — Assessment & Plan Note (Addendum)
She did not tolerate low dose crestor.  continue red yeast rice.   Lab Results  Component Value Date   CHOL 206* 12/14/2015   HDL 79.70 12/14/2015   LDLCALC 114* 12/14/2015   LDLDIRECT 93.0 12/14/2015   TRIG 65.0 12/14/2015   CHOLHDL 3 12/14/2015   Lab Results  Component Value Date   ALT 12 12/14/2015   AST 14 12/14/2015   ALKPHOS 89 12/14/2015   BILITOT 0.3 12/14/2015

## 2015-12-16 NOTE — Assessment & Plan Note (Addendum)
She has no evidence of underactive thyroid by prior TSH and Free T4  Lab Results  Component Value Date   TSH 2.05 08/15/2015

## 2015-12-16 NOTE — Assessment & Plan Note (Signed)
Repeat DEXA is due

## 2015-12-16 NOTE — Assessment & Plan Note (Signed)
Annual comprehensive preventive exam was done as well as an evaluation and management of chronic conditions .  During the course of the visit the patient was educated and counseled about appropriate screening and preventive services including :  diabetes screening, lipid analysis with projected  10 year  risk for CAD , nutrition counseling, breast, cervical and colorectal cancer screening, and recommended immunizations.  Printed recommendations for health maintenance screenings was give 

## 2015-12-19 ENCOUNTER — Other Ambulatory Visit: Payer: Self-pay | Admitting: Surgical

## 2015-12-19 MED ORDER — LEVOTHYROXINE SODIUM 50 MCG PO TABS: 50.0000 ug | ORAL_TABLET | Freq: Every day | ORAL | Status: DC

## 2015-12-26 ENCOUNTER — Encounter: Payer: Self-pay | Admitting: Internal Medicine

## 2016-01-14 ENCOUNTER — Other Ambulatory Visit: Payer: Self-pay | Admitting: Internal Medicine

## 2016-01-14 ENCOUNTER — Ambulatory Visit
Admission: RE | Admit: 2016-01-14 | Discharge: 2016-01-14 | Disposition: A | Discharge status: Home / Self Care | Payer: Medicare Other | Source: Ambulatory Visit | Attending: Internal Medicine | Admitting: Internal Medicine

## 2016-01-14 DIAGNOSIS — Z1231 Encounter for screening mammogram for malignant neoplasm of breast: Secondary | ICD-10-CM | POA: Insufficient documentation

## 2016-01-14 DIAGNOSIS — Z780 Asymptomatic menopausal state: Secondary | ICD-10-CM | POA: Diagnosis not present

## 2016-01-14 DIAGNOSIS — Z1239 Encounter for other screening for malignant neoplasm of breast: Secondary | ICD-10-CM

## 2016-01-14 DIAGNOSIS — M85851 Other specified disorders of bone density and structure, right thigh: Secondary | ICD-10-CM | POA: Diagnosis not present

## 2016-01-14 DIAGNOSIS — M8580 Other specified disorders of bone density and structure, unspecified site: Secondary | ICD-10-CM | POA: Diagnosis not present

## 2016-01-14 DIAGNOSIS — Z13820 Encounter for screening for osteoporosis: Secondary | ICD-10-CM | POA: Diagnosis not present

## 2016-01-16 ENCOUNTER — Encounter: Payer: Self-pay | Admitting: Internal Medicine

## 2016-01-17 ENCOUNTER — Encounter: Payer: Self-pay | Admitting: Internal Medicine

## 2016-01-31 NOTE — Telephone Encounter (Signed)
Mailed unread message to patient.  

## 2016-07-10 ENCOUNTER — Encounter: Payer: Self-pay | Admitting: Family

## 2016-07-10 ENCOUNTER — Ambulatory Visit (INDEPENDENT_AMBULATORY_CARE_PROVIDER_SITE_OTHER): Payer: Medicare Other | Admitting: Family

## 2016-07-10 VITALS — BP 138/68 | HR 77 | Temp 97.9°F | Ht 62.0 in | Wt 132.6 lb

## 2016-07-10 DIAGNOSIS — R197 Diarrhea, unspecified: Secondary | ICD-10-CM | POA: Diagnosis not present

## 2016-07-10 DIAGNOSIS — R05 Cough: Secondary | ICD-10-CM

## 2016-07-10 DIAGNOSIS — J0120 Acute ethmoidal sinusitis, unspecified: Secondary | ICD-10-CM | POA: Diagnosis not present

## 2016-07-10 DIAGNOSIS — R059 Cough, unspecified: Secondary | ICD-10-CM

## 2016-07-10 MED ORDER — ALBUTEROL SULFATE HFA 108 (90 BASE) MCG/ACT IN AERS: 2.0000 | INHALATION_SPRAY | Freq: Four times a day (QID) | RESPIRATORY_TRACT | 1 refills | Status: DC | PRN

## 2016-07-10 MED ORDER — DOXYCYCLINE HYCLATE 100 MG PO TABS: 100.0000 mg | ORAL_TABLET | Freq: Two times a day (BID) | ORAL | 0 refills | Status: DC

## 2016-07-10 NOTE — Progress Notes (Signed)
Pre visit review using our clinic review tool, if applicable. No additional management support is needed unless otherwise documented below in the visit note. 

## 2016-07-10 NOTE — Patient Instructions (Signed)

## 2016-07-10 NOTE — Progress Notes (Signed)
Subjective:    Patient ID: Vanessa Brown, female    DOB: 08-23-1941, 75 y.o.   MRN: [id number]  CC: Vanessa Brown is a 75 y.o. female who presents today for an acute visit.    HPI: Patient here for acute visit for sinus congestion for 5 days, worsening. Endorses chills, productive cough, sore throat, occasional wheezing. Has started diarrhea 2x per day yesterday, unchanged. 'Has sensitive stomach.' Non bloody. Patient has been taking Mucinex and Sudafed with mild relief. No abdominal pain, vomiting, SOB. Thought triggered by mowing the yard a week however not improving.  History of asthma or lung disease. Patient is a former smoker.    HISTORY:  Past Medical History:  Diagnosis Date  . Anxiety   . Arthritis    spine, prior [facility] Clinic patient  . Cervical back pain with evidence of disc disease 2007   prior epidural steorid injection s  . Duodenal ulcer   . Gastric ulcer    H Pylori positive  . GERD (gastroesophageal reflux disease)   . Irritable bowel syndrome (IBS)    Dorena Cookey, [facility]  . Neuropathy (HCC)    "toilet seat", Sherryll Burger), caused  by sitting on commode  . Screening for breast cancer   . Screening for cervical cancer   . Tremor, essential    treated with primidone 25 mg qhs   Past Surgical History:  Procedure Laterality Date  . ABDOMINAL HYSTERECTOMY  1967  . BIOPSY BREAST     benign,. right breast ([facility])  . BREAST BIOPSY Bilateral    Neg- core  . BREAST SURGERY     [facility], benign biopsies  . ROTATOR CUFF REPAIR  2004   right shoulder , [place]   Family History  Problem Relation Age of Onset  . Diabetes Mother   . Breast cancer Neg Hx     Allergies: Bee venom; Iodinated diagnostic agents; and Shellfish allergy Current Outpatient Prescriptions on File Prior to Visit  Medication Sig Dispense Refill  . Biotin 5000 MCG CAPS Take 1 capsule by mouth daily.    . Black Cohosh 540 MG CAPS Take 1 capsule by mouth daily.    . calcium carbonate (OS-CAL) 600 MG  TABS Take 600 mg by mouth daily with breakfast. Reported on 12/14/2015    . cyanocobalamin 1000 MCG tablet Take 1,000 mcg by mouth daily.    Marland Kitchen estradiol (CLIMARA - DOSED IN MG/24 HR) 0.025 mg/24hr patch apply 1 patch every week 4 patch 11  . levothyroxine (SYNTHROID, LEVOTHROID) 50 MCG tablet Take 1 tablet (50 mcg total) by mouth daily. 90 tablet 2  . primidone (MYSOLINE) 50 MG tablet Take 250 mg by mouth 2 (two) times daily.     . Tdap (BOOSTRIX) 5-2.5-18.5 LF-MCG/0.5 injection Inject 0.5 mLs into the muscle once. 0.5 mL 0  . zoster vaccine live, PF, (ZOSTAVAX) 40981 UNT/0.65ML injection Inject 19,400 Units into the skin once. 1 each 0   No current facility-administered medications on file prior to visit.     Social History  Substance Use Topics  . Smoking status: Former Smoker    Types: Cigarettes    Quit date: 09/03/1999  . Smokeless tobacco: Never Used  . Alcohol use 0.0 oz/week    Review of Systems  Constitutional: Positive for chills. Negative for fever.  HENT: Positive for congestion, sinus pressure and sore throat.   Respiratory: Positive for cough and wheezing. Negative for shortness of breath.   Cardiovascular: Negative for chest pain and  palpitations.  Gastrointestinal: Positive for diarrhea. Negative for abdominal distention, abdominal pain, blood in stool, nausea and vomiting.      Objective:    BP 138/68   Pulse 77   Temp 97.9 F (36.6 C) (Oral)   Ht 5\' 2"  (1.575 m)   Wt 132 lb 9.6 oz (60.1 kg)   SpO2 96%   BMI 24.25 kg/m    Physical Exam  Constitutional: She appears well-developed and well-nourished.  HENT:  Head: Normocephalic and atraumatic.  Right Ear: Hearing, tympanic membrane, external ear and ear canal normal. No drainage, swelling or tenderness. No foreign bodies. Tympanic membrane is not erythematous and not bulging. No middle ear effusion. No decreased hearing is noted.  Left Ear: Hearing, tympanic membrane, external ear and ear canal normal. No  drainage, swelling or tenderness. No foreign bodies. Tympanic membrane is not erythematous and not bulging.  No middle ear effusion. No decreased hearing is noted.  Nose: No rhinorrhea. Right sinus exhibits maxillary sinus tenderness. Right sinus exhibits no frontal sinus tenderness. Left sinus exhibits maxillary sinus tenderness. Left sinus exhibits no frontal sinus tenderness.  Mouth/Throat: Uvula is midline, oropharynx is clear and moist and mucous membranes are normal. No oropharyngeal exudate, posterior oropharyngeal edema, posterior oropharyngeal erythema or tonsillar abscesses.  Eyes: Conjunctivae are normal.  Cardiovascular: Regular rhythm, normal heart sounds and normal pulses.   Pulmonary/Chest: Effort normal and breath sounds normal. She has no wheezes. She has no rhonchi. She has no rales.  Abdominal: Soft. Normal appearance and bowel sounds are normal. She exhibits no distension, no fluid wave, no ascites and no mass. There is no tenderness. There is no rigidity, no guarding and negative Murphy's sign.  Lymphadenopathy:       Head (right side): No submental, no submandibular, no tonsillar, no preauricular, no posterior auricular and no occipital adenopathy present.       Head (left side): No submental, no submandibular, no tonsillar, no preauricular, no posterior auricular and no occipital adenopathy present.    She has no cervical adenopathy.  Neurological: She is alert.  Skin: Skin is warm and dry.  Psychiatric: She has a normal mood and affect. Her speech is normal and behavior is normal. Thought content normal.  Vitals reviewed.      Assessment & Plan:  1. Diarrhea, unspecified type Reassured by benign abdominal exam.. Afebrile.Nonbloody diarrhea for 2 days. Advised patient to let us know if she has any abdominal pain or if diarrhea is worsened by antibiotic therapy. Advised probiotics.   2. Acute ethmoidal sinusitis, recurrence not specified Based on duration of symptoms and  worsening, we jointly agreed to start antibiotic therapy. Patient states sensitive stomach to PCN/Augmentin, we decided second line agent, doxycycline. -Doxcycline  3. Cough No adventitious lung sounds. Occasional wheeze reported. SaO2 96. -Albuterol inhaler.     I am having Ms. Vanessa Brown maintain her calcium carbonate, primidone, Black Cohosh, cyanocobalamin, Biotin, zoster vaccine live (PF), estradiol, Tdap, and levothyroxine.   No orders of the defined types were placed in this encounter.   Return precautions given.   Risks, benefits, and alternatives of the medications and treatment plan prescribed today were discussed, and patient expressed understanding.   Education regarding symptom management and diagnosis given to patient on AVS.  Continue to follow with TULLO, Mar DaringERESA L, MD for routine health maintenance.   Vanessa LeveringAnn S Papania and I agreed with plan.   Rennie PlowmanMargaret Rahul Malinak, FNP

## 2016-07-14 ENCOUNTER — Telehealth: Payer: Self-pay | Admitting: Internal Medicine

## 2016-07-14 NOTE — Telephone Encounter (Signed)
Pt is feeling better but still has a little congestion and a little cough. Pt stated no S.O.B. She is stopping the doxycycline has of now she took a pill this morning and had a total of one left. Pt does not want amoxicillin.  The rash is located on her legs, chest, and stomach. Pt was told to give a few days after stopping the doxy to see if rash clears up. Pt wanted to know if benadryl was okay to be taking for the itching?

## 2016-07-14 NOTE — Telephone Encounter (Signed)
Thanks.   Benadryl okay.

## 2016-07-14 NOTE — Telephone Encounter (Signed)
Pt called stated that she saw Vanessa Brown on Friday. [name] broke out on her chest and stomach over the weekend, unsure if it is due to the meds prescribed. Please advise. Thank you!  Call pt @ [phone]

## 2016-07-14 NOTE — Telephone Encounter (Signed)
Pt made aware

## 2016-07-14 NOTE — Telephone Encounter (Signed)
Please call patient-  Doxy can cause rash , though rare. Please stop medication. Is rash improving ?  How are her sinus congestion symptoms?   Please triage and ensure no SOB. Please add doxy as allergy after speaking with patient.  If congestion isn't better, I would like to start amoxicillin ( this isn't patient's favorite) however it is first line for sinus infection.

## 2016-07-14 NOTE — Telephone Encounter (Signed)
Pt was given albuterol and doxycycline. Would this given this reaction?

## 2016-07-17 ENCOUNTER — Telehealth: Payer: Self-pay | Admitting: Internal Medicine

## 2016-07-17 NOTE — Telephone Encounter (Signed)
Pt called back regarding the rash that she has. She says that the rash is still there and has not spread and and not as itchy. She is concerned that it may be shingles.   Call pt @ [phone]

## 2016-07-17 NOTE — Telephone Encounter (Signed)
Pt schedule for a 15 acute with Claris CheMargaret tomorrow at 1:15.  Thank you!

## 2016-07-17 NOTE — Telephone Encounter (Signed)
Offer her an appt with [facility] tomorrow. If can not wait until tomorrow she can go to urgent care or [facility] at the hospital.

## 2016-07-18 ENCOUNTER — Encounter: Payer: Self-pay | Admitting: Family

## 2016-07-18 ENCOUNTER — Ambulatory Visit (INDEPENDENT_AMBULATORY_CARE_PROVIDER_SITE_OTHER): Payer: Medicare Other | Admitting: Family

## 2016-07-18 DIAGNOSIS — R21 Rash and other nonspecific skin eruption: Secondary | ICD-10-CM | POA: Diagnosis not present

## 2016-07-18 NOTE — Assessment & Plan Note (Addendum)
Reassured that rash is resolving. No SOB, dysphagia. Rash onset was 5 days after starting doxycycline however rash is a known, yet rare, side effect of doxycycline. Patient seems to be an allergic person with a severe allergy to shellfish which appeared later in life. I advised her to not take doxycycline and it is listed as a drug allergy for patient. Advised patient to let us know if rash does not completely resolve.

## 2016-07-18 NOTE — Progress Notes (Signed)
Subjective:    Patient ID: Vanessa LeveringAnn S Brown, female    DOB: 10/09/1941, 75 y.o.   MRN: [id number]  CC: Vanessa Leveringnn S Habibi is a 75 y.o. female who presents today for an acute visit.    HPI: Patient here for acute visit for evaluation of drug rash. She was seen 8 days ago for sinuisitis and started on doxycycline which is 'much better.' Minimal nasal congestion at this time. No fever.   Noted rash on chest and tops of legs which started started 5 days after starting medication. Describes as itchy, improving. No rash on hands or in mouth. Hasn't been taking any medication for rash. No fever, chills, SOB, dysphagia.   Reports recent shellfish allergy with rash and lip swelling. She is also allergic to bee venom. No eczema or asthma.     HISTORY:  Past Medical History:  Diagnosis Date  . Anxiety   . Arthritis    spine, prior Pain Clinic patient  . Cervical back pain with evidence of disc disease 2007   prior epidural steorid injection s  . Duodenal ulcer   . Gastric ulcer    H Pylori positive  . GERD (gastroesophageal reflux disease)   . Irritable bowel syndrome (IBS)    Dorena CookeyJohn Hayes, [facility]  . Neuropathy (HCC)    "toilet seat", Sherryll Burger([place]), caused  by sitting on commode  . Screening for breast cancer   . Screening for cervical cancer   . Tremor, essential    treated with primidone 25 mg qhs   Past Surgical History:  Procedure Laterality Date  . ABDOMINAL HYSTERECTOMY  1967  . BIOPSY BREAST     benign,. right breast ([facility])  . BREAST BIOPSY Bilateral    Neg- core  . BREAST SURGERY     [facility], benign biopsies  . ROTATOR CUFF REPAIR  2004   right shoulder , [place]   Family History  Problem Relation Age of Onset  . Diabetes Mother   . Breast cancer Neg Hx     Allergies: Bee venom; Iodinated diagnostic agents; Shellfish allergy; and Doxycycline Current Outpatient Prescriptions on File Prior to Visit  Medication Sig Dispense Refill  . albuterol (PROVENTIL HFA) 108 (90 Base) MCG/ACT  inhaler Inhale 2 puffs into the lungs every 6 (six) hours as needed for wheezing or shortness of breath. 1 Inhaler 1  . Biotin 5000 MCG CAPS Take 1 capsule by mouth daily.    . Black Cohosh 540 MG CAPS Take 1 capsule by mouth daily.    . calcium carbonate (OS-CAL) 600 MG TABS Take 600 mg by mouth daily with breakfast. Reported on 12/14/2015    . cyanocobalamin 1000 MCG tablet Take 1,000 mcg by mouth daily.    Marland Kitchen. estradiol (CLIMARA - DOSED IN MG/24 HR) 0.025 mg/24hr patch apply 1 patch every week 4 patch 11  . levothyroxine (SYNTHROID, LEVOTHROID) 50 MCG tablet Take 1 tablet (50 mcg total) by mouth daily. 90 tablet 2  . primidone (MYSOLINE) 50 MG tablet Take 250 mg by mouth 2 (two) times daily.     . Tdap (BOOSTRIX) 5-2.5-18.5 LF-MCG/0.5 injection Inject 0.5 mLs into the muscle once. 0.5 mL 0  . zoster vaccine live, PF, (ZOSTAVAX) [id number] UNT/0.65ML injection Inject 19,400 Units into the skin once. 1 each 0   No current facility-administered medications on file prior to visit.     Social History  Substance Use Topics  . Smoking status: Former Smoker    Types: Cigarettes    Quit  date: 09/03/1999  . Smokeless tobacco: Never Used  . Alcohol use 0.0 oz/week    Review of Systems  Constitutional: Negative for chills and fever.  HENT: Positive for congestion (mild).   Respiratory: Negative for cough, shortness of breath and wheezing.   Cardiovascular: Negative for chest pain and palpitations.  Gastrointestinal: Negative for nausea and vomiting.  Musculoskeletal: Negative for arthralgias.  Skin: Positive for rash.      Objective:    BP 116/88   Pulse 70   Temp 97.9 F (36.6 C) (Oral)   Wt 132 lb 3.2 oz (60 kg)   SpO2 95%   BMI 24.18 kg/m    Physical Exam  Constitutional: She appears well-developed and well-nourished.  HENT:  Mouth/Throat: No oral lesions.  Eyes: Conjunctivae are normal.  Cardiovascular: Normal rate, regular rhythm, normal heart sounds and normal pulses.     Pulmonary/Chest: Effort normal and breath sounds normal. She has no wheezes. She has no rhonchi. She has no rales.  Musculoskeletal:  No rash on hands.  Neurological: She is alert.  Skin: Skin is warm and dry. Rash noted.  Multiple discrete maculopapular lesions over trunk , <1cm. No vesicles. No draining. Surrounding skin intact.   Psychiatric: She has a normal mood and affect. Her speech is normal and behavior is normal. Thought content normal.  Vitals reviewed.      Assessment & Plan:   Problem List Items Addressed This Visit      Musculoskeletal and Integument   Rash and nonspecific skin eruption    Reassured that rash is resolving. No SOB, dysphagia. Rash onset was 5 days after starting doxycycline however rash is a known, yet rare, side effect of doxycycline. Patient seems to be an allergic person with a severe allergy to shellfish which appeared later in life. I advised her to not take doxycycline and it is listed as a drug allergy for patient. Advised patient to let us know if rash does not completely resolve.       Other Visit Diagnoses   None.       I am having Ms. Hewins maintain her calcium carbonate, primidone, Black Cohosh, cyanocobalamin, Biotin, zoster vaccine live (PF), estradiol, Tdap, levothyroxine, and albuterol.   No orders of the defined types were placed in this encounter.   Return precautions given.   Risks, benefits, and alternatives of the medications and treatment plan prescribed today were discussed, and patient expressed understanding.   Education regarding symptom management and diagnosis given to patient on AVS.  Continue to follow with TULLO, Mar Daring, MD for routine health maintenance.   Vanessa Brown and I agreed with plan.   Rennie Plowman, FNP

## 2016-07-18 NOTE — Progress Notes (Signed)
Pre visit review using our clinic review tool, if applicable. No additional management support is needed unless otherwise documented below in the visit note. 

## 2016-07-18 NOTE — Patient Instructions (Addendum)
Pleasure meeting you.    Let me know if rash does not completely resolved.

## 2016-07-25 DIAGNOSIS — M7051 Other bursitis of knee, right knee: Secondary | ICD-10-CM | POA: Diagnosis not present

## 2016-07-25 DIAGNOSIS — M1711 Unilateral primary osteoarthritis, right knee: Secondary | ICD-10-CM | POA: Diagnosis not present

## 2016-08-05 DIAGNOSIS — Z23 Encounter for immunization: Secondary | ICD-10-CM | POA: Diagnosis not present

## 2016-08-06 ENCOUNTER — Ambulatory Visit: Payer: 59

## 2016-08-13 ENCOUNTER — Ambulatory Visit: Payer: Medicare Other

## 2016-08-13 ENCOUNTER — Other Ambulatory Visit: Payer: Self-pay

## 2016-08-13 DIAGNOSIS — E034 Atrophy of thyroid (acquired): Secondary | ICD-10-CM

## 2016-08-18 ENCOUNTER — Other Ambulatory Visit (INDEPENDENT_AMBULATORY_CARE_PROVIDER_SITE_OTHER): Payer: Medicare Other

## 2016-08-18 DIAGNOSIS — E034 Atrophy of thyroid (acquired): Secondary | ICD-10-CM | POA: Diagnosis not present

## 2016-08-18 LAB — T4, FREE: FREE T4: 1.41 ng/dL (ref 0.60–1.60)

## 2016-08-19 ENCOUNTER — Encounter: Payer: Self-pay | Admitting: Internal Medicine

## 2016-08-22 ENCOUNTER — Encounter: Payer: Self-pay | Admitting: Internal Medicine

## 2016-08-22 ENCOUNTER — Ambulatory Visit (INDEPENDENT_AMBULATORY_CARE_PROVIDER_SITE_OTHER): Payer: Medicare Other | Admitting: Internal Medicine

## 2016-08-22 DIAGNOSIS — R040 Epistaxis: Secondary | ICD-10-CM

## 2016-08-22 DIAGNOSIS — F4321 Adjustment disorder with depressed mood: Secondary | ICD-10-CM

## 2016-08-22 DIAGNOSIS — M85859 Other specified disorders of bone density and structure, unspecified thigh: Secondary | ICD-10-CM

## 2016-08-22 DIAGNOSIS — F4320 Adjustment disorder, unspecified: Secondary | ICD-10-CM

## 2016-08-22 NOTE — Progress Notes (Signed)
Pre-visit discussion using our clinic review tool. No additional management support is needed unless otherwise documented below in the visit note.  

## 2016-08-22 NOTE — Assessment & Plan Note (Signed)
Patient is dealing with the unexpected loss of spouse and has adequate coping skills and emotional support .  i have asked patinet to return in one month to examine for signs of unresolving grief.   

## 2016-08-22 NOTE — Progress Notes (Signed)
Subjective:  Patient ID: Vanessa Brown, female    DOB: 06/25/1941  Age: 75 y.o. MRN: [id number]  CC: Diagnoses of Grief, Osteopenia of thigh, unspecified laterality, and Epistaxis were pertinent to this visit.  HPI Vanessa Leveringnn S Alwine presents for follow up on chronic issues and new onset grief following the loss of her husband in April      Husband of  43 years died in April.  He collapsed at home, was taken to [facility] emergently and died in the cath lab despite having a functioining AICD/pacer    She denies  insomnia.  Not cooking  For self.  Lots of family support and friends who cook for her.  Has lost 9 lbs since last . Drinking almond mik,  Appetite fair, but not eating breakfast.    Has had several episodes of epistaxis at night.  Not profuse  Not occurring weekly. 3 or 4 episodes,  Always occurring on the  the right side .  Has noted some bleeding of gums with brushing with electric toothbrush  (chronic) Last episode  was last week.  She was Treated Sept 21  for sinusitis by Claris CheMargaret with with doxycycline, but developed a rash . Was also given an albuterol MDI  For cough and wheezing which have resolved. .      Lab Results  Component Value Date   TSH 2.05 08/15/2015     Outpatient Medications Prior to Visit  Medication Sig Dispense Refill  . albuterol (PROVENTIL HFA) 108 (90 Base) MCG/ACT inhaler Inhale 2 puffs into the lungs every 6 (six) hours as needed for wheezing or shortness of breath. 1 Inhaler 1  . Biotin 5000 MCG CAPS Take 1 capsule by mouth daily.    . Black Cohosh 540 MG CAPS Take 1 capsule by mouth daily.    . calcium carbonate (OS-CAL) 600 MG TABS Take 600 mg by mouth daily with breakfast. Reported on 12/14/2015    . cyanocobalamin 1000 MCG tablet Take 1,000 mcg by mouth daily.    Marland Kitchen. estradiol (CLIMARA - DOSED IN MG/24 HR) 0.025 mg/24hr patch apply 1 patch every week 4 patch 11  . levothyroxine (SYNTHROID, LEVOTHROID) 50 MCG tablet Take 1 tablet (50 mcg total) by mouth  daily. 90 tablet 2  . primidone (MYSOLINE) 50 MG tablet Take 250 mg by mouth 2 (two) times daily.     . Tdap (BOOSTRIX) 5-2.5-18.5 LF-MCG/0.5 injection Inject 0.5 mLs into the muscle once. 0.5 mL 0  . zoster vaccine live, PF, (ZOSTAVAX) [id number] UNT/0.65ML injection Inject 19,400 Units into the skin once. 1 each 0   No facility-administered medications prior to visit.     Review of Systems;  Patient denies headache, fevers, malaise, unintentional weight loss, skin rash, eye pain, sinus congestion and sinus pain, sore throat, dysphagia,  hemoptysis , cough, dyspnea, wheezing, chest pain, palpitations, orthopnea, edema, abdominal pain, nausea, melena, diarrhea, constipation, flank pain, dysuria, hematuria, urinary  Frequency, nocturia, numbness, tingling, seizures,  Focal weakness, Loss of consciousness,  Tremor, insomnia, depression, anxiety, and suicidal ideation.      Objective:  BP 108/62   Pulse 62   Temp 97.7 F (36.5 C) (Oral)   Resp 12   Ht 5\' 2"  (1.575 m)   Wt 132 lb 4 oz (60 kg)   SpO2 96%   BMI 24.19 kg/m   BP Readings from Last 3 Encounters:  08/22/16 108/62  07/18/16 116/88  07/10/16 138/68    Wt Readings from Last 3 Encounters:  08/22/16 132 lb 4 oz (60 kg)  07/18/16 132 lb 3.2 oz (60 kg)  07/10/16 132 lb 9.6 oz (60.1 kg)    General appearance: alert, cooperative and appears stated age Ears: normal TM's and external ear canals both ears Throat: lips, mucosa, and tongue normal; teeth and gums normal Neck: no adenopathy, no carotid bruit, supple, symmetrical, trachea midline and thyroid not enlarged, symmetric, no tenderness/mass/nodules Back: symmetric, no curvature. ROM normal. No CVA tenderness. Lungs: clear to auscultation bilaterally Heart: regular rate and rhythm, S1, S2 normal, no murmur, click, rub or gallop Abdomen: soft, non-tender; bowel sounds normal; no masses,  no organomegaly Pulses: 2+ and symmetric Skin: Skin color, texture, turgor normal. No  rashes or lesions Lymph nodes: Cervical, supraclavicular, and axillary nodes normal.  No results found for: HGBA1C  Lab Results  Component Value Date   CREATININE 0.82 12/14/2015   CREATININE 0.88 08/15/2015   CREATININE 0.74 01/30/2015    Lab Results  Component Value Date   WBC 6.5 11/22/2014   HGB 12.5 11/22/2014   HCT 37.3 11/22/2014   PLT 361.0 11/22/2014   GLUCOSE 83 12/14/2015   CHOL 206 (H) 12/14/2015   TRIG 65.0 12/14/2015   HDL 79.70 12/14/2015   LDLDIRECT 93.0 12/14/2015   LDLCALC 114 (H) 12/14/2015   ALT 12 12/14/2015   AST 14 12/14/2015   NA 140 12/14/2015   K 4.7 12/14/2015   CL 105 12/14/2015   CREATININE 0.82 12/14/2015   BUN 15 12/14/2015   CO2 28 12/14/2015   TSH 2.05 08/15/2015    Dg Bone Density  Result Date: 01/14/2016 EXAM: DUAL X-RAY ABSORPTIOMETRY (DXA) FOR BONE MINERAL DENSITY IMPRESSION: Dear Dr. Darrick Huntsman, Your patient Vanessa Brown completed a BMD test on 01/14/2016 using the Lunar iDXA DXA System (analysis version: 14.10) manufactured by [employer]. The following summarizes the results of our evaluation. PATIENT BIOGRAPHICAL: Name: Vanessa Brown Patient ID: [id number] Birth Date: 1941-10-03 Height: 61.5 in. Gender: Female Exam Date: 01/14/2016 Weight: 135.8 lbs. Indications: Advanced Age, Caucasian, Height Loss, Hysterectomy, Postmenopausal Fractures: Treatments: Calcium, Estradiol, LEVOTHYROXINE, primidone ASSESSMENT: The BMD measured at Femur Neck Right is 0.838 g/cm2 with a T-score of -1.4. This patient is considered OSTEOPENIC according to World Health Organization [facility]) criteria. Lumbar spine was not utilized due to advanced degenerative changes. Patient was not a candidate for FRAX calculation due to hormone replacement therapy. Site Region Measured Measured WHO Young Adult BMD Date       Age      Classification T-score DualFemur Neck Right 01/14/2016 75.4 Osteopenia -1.4 0.838 g/cm2 DualFemur Neck Right 08/02/2008 68.0 Osteopenia -1.1 0.886 g/cm2 Left  Forearm Radius 33% 01/14/2016 75.4 Normal 0.8 0.949 g/cm2 Left Forearm Radius 33% 08/02/2008 68.0 Normal 0.2 0.897 g/cm2 World Health Organization [facility]) criteria for post-menopausal, Caucasian Women: Normal:       T-score at or above -1 SD Osteopenia:   T-score between -1 and -2.5 SD Osteoporosis: T-score at or below -2.5 SD RECOMMENDATIONS: National Osteoporosis Foundation recommends that FDA-approved medical therapies be considered in postmenopausal women and men age 65 or older with a: 1. Hip or vertebral (clinical or morphometric) fracture. 2. T-score of < -2.5 at the spine or hip. 3. Ten-year fracture probability by FRAX of 3% or greater for hip fracture or 20% or greater for major osteoporotic fracture. All treatment decisions require clinical judgment and consideration of individual patient factors, including patient preferences, co-morbidities, previous drug use, risk factors not captured in the FRAX model (e.g. falls, vitamin D  deficiency, increased bone turnover, interval significant decline in bone density) and possible under - or over-estimation of fracture risk by FRAX. All patients should ensure an adequate intake of dietary calcium (1200 mg/d) and vitamin D (800 IU daily) unless contraindicated. FOLLOW-UP: People with diagnosed cases of osteoporosis or at high risk for fracture should have regular bone mineral density tests. For patients eligible for Medicare, routine testing is allowed once every 2 years. The testing frequency can be increased to one year for patients who have rapidly progressing disease, those who are receiving or discontinuing medical therapy to restore bone mass, or have additional risk factors. I have reviewed this report, and agree with the above findings. Mark A. Tyron RussellBoles, M.D. [facility] Electronically Signed   By: Ulyses SouthwardMark  Boles M.D.   On: 01/14/2016 14:19   Mm Digital Screening Bilateral  Result Date: 01/15/2016 CLINICAL DATA:  Screening. History of bilateral benign  breast biopsies. EXAM: DIGITAL SCREENING BILATERAL MAMMOGRAM WITH CAD COMPARISON:  Previous exam(s). ACR Breast Density Category c: The breast tissue is heterogeneously dense, which may obscure small masses. FINDINGS: Biopsy site markers within each breast are stable in position. There are no findings suspicious for malignancy within either breast. Images were processed with CAD. IMPRESSION: No mammographic evidence of malignancy. A result letter of this screening mammogram will be mailed directly to the patient. RECOMMENDATION: Screening mammogram in one year. (Code:SM-B-01Y) BI-RADS CATEGORY  2: Benign. Electronically Signed   By: Bary RichardStan  Maynard M.D.   On: 01/15/2016 08:21    Assessment & Plan:   Problem List Items Addressed This Visit    Osteopenia    T score in femur now -1.4 by 2017 DEXA (was -1.1 in 2009) .  Taking HRT for menopause syndrome. Discussed the current controversies surrounding the risks and benefits of calcium supplementation.  Encouraged her to increase dietary calcium through natural foods including almond/coconut milk      Epistaxis    Intermittent, mild right side. Exam today is normal. ,  Encouraged use of saline irrigation and vaseline.  If no improvement  ENT evaluation is recommended      Grief    Patient is dealing with the unexpected loss of spouse and has adequate coping skills and emotional support .  i have asked patinet to return in one month to examine for signs of unresolving grief.         A total of 25 minutes of face to face time was spent with patient more than half of which was spent in counselling about the above mentioned conditions  and coordination of care  I am having Ms. Dorothyann GibbsFinch maintain her calcium carbonate, primidone, Black Cohosh, cyanocobalamin, Biotin, zoster vaccine live (PF), estradiol, Tdap, levothyroxine, and albuterol.  No orders of the defined types were placed in this encounter.   There are no discontinued medications.  Follow-up:  Return for FEB 2018 for CPE FAsting labs prior .   Sherlene ShamsULLO, Duwane Gewirtz L, MD

## 2016-08-22 NOTE — Patient Instructions (Addendum)
Don't skip breakfast!  Its the most important meal of the day    I recommend you try a premixed protein drink called Premier Protein shake for breakfast .  It is great tasting,   very low sugar and available of < $2 serving at [employer] and  In bulk for $1.50/serving at [employer] and [employer]  .    Nutritional analysis :  160 cal  30 g protein  1 g sugar 50% calcium needs   [employer] and [employer]   Your thyroid function is normal  on your current levothyroxine mcg daily  dose.  Please continue your current dose and we will repeat in 6 months .  Your blood pressure is normal   Try flushing sinuses with a salt water spray called [employer]'s sinus rinse. Coat the inside of nose with Vaseline.     2000 units of D3 daily should be adequate for the winter months  .     recommend getting the majority of your calcium and Vitamin D  through diet rather than supplements given the recent association of calcium supplements with increased coronary artery calcium scores

## 2016-08-24 DIAGNOSIS — R040 Epistaxis: Secondary | ICD-10-CM | POA: Insufficient documentation

## 2016-08-24 NOTE — Assessment & Plan Note (Signed)
Intermittent, mild right side. Exam today is normal. ,  Encouraged use of saline irrigation and vaseline.  If no improvement  ENT evaluation is recommended

## 2016-08-24 NOTE — Assessment & Plan Note (Signed)
T score in femur now -1.4 by 2017 DEXA (was -1.1 in 2009) .  Taking HRT for menopause syndrome. Discussed the current controversies surrounding the risks and benefits of calcium supplementation.  Encouraged her to increase dietary calcium through natural foods including almond/coconut milk

## 2016-08-25 ENCOUNTER — Ambulatory Visit: Payer: 59 | Admitting: Internal Medicine

## 2016-09-01 ENCOUNTER — Telehealth: Payer: Self-pay | Admitting: Internal Medicine

## 2016-09-01 NOTE — Telephone Encounter (Signed)
Chart updated

## 2016-09-01 NOTE — Telephone Encounter (Signed)
Pt [facility] [place] in October not sure what date. Thank you!

## 2016-09-03 NOTE — Telephone Encounter (Signed)
Mailed unread message to patient.  

## 2016-10-10 ENCOUNTER — Other Ambulatory Visit: Payer: Self-pay | Admitting: Internal Medicine

## 2016-11-07 ENCOUNTER — Other Ambulatory Visit: Payer: Self-pay | Admitting: Internal Medicine

## 2016-11-24 ENCOUNTER — Other Ambulatory Visit: Payer: Self-pay | Admitting: Neurology

## 2016-11-24 DIAGNOSIS — R55 Syncope and collapse: Secondary | ICD-10-CM

## 2016-11-24 DIAGNOSIS — G250 Essential tremor: Secondary | ICD-10-CM | POA: Diagnosis not present

## 2016-12-03 DIAGNOSIS — H2513 Age-related nuclear cataract, bilateral: Secondary | ICD-10-CM | POA: Diagnosis not present

## 2016-12-03 DIAGNOSIS — H018 Other specified inflammations of eyelid: Secondary | ICD-10-CM | POA: Diagnosis not present

## 2016-12-04 ENCOUNTER — Ambulatory Visit: Payer: 59

## 2016-12-04 DIAGNOSIS — R569 Unspecified convulsions: Secondary | ICD-10-CM | POA: Diagnosis not present

## 2016-12-10 ENCOUNTER — Telehealth: Payer: Self-pay | Admitting: Radiology

## 2016-12-10 DIAGNOSIS — E7800 Pure hypercholesterolemia, unspecified: Secondary | ICD-10-CM

## 2016-12-10 DIAGNOSIS — R5383 Other fatigue: Secondary | ICD-10-CM

## 2016-12-10 NOTE — Addendum Note (Signed)
Addended by: Sherlene ShamsULLO, Teriah Muela L on: 12/10/2016 02:51 PM   Modules accepted: Orders

## 2016-12-10 NOTE — Telephone Encounter (Signed)
Fasting labs ordered

## 2016-12-10 NOTE — Telephone Encounter (Signed)
Pt coming for labs Friday, please place future orders. Thank you.

## 2016-12-12 ENCOUNTER — Other Ambulatory Visit: Payer: Self-pay | Admitting: Internal Medicine

## 2016-12-12 ENCOUNTER — Other Ambulatory Visit (INDEPENDENT_AMBULATORY_CARE_PROVIDER_SITE_OTHER): Payer: Medicare Other

## 2016-12-12 DIAGNOSIS — E7800 Pure hypercholesterolemia, unspecified: Secondary | ICD-10-CM

## 2016-12-12 DIAGNOSIS — R5383 Other fatigue: Secondary | ICD-10-CM | POA: Diagnosis not present

## 2016-12-12 DIAGNOSIS — Z1231 Encounter for screening mammogram for malignant neoplasm of breast: Secondary | ICD-10-CM

## 2016-12-12 LAB — LIPID PANEL
CHOL/HDL RATIO: 3
Cholesterol: 254 mg/dL — ABNORMAL HIGH (ref 0–200)
HDL: 81.2 mg/dL (ref >39.00)
LDL Cholesterol: 158 mg/dL — ABNORMAL HIGH (ref 0–99)
NONHDL: 172.58
Triglycerides: 72 mg/dL (ref 0.0–149.0)
VLDL: 14.4 mg/dL (ref 0.0–40.0)

## 2016-12-12 LAB — CBC WITH DIFFERENTIAL/PLATELET
BASOS ABS: 0 10*3/uL (ref 0.0–0.1)
Basophils Relative: 0.4 % (ref 0.0–3.0)
EOS PCT: 9.7 % — AB (ref 0.0–5.0)
Eosinophils Absolute: 0.9 10*3/uL — ABNORMAL HIGH (ref 0.0–0.7)
HEMATOCRIT: 36.6 % (ref 36.0–46.0)
Hemoglobin: 12.2 g/dL (ref 12.0–15.0)
LYMPHS PCT: 32.3 % (ref 12.0–46.0)
Lymphs Abs: 3 10*3/uL (ref 0.7–4.0)
MCHC: 33.2 g/dL (ref 30.0–36.0)
MCV: 102.2 fl — AB (ref 78.0–100.0)
MONOS PCT: 8 % (ref 3.0–12.0)
Monocytes Absolute: 0.7 10*3/uL (ref 0.1–1.0)
NEUTROS ABS: 4.6 10*3/uL (ref 1.4–7.7)
Neutrophils Relative %: 49.6 % (ref 43.0–77.0)
Platelets: 363 10*3/uL (ref 150.0–400.0)
RBC: 3.58 Mil/uL — AB (ref 3.87–5.11)
RDW: 14.9 % (ref 11.5–15.5)
WBC: 9.3 10*3/uL (ref 4.0–10.5)

## 2016-12-12 LAB — TSH: TSH: 3.05 u[IU]/mL (ref 0.35–4.50)

## 2016-12-12 LAB — COMPREHENSIVE METABOLIC PANEL
ALT: 28 U/L (ref 0–35)
AST: 22 U/L (ref 0–37)
Albumin: 4.1 g/dL (ref 3.5–5.2)
Alkaline Phosphatase: 113 U/L (ref 39–117)
BUN: 19 mg/dL (ref 6–23)
CHLORIDE: 105 meq/L (ref 96–112)
CO2: 30 mEq/L (ref 19–32)
Calcium: 9.6 mg/dL (ref 8.4–10.5)
Creatinine, Ser: 0.77 mg/dL (ref 0.40–1.20)
GFR: 77.39 mL/min (ref >60.00)
GLUCOSE: 84 mg/dL (ref 70–99)
POTASSIUM: 5 meq/L (ref 3.5–5.1)
SODIUM: 140 meq/L (ref 135–145)
TOTAL PROTEIN: 6.9 g/dL (ref 6.0–8.3)
Total Bilirubin: 0.3 mg/dL (ref 0.2–1.2)

## 2016-12-15 ENCOUNTER — Ambulatory Visit: Payer: Medicare Other

## 2016-12-15 ENCOUNTER — Ambulatory Visit (INDEPENDENT_AMBULATORY_CARE_PROVIDER_SITE_OTHER): Payer: Medicare Other

## 2016-12-15 ENCOUNTER — Encounter: Payer: Self-pay | Admitting: Internal Medicine

## 2016-12-15 ENCOUNTER — Ambulatory Visit (INDEPENDENT_AMBULATORY_CARE_PROVIDER_SITE_OTHER): Payer: Medicare Other | Admitting: Internal Medicine

## 2016-12-15 VITALS — BP 118/64 | HR 76 | Temp 98.0°F | Resp 14 | Ht 62.0 in | Wt 133.0 lb

## 2016-12-15 DIAGNOSIS — G250 Essential tremor: Secondary | ICD-10-CM

## 2016-12-15 DIAGNOSIS — E039 Hypothyroidism, unspecified: Secondary | ICD-10-CM

## 2016-12-15 DIAGNOSIS — Z0000 Encounter for general adult medical examination without abnormal findings: Secondary | ICD-10-CM | POA: Diagnosis not present

## 2016-12-15 DIAGNOSIS — E785 Hyperlipidemia, unspecified: Secondary | ICD-10-CM | POA: Diagnosis not present

## 2016-12-15 DIAGNOSIS — R030 Elevated blood-pressure reading, without diagnosis of hypertension: Secondary | ICD-10-CM

## 2016-12-15 DIAGNOSIS — N951 Menopausal and female climacteric states: Secondary | ICD-10-CM

## 2016-12-15 MED ORDER — TETANUS-DIPHTH-ACELL PERTUSSIS 5-2.5-18.5 LF-MCG/0.5 IM SUSP: 0.5000 mL | Freq: Once | INTRAMUSCULAR | 0 refills | Status: AC

## 2016-12-15 NOTE — Progress Notes (Signed)
Subjective:   Vanessa Brown is a 76 y.o. female who presents for Medicare Annual (Subsequent) preventive examination.  Review of Systems:  No ROS.  Medicare Wellness Visit.  Cardiac Risk Factors include: advanced age (>4men, >26 women)     Objective:     Vitals: BP 118/64 (BP Location: Left Arm, Patient Position: Sitting, Cuff Size: Normal)   Pulse 76   Temp 98 F (36.7 C) (Oral)   Resp 14   Ht 5\' 2"  (1.575 m)   Wt 133 lb (60.3 kg)   SpO2 95%   BMI 24.33 kg/m   Body mass index is 24.33 kg/m.   Tobacco History  Smoking Status  . Former Smoker  . Types: Cigarettes  . Quit date: 09/03/1999  Smokeless Tobacco  . Never Used     Counseling given: Not Answered   Past Medical History:  Diagnosis Date  . Anxiety   . Arthritis    spine, prior Pain Clinic patient  . Cervical back pain with evidence of disc disease 2007   prior epidural steorid injection s  . Duodenal ulcer   . Gastric ulcer    H Pylori positive  . GERD (gastroesophageal reflux disease)   . Irritable bowel syndrome (IBS)    Dorena Cookey, [facility]  . Neuropathy (HCC)    "toilet seat", Sherryll Burger), caused  by sitting on commode  . Screening for breast cancer   . Screening for cervical cancer   . Tremor, essential    treated with primidone 25 mg qhs   Past Surgical History:  Procedure Laterality Date  . ABDOMINAL HYSTERECTOMY  1967  . BIOPSY BREAST     benign,. right breast ([place])  . BREAST BIOPSY Bilateral    Neg- core  . BREAST SURGERY     [place], benign biopsies  . ROTATOR CUFF REPAIR  2004   right shoulder , [place]   Family History  Problem Relation Age of Onset  . Diabetes Mother   . Parkinson's disease Father   . Dementia Father   . Diabetes Son   . Breast cancer Neg Hx    History  Sexual Activity  . Sexual activity: No    Outpatient Encounter Prescriptions as of 12/15/2016  Medication Sig  . albuterol (PROVENTIL HFA) 108 (90 Base) MCG/ACT inhaler Inhale 2 puffs into the  lungs every 6 (six) hours as needed for wheezing or shortness of breath. (Patient not taking: Reported on 12/15/2016)  . Biotin 5000 MCG CAPS Take 1 capsule by mouth daily.  . Black Cohosh 540 MG CAPS Take 1 capsule by mouth daily.  . calcium carbonate (OS-CAL) 600 MG TABS Take 600 mg by mouth daily with breakfast. Reported on 12/14/2015  . cyanocobalamin 1000 MCG tablet Take 1,000 mcg by mouth daily.  Marland Kitchen estradiol (CLIMARA - DOSED IN MG/24 HR) 0.025 mg/24hr patch apply 1 patch every week  . levothyroxine (SYNTHROID, LEVOTHROID) 50 MCG tablet take 1 tablet by mouth once daily  . primidone (MYSOLINE) 50 MG tablet Take 250 mg by mouth 2 (two) times daily.    No facility-administered encounter medications on file as of 12/15/2016.     Activities of Daily Living In your present state of health, do you have any difficulty performing the following activities: 12/15/2016  Hearing? N  Vision? N  Difficulty concentrating or making decisions? Y  Walking or climbing stairs? N  Dressing or bathing? N  Doing errands, shopping? N  Preparing Food and eating ? N  Using the  Toilet? N  In the past six months, have you accidently leaked urine? N  Do you have problems with loss of bowel control? N  Managing your Medications? N  Managing your Finances? N  Housekeeping or managing your Housekeeping? N  Some recent data might be hidden    Patient Care Team: Sherlene Shamseresa L Tullo, MD as PCP - General (Internal Medicine)    Assessment:    This is a routine wellness examination for Vanessa Brown. The goal of the wellness visit is to assist the patient how to close the gaps in care and create a preventative care plan for the patient.   Osteoporosis risk reviewed.  Medications reviewed; taking without issues or barriers.  Safety issues reviewed; lives alone. Smoke detectors in the home. No firearms in the home. Wears seatbelts when driving or riding with others. No violence in the home.  No identified risk were  noted; The patient was oriented x 3; appropriate in dress and manner and no objective failures at ADL's or IADL's.   BMI; discussed the importance of a healthy diet, water intake and exercise. Educational material provided.  Sleep pattern:  Sleeps 7-8 hrs a night, wakes up feeling rested.  Patient Concerns: None at this time. Follow up with PCP as needed.  Exercise Activities and Dietary recommendations Current Exercise Habits: Home exercise routine, Type of exercise: walking (Yard work, dancing, golf ), Time (Minutes): 30, Frequency (Times/Week): 5, Weekly Exercise (Minutes/Week): 150, Intensity: Moderate  Goals    . Healthy Lifestyle          Stay hydrated  Stay active Low carb foods      Fall Risk Fall Risk  12/15/2016 12/14/2015 11/22/2014 11/19/2012  Falls in the past year? Yes No No No  Number falls in past yr: 1 - - -  Injury with Fall? No - - -   Depression Screen PHQ 2/9 Scores 12/15/2016 12/14/2015 11/22/2014 11/19/2012  PHQ - 2 Score 0 0 0 0     Cognitive Function     6CIT Screen 12/15/2016  What Year? 0 points  What month? 0 points  What time? 0 points  Count back from 20 0 points  Months in reverse 0 points  Repeat phrase 0 points  Total Score 0    Immunization History  Administered Date(s) Administered  . Influenza Split 08/25/2011, 07/24/2014  . Influenza-Unspecified 08/03/2012, 07/25/2015, 08/05/2016  . Pneumococcal Conjugate-13 11/22/2014  . Pneumococcal Polysaccharide-23 11/21/2006   Screening Tests Health Maintenance  Topic Date Due  . TETANUS/TDAP  07/29/1959  . INFLUENZA VACCINE  Completed  . DEXA SCAN  Completed  . PNA vac Low Risk Adult  Completed      Plan:    End of life planning; Advance aging; Advanced directives discussed. Copy of current HCPOA/Living Will requested.  Medicare Attestation I have personally reviewed: The patient's medical and social history Their use of alcohol, tobacco or illicit drugs Their current medications  and supplements The patient's functional ability including ADLs,fall risks, home safety risks, cognitive, and hearing and visual impairment Diet and physical activities Evidence for depression   The patient's weight, height, BMI, and visual acuity have been recorded in the chart.  I have made referrals and provided education to the patient based on review of the above and I have provided the patient with a written personalized care plan for preventive services.    During the course of the visit the patient was educated and counseled about the following appropriate screening and preventive services:  Vaccines to include Pneumoccal, Influenza, Hepatitis B, Td, Zostavax, HCV  Cardiovascular Disease  Colorectal cancer   Bone density   Diabetes   Glaucoma   Mammography  Nutrition counseling   Patient Instructions (the written plan) was given to the patient.   Ashok Pall, LPN  6/96/2952

## 2016-12-15 NOTE — Progress Notes (Signed)
Pre visit review using our clinic review tool, if applicable. No additional management support is needed unless otherwise documented below in the visit note. 

## 2016-12-15 NOTE — Patient Instructions (Signed)
The new goals for optimal blood pressure management are 120/70.  Please check your blood pressure a few times at home and send me the readings so I can determine if you need a change in medication.  Your still need your tetanus-diptheria-pertussis vaccine (TDaP) but you can get it for less $$$ at a local pharmacy with the script I have provided you.    Health Maintenance for Postmenopausal Women Introduction Menopause is a normal process in which your reproductive ability comes to an end. This process happens gradually over a span of months to years, usually between the ages of 10 and 70. Menopause is complete when you have missed 12 consecutive menstrual periods. It is important to talk with your health care provider about some of the most common conditions that affect postmenopausal women, such as heart disease, cancer, and bone loss (osteoporosis). Adopting a healthy lifestyle and getting preventive care can help to promote your health and wellness. Those actions can also lower your chances of developing some of these common conditions. What should I know about menopause? During menopause, you may experience a number of symptoms, such as:  Moderate-to-severe hot flashes.  Night sweats.  Decrease in sex drive.  Mood swings.  Headaches.  Tiredness.  Irritability.  Memory problems.  Insomnia. Choosing to treat or not to treat menopausal changes is an individual decision that you make with your health care provider. What should I know about hormone replacement therapy and supplements? Hormone therapy products are effective for treating symptoms that are associated with menopause, such as hot flashes and night sweats. Hormone replacement carries certain risks, especially as you become older. If you are thinking about using estrogen or estrogen with progestin treatments, discuss the benefits and risks with your health care provider. What should I know about heart disease and  stroke? Heart disease, heart attack, and stroke become more likely as you age. This may be due, in part, to the hormonal changes that your body experiences during menopause. These can affect how your body processes dietary fats, triglycerides, and cholesterol. Heart attack and stroke are both medical emergencies. There are many things that you can do to help prevent heart disease and stroke:  Have your blood pressure checked at least every 1-2 years. High blood pressure causes heart disease and increases the risk of stroke.  If you are 9-10 years old, ask your health care provider if you should take aspirin to prevent a heart attack or a stroke.  Do not use any tobacco products, including cigarettes, chewing tobacco, or electronic cigarettes. If you need help quitting, ask your health care provider.  It is important to eat a healthy diet and maintain a healthy weight.  Be sure to include plenty of vegetables, fruits, low-fat dairy products, and lean protein.  Avoid eating foods that are high in solid fats, added sugars, or salt (sodium).  Get regular exercise. This is one of the most important things that you can do for your health.  Try to exercise for at least 150 minutes each week. The type of exercise that you do should increase your heart rate and make you sweat. This is known as moderate-intensity exercise.  Try to do strengthening exercises at least twice each week. Do these in addition to the moderate-intensity exercise.  Know your numbers.Ask your health care provider to check your cholesterol and your blood glucose. Continue to have your blood tested as directed by your health care provider. What should I know about cancer screening? There  are several types of cancer. Take the following steps to reduce your risk and to catch any cancer development as early as possible. Breast Cancer  Practice breast self-awareness.  This means understanding how your breasts normally appear  and feel.  It also means doing regular breast self-exams. Let your health care provider know about any changes, no matter how small.  If you are 40 or older, have a clinician do a breast exam (clinical breast exam or CBE) every year. Depending on your age, family history, and medical history, it may be recommended that you also have a yearly breast X-ray (mammogram).  If you have a family history of breast cancer, talk with your health care provider about genetic screening.  If you are at high risk for breast cancer, talk with your health care provider about having an MRI and a mammogram every year.  Breast cancer (BRCA) gene test is recommended for women who have family members with BRCA-related cancers. Results of the assessment will determine the need for genetic counseling and BRCA1 and for BRCA2 testing. BRCA-related cancers include these types:  Breast. This occurs in males or females.  Ovarian.  Tubal. This may also be called fallopian tube cancer.  Cancer of the abdominal or pelvic lining (peritoneal cancer).  Prostate.  Pancreatic. Cervical, Uterine, and Ovarian Cancer  Your health care provider may recommend that you be screened regularly for cancer of the pelvic organs. These include your ovaries, uterus, and vagina. This screening involves a pelvic exam, which includes checking for microscopic changes to the surface of your cervix (Pap test).  For women ages 21-65, health care providers may recommend a pelvic exam and a Pap test every three years. For women ages 4-65, they may recommend the Pap test and pelvic exam, combined with testing for human papilloma virus (HPV), every five years. Some types of HPV increase your risk of cervical cancer. Testing for HPV may also be done on women of any age who have unclear Pap test results.  Other health care providers may not recommend any screening for nonpregnant women who are considered low risk for pelvic cancer and have no  symptoms. Ask your health care provider if a screening pelvic exam is right for you.  If you have had past treatment for cervical cancer or a condition that could lead to cancer, you need Pap tests and screening for cancer for at least 20 years after your treatment. If Pap tests have been discontinued for you, your risk factors (such as having a new sexual partner) need to be reassessed to determine if you should start having screenings again. Some women have medical problems that increase the chance of getting cervical cancer. In these cases, your health care provider may recommend that you have screening and Pap tests more often.  If you have a family history of uterine cancer or ovarian cancer, talk with your health care provider about genetic screening.  If you have vaginal bleeding after reaching menopause, tell your health care provider.  There are currently no reliable tests available to screen for ovarian cancer. Lung Cancer  Lung cancer screening is recommended for adults 68-14 years old who are at high risk for lung cancer because of a history of smoking. A yearly low-dose CT scan of the lungs is recommended if you:  Currently smoke.  Have a history of at least 30 pack-years of smoking and you currently smoke or have quit within the past 15 years. A pack-year is smoking an average  of one pack of cigarettes per day for one year. Yearly screening should:  Continue until it has been 15 years since you quit.  Stop if you develop a health problem that would prevent you from having lung cancer treatment. Colorectal Cancer  This type of cancer can be detected and can often be prevented.  Routine colorectal cancer screening usually begins at age 76 and continues through age 35.  If you have risk factors for colon cancer, your health care provider may recommend that you be screened at an earlier age.  If you have a family history of colorectal cancer, talk with your health care provider  about genetic screening.  Your health care provider may also recommend using home test kits to check for hidden blood in your stool.  A small camera at the end of a tube can be used to examine your colon directly (sigmoidoscopy or colonoscopy). This is done to check for the earliest forms of colorectal cancer.  Direct examination of the colon should be repeated every 5-10 years until age 23. However, if early forms of precancerous polyps or small growths are found or if you have a family history or genetic risk for colorectal cancer, you may need to be screened more often. Skin Cancer  Check your skin from head to toe regularly.  Monitor any moles. Be sure to tell your health care provider:  About any new moles or changes in moles, especially if there is a change in a mole's shape or color.  If you have a mole that is larger than the size of a pencil eraser.  If any of your family members has a history of skin cancer, especially at a young age, talk with your health care provider about genetic screening.  Always use sunscreen. Apply sunscreen liberally and repeatedly throughout the day.  Whenever you are outside, protect yourself by wearing long sleeves, pants, a wide-brimmed hat, and sunglasses. What should I know about osteoporosis? Osteoporosis is a condition in which bone destruction happens more quickly than new bone creation. After menopause, you may be at an increased risk for osteoporosis. To help prevent osteoporosis or the bone fractures that can happen because of osteoporosis, the following is recommended:  If you are 25-50 years old, get at least 1,000 mg of calcium and at least 600 mg of vitamin D per day.  If you are older than age 74 but younger than age 65, get at least 1,200 mg of calcium and at least 600 mg of vitamin D per day.  If you are older than age 29, get at least 1,200 mg of calcium and at least 800 mg of vitamin D per day. Smoking and excessive alcohol intake  increase the risk of osteoporosis. Eat foods that are rich in calcium and vitamin D, and do weight-bearing exercises several times each week as directed by your health care provider. What should I know about how menopause affects my mental health? Depression may occur at any age, but it is more common as you become older. Common symptoms of depression include:  Low or sad mood.  Changes in sleep patterns.  Changes in appetite or eating patterns.  Feeling an overall lack of motivation or enjoyment of activities that you previously enjoyed.  Frequent crying spells. Talk with your health care provider if you think that you are experiencing depression. What should I know about immunizations? It is important that you get and maintain your immunizations. These include:  Tetanus, diphtheria, and pertussis (  Tdap) booster vaccine.  Influenza every year before the flu season begins.  Pneumonia vaccine.  Shingles vaccine. Your health care provider may also recommend other immunizations. This information is not intended to replace advice given to you by your health care provider. Make sure you discuss any questions you have with your health care provider. Document Released: 11/28/2005 Document Revised: 04/25/2016 Document Reviewed: 07/10/2015  2017 [employer]

## 2016-12-15 NOTE — Patient Instructions (Addendum)
  Ms. Vanessa Brown , Thank you for taking time to come for your Medicare Wellness Visit. I appreciate your ongoing commitment to your health goals. Please review the following plan we discussed and let me know if I can assist you in the future.   Follow up with Dr. Darrick Huntsmanullo as needed.  Bring copy of Health Care Power of Attorney and/or Living Will to be scanned into chart.  These are the goals we discussed: Goals    . Healthy Lifestyle          Stay hydrated  Stay active Low carb foods       This is a list of the screening recommended for you and due dates:  Health Maintenance  Topic Date Due  . Tetanus Vaccine  07/29/1959  . Flu Shot  Completed  . DEXA scan (bone density measurement)  Completed  . Pneumonia vaccines  Completed

## 2016-12-15 NOTE — Progress Notes (Signed)
Patient ID: Vanessa Brown, female    DOB: 06/05/1941  Age: [age] y.o. MRN: [id number]  The patient is here for examination and management of other chronic and acute problems.   The risk factors are reflected in the social history.  The roster of all physicians providing medical care to patient - is listed in the Snapshot section of the chart.  Activities of daily living:  The patient is 100% independent in all ADLs: dressing, toileting, feeding as well as independent mobility  Home safety : The patient has smoke detectors in the home. They wear seatbelts.  There are no firearms at home. There is no violence in the home.   There is no risks for hepatitis, STDs or HIV. There is no   history of blood transfusion. She has  no travel history to infectious disease endemic areas of the world.  The patient has seen their dentist in the last six month. They have seen their eye doctor in the last year.   They do not  have excessive sun exposure. Discussed the need for sun protection: hats, long sleeves and use of sunscreen if there is significant sun exposure.   Diet: the importance of a healthy diet is discussed. They do have a healthy diet.  The benefits of regular aerobic exercise were discussed. She walks 4 times per week ,  20 minutes.   Depression screen: there are no signs or vegative symptoms of depression- irritability, change in appetite, anhedonia, sadness/tearfullness.  Cognitive assessment: the patient manages all their financial and personal affairs and is actively engaged. They could relate day,date,year and events; recalled 2/3 objects at 3 minutes; performed clock-face test normally.  The following portions of the patient's history were reviewed and updated as appropriate: allergies, current medications, past family history, past medical history,  past surgical history, past social history  and problem list.  Visual acuity was not assessed per patient preference since she has regular  follow up with her ophthalmologist. Hearing and body mass index were assessed and reviewed.   During the course of the visit the patient was educated and counseled about appropriate screening and preventive services including : fall prevention , diabetes screening, nutrition counseling, colorectal cancer screening, and recommended immunizations.    CC: Diagnoses of Hyperlipidemia LDL goal <130, Tremor, essential, Acquired hypothyroidism, Elevated blood pressure reading in office without diagnosis of hypertension, and Menopause syndrome were pertinent to this visit.  Grief:  Last seen in November after husband passed away.  She is feeling better,  Spent the holidays with her children and their families.  Had excellent emotional support  Her bilateral hand tremor is becoming more pronounced,  And see is seeing local neurology.  Dr. Sherryll BurgerShah did an EEG ,  Results  Pending , and a brain MRI has been ordered but not done yet.  She is taking Primidone. Her symptoms are aggravated by caffeine.  still can use a fork and spoon   Depression screen negative.   discussed bp elevation  Hist ory Vanessa Hatchnn has a past medical history of Anxiety; Arthritis; Cervical back pain with evidence of disc disease (2007); Duodenal ulcer; Gastric ulcer; GERD (gastroesophageal reflux disease); Irritable bowel syndrome (IBS); Neuropathy (HCC); Screening for breast cancer; Screening for cervical cancer; and Tremor, essential.   She has a past surgical history that includes Rotator cuff repair (2004); Biopsy breast; Abdominal hysterectomy (1967); Breast surgery; and Breast biopsy (Bilateral).   Her family history includes Dementia in her father; Diabetes in her mother  and son; Parkinson's disease in her father.She reports that she quit smoking about 17 years ago. Her smoking use included Cigarettes. She has never used smokeless tobacco. She reports that she drinks alcohol. She reports that she does not use drugs.  Outpatient  Medications Prior to Visit  Medication Sig Dispense Refill  . Biotin 5000 MCG CAPS Take 1 capsule by mouth daily.    . Black Cohosh 540 MG CAPS Take 1 capsule by mouth daily.    . calcium carbonate (OS-CAL) 600 MG TABS Take 600 mg by mouth daily with breakfast. Reported on 12/14/2015    . cyanocobalamin 1000 MCG tablet Take 1,000 mcg by mouth daily.    Marland Kitchen estradiol (CLIMARA - DOSED IN MG/24 HR) 0.025 mg/24hr patch apply 1 patch every week 4 patch 11  . levothyroxine (SYNTHROID, LEVOTHROID) 50 MCG tablet take 1 tablet by mouth once daily 90 tablet 0  . primidone (MYSOLINE) 50 MG tablet Take 250 mg by mouth 2 (two) times daily.     . Tdap (BOOSTRIX) 5-2.5-18.5 LF-MCG/0.5 injection Inject 0.5 mLs into the muscle once. 0.5 mL 0  . zoster vaccine live, PF, (ZOSTAVAX) 16109 UNT/0.65ML injection Inject 19,400 Units into the skin once. 1 each 0  . albuterol (PROVENTIL HFA) 108 (90 Base) MCG/ACT inhaler Inhale 2 puffs into the lungs every 6 (six) hours as needed for wheezing or shortness of breath. (Patient not taking: Reported on 12/15/2016) 1 Inhaler 1   No facility-administered medications prior to visit.     Review of Systems   Patient denies headache, fevers, malaise, unintentional weight loss, skin rash, eye pain, sinus congestion and sinus pain, sore throat, dysphagia,  hemoptysis , cough, dyspnea, wheezing, chest pain, palpitations, orthopnea, edema, abdominal pain, nausea, melena, diarrhea, constipation, flank pain, dysuria, hematuria, urinary  Frequency, nocturia, numbness, tingling, seizures,  Focal weakness, Loss of consciousness,  insomnia, depression, anxiety, and suicidal ideation.      Objective:  BP 118/64   Pulse 76   Resp 16   Ht 5\' 2"  (1.575 m)   Wt 133 lb (60.3 kg)   SpO2 95%   BMI 24.33 kg/m   Physical Exam   General appearance: alert, cooperative and appears stated age Head: Normocephalic, without obvious abnormality, atraumatic Eyes: conjunctivae/corneas clear. PERRL,  EOM's intact. Fundi benign. Ears: normal TM's and external ear canals both ears Nose: Nares normal. Septum midline. Mucosa normal. No drainage or sinus tenderness. Throat: lips, mucosa, and tongue normal; teeth and gums normal Neck: no adenopathy, no carotid bruit, no JVD, supple, symmetrical, trachea midline and thyroid not enlarged, symmetric, no tenderness/mass/nodules Lungs: clear to auscultation bilaterally Breasts: normal appearance, no masses or tenderness Heart: regular rate and rhythm, S1, S2 normal, no murmur, click, rub or gallop Abdomen: soft, non-tender; bowel sounds normal; no masses,  no organomegaly Extremities: extremities normal, atraumatic, no cyanosis or edema Pulses: 2+ and symmetric Skin: Skin color, texture, turgor normal. No rashes or lesions Neurologic: Alert and oriented X 3, normal strength and tone. Normal symmetric reflexes. Normal coordination and gait.      Assessment & Plan:   Problem List Items Addressed This Visit    Elevated blood pressure reading in office without diagnosis of hypertension    sHe has no prior history of hypertension. He will check his blood pressure several times over the next 3-4 weeks and to submit readings for evaluation. Renal function and Urine microalbumin to creatinine ratio done today is normal.  Lab Results  Component Value Date  CREATININE 0.77 12/12/2016   No results found for: MICROALBUR, [id number]       Hyperlipidemia LDL goal <130    She did not tolerate low dose crestor.  continue red yeast rice.   Lab Results  Component Value Date   CHOL 254 (H) 12/12/2016   HDL 81.20 12/12/2016   LDLCALC 158 (H) 12/12/2016   LDLDIRECT 93.0 12/14/2015   TRIG 72.0 12/12/2016   CHOLHDL 3 12/12/2016   Lab Results  Component Value Date   ALT 28 12/12/2016   AST 22 12/12/2016   ALKPHOS 113 12/12/2016   BILITOT 0.3 12/12/2016         Hypothyroid    Thyroid function is WNL on current dose.  No current changes needed.    Lab Results  Component Value Date   TSH 3.05 12/12/2016         Menopause syndrome    Managed with climara due to severe sleep disruption without estrogen.       Tremor, essential    Worsening per patient,  Taking primodone and seeing neurology with eeg and MRi ordered .  She remains IADL and has no balance issues          I have discontinued Ms. Angelica's zoster vaccine live (PF) and Tdap. I am also having her start on Tdap and Red Yeast Rice Extract. Additionally, I am having her maintain her calcium carbonate, primidone, Black Cohosh, cyanocobalamin, Biotin, albuterol, estradiol, and levothyroxine.  Meds ordered this encounter  Medications  . Tdap (BOOSTRIX) 5-2.5-18.5 LF-MCG/0.5 injection    Sig: Inject 0.5 mLs into the muscle once.    Dispense:  0.5 mL    Refill:  0  . Red Yeast Rice Extract 600 MG TABS    Sig: Take 1 tablet (600 mg total) by mouth 2 (two) times daily.    Medications Discontinued During This Encounter  Medication Reason  . Tdap (BOOSTRIX) 5-2.5-18.5 LF-MCG/0.5 injection Patient has not taken in last 30 days  . zoster vaccine live, PF, (ZOSTAVAX) 11914 UNT/0.65ML injection Patient has not taken in last 30 days   A total of 40 minutes was spent with patient more than half of which was spent in counseling patient on the above mentioned issues , reviewing and explaining recent labs and imaging studies done, and coordination of care.   Follow-up: No Follow-up on file.   Sherlene Shams, MD

## 2016-12-16 DIAGNOSIS — E039 Hypothyroidism, unspecified: Secondary | ICD-10-CM | POA: Insufficient documentation

## 2016-12-16 DIAGNOSIS — R030 Elevated blood-pressure reading, without diagnosis of hypertension: Secondary | ICD-10-CM | POA: Insufficient documentation

## 2016-12-16 MED ORDER — RED YEAST RICE EXTRACT 600 MG PO TABS: 1.0000 | ORAL_TABLET | Freq: Two times a day (BID) | ORAL | Status: DC

## 2016-12-16 NOTE — Assessment & Plan Note (Signed)
Worsening per patient,  Taking primodone and seeing neurology with eeg and MRi ordered .  She remains IADL and has no balance issues

## 2016-12-16 NOTE — Assessment & Plan Note (Signed)
sHe has no prior history of hypertension. He will check his blood pressure several times over the next 3-4 weeks and to submit readings for evaluation. Renal function and Urine microalbumin to creatinine ratio done today is normal.  Lab Results  Component Value Date   CREATININE 0.77 12/12/2016   No results found for: Concepcion ElkMICROALBUR, [id number]

## 2016-12-16 NOTE — Assessment & Plan Note (Signed)
Managed with climara due to severe sleep disruption without estrogen.

## 2016-12-16 NOTE — Assessment & Plan Note (Signed)
Thyroid function is WNL on current dose.  No current changes needed.   Lab Results  Component Value Date   TSH 3.05 12/12/2016

## 2016-12-16 NOTE — Assessment & Plan Note (Signed)
She did not tolerate low dose crestor.  continue red yeast rice.   Lab Results  Component Value Date   CHOL 254 (H) 12/12/2016   HDL 81.20 12/12/2016   LDLCALC 158 (H) 12/12/2016   LDLDIRECT 93.0 12/14/2015   TRIG 72.0 12/12/2016   CHOLHDL 3 12/12/2016   Lab Results  Component Value Date   ALT 28 12/12/2016   AST 22 12/12/2016   ALKPHOS 113 12/12/2016   BILITOT 0.3 12/12/2016

## 2016-12-17 NOTE — Progress Notes (Signed)
  I have reviewed the above information and agree with above.   Bader Stubblefield, MD 

## 2016-12-19 ENCOUNTER — Ambulatory Visit
Admission: RE | Admit: 2016-12-19 | Discharge: 2016-12-19 | Disposition: A | Discharge status: Home / Self Care | Payer: Medicare Other | Source: Ambulatory Visit | Attending: Neurology | Admitting: Neurology

## 2016-12-19 DIAGNOSIS — R55 Syncope and collapse: Secondary | ICD-10-CM | POA: Diagnosis not present

## 2016-12-22 ENCOUNTER — Encounter: Payer: 59 | Admitting: Internal Medicine

## 2017-01-14 ENCOUNTER — Ambulatory Visit
Admission: RE | Admit: 2017-01-14 | Discharge: 2017-01-14 | Disposition: A | Discharge status: Home / Self Care | Payer: Medicare Other | Source: Ambulatory Visit | Attending: Internal Medicine | Admitting: Internal Medicine

## 2017-01-14 DIAGNOSIS — Z1231 Encounter for screening mammogram for malignant neoplasm of breast: Secondary | ICD-10-CM | POA: Diagnosis not present

## 2017-01-22 ENCOUNTER — Telehealth: Payer: Self-pay | Admitting: Internal Medicine

## 2017-01-22 NOTE — Telephone Encounter (Signed)
Please advise 

## 2017-01-22 NOTE — Telephone Encounter (Signed)
Spoke with pt and informed her of the mammogram results. Pt gave a verbal understanding.

## 2017-01-22 NOTE — Telephone Encounter (Signed)
Pt called requesting mammogram results. Please advise, thank you!  Call pt @ [phone] or [phone]

## 2017-01-22 NOTE — Telephone Encounter (Signed)
Vanessa Brown will mail her a letter informing her of the results of her mammogram. But it is normal

## 2017-01-28 DIAGNOSIS — G250 Essential tremor: Secondary | ICD-10-CM | POA: Diagnosis not present

## 2017-02-04 ENCOUNTER — Other Ambulatory Visit: Payer: Self-pay | Admitting: Internal Medicine

## 2017-03-04 DIAGNOSIS — M5416 Radiculopathy, lumbar region: Secondary | ICD-10-CM | POA: Diagnosis not present

## 2017-03-04 DIAGNOSIS — M5136 Other intervertebral disc degeneration, lumbar region: Secondary | ICD-10-CM | POA: Diagnosis not present

## 2017-03-18 DIAGNOSIS — L309 Dermatitis, unspecified: Secondary | ICD-10-CM | POA: Diagnosis not present

## 2017-04-10 DIAGNOSIS — M5136 Other intervertebral disc degeneration, lumbar region: Secondary | ICD-10-CM | POA: Diagnosis not present

## 2017-04-10 DIAGNOSIS — M5416 Radiculopathy, lumbar region: Secondary | ICD-10-CM | POA: Diagnosis not present

## 2017-05-01 ENCOUNTER — Telehealth: Payer: Self-pay | Admitting: Internal Medicine

## 2017-05-01 NOTE — Telephone Encounter (Signed)
Pt called and wanted to know if Dr. Darrick Huntsmanullo wanted to recheck her thyroid since it has been six months. Please advise, thank you!  Call pt @ [phone] cell) or [phone] (home)

## 2017-05-01 NOTE — Telephone Encounter (Signed)
Scheduled patient OV for 05/11/17 c/o dry itching skin.

## 2017-05-11 ENCOUNTER — Ambulatory Visit (INDEPENDENT_AMBULATORY_CARE_PROVIDER_SITE_OTHER): Payer: Medicare Other | Admitting: Internal Medicine

## 2017-05-11 ENCOUNTER — Encounter: Payer: Self-pay | Admitting: Internal Medicine

## 2017-05-11 ENCOUNTER — Telehealth: Payer: Self-pay | Admitting: Internal Medicine

## 2017-05-11 VITALS — BP 116/72 | HR 62 | Temp 98.0°F | Resp 16 | Ht 62.0 in | Wt 132.4 lb

## 2017-05-11 DIAGNOSIS — N951 Menopausal and female climacteric states: Secondary | ICD-10-CM | POA: Diagnosis not present

## 2017-05-11 DIAGNOSIS — E039 Hypothyroidism, unspecified: Secondary | ICD-10-CM | POA: Diagnosis not present

## 2017-05-11 DIAGNOSIS — L299 Pruritus, unspecified: Secondary | ICD-10-CM

## 2017-05-11 DIAGNOSIS — Z8719 Personal history of other diseases of the digestive system: Secondary | ICD-10-CM

## 2017-05-11 DIAGNOSIS — E538 Deficiency of other specified B group vitamins: Secondary | ICD-10-CM

## 2017-05-11 DIAGNOSIS — E785 Hyperlipidemia, unspecified: Secondary | ICD-10-CM

## 2017-05-11 DIAGNOSIS — R5383 Other fatigue: Secondary | ICD-10-CM

## 2017-05-11 DIAGNOSIS — R030 Elevated blood-pressure reading, without diagnosis of hypertension: Secondary | ICD-10-CM | POA: Diagnosis not present

## 2017-05-11 DIAGNOSIS — Z8711 Personal history of peptic ulcer disease: Secondary | ICD-10-CM

## 2017-05-11 DIAGNOSIS — K589 Irritable bowel syndrome without diarrhea: Secondary | ICD-10-CM | POA: Diagnosis not present

## 2017-05-11 DIAGNOSIS — G250 Essential tremor: Secondary | ICD-10-CM | POA: Diagnosis not present

## 2017-05-11 LAB — IBC PANEL
IRON: 113 ug/dL (ref 42–145)
SATURATION RATIOS: 34.8 % (ref 20.0–50.0)
Transferrin: 232 mg/dL (ref 212.0–360.0)

## 2017-05-11 LAB — COMPREHENSIVE METABOLIC PANEL
ALT: 13 U/L (ref 0–35)
AST: 14 U/L (ref 0–37)
Albumin: 4.2 g/dL (ref 3.5–5.2)
Alkaline Phosphatase: 111 U/L (ref 39–117)
BUN: 13 mg/dL (ref 6–23)
CALCIUM: 10.1 mg/dL (ref 8.4–10.5)
CHLORIDE: 104 meq/L (ref 96–112)
CO2: 28 meq/L (ref 19–32)
Creatinine, Ser: 0.75 mg/dL (ref 0.40–1.20)
GFR: 79.69 mL/min (ref >60.00)
Glucose, Bld: 88 mg/dL (ref 70–99)
POTASSIUM: 4.9 meq/L (ref 3.5–5.1)
Sodium: 139 mEq/L (ref 135–145)
Total Bilirubin: 0.2 mg/dL (ref 0.2–1.2)
Total Protein: 7 g/dL (ref 6.0–8.3)

## 2017-05-11 LAB — CBC WITH DIFFERENTIAL/PLATELET
BASOS PCT: 1.2 % (ref 0.0–3.0)
Basophils Absolute: 0.1 10*3/uL (ref 0.0–0.1)
EOS ABS: 0.9 10*3/uL — AB (ref 0.0–0.7)
EOS PCT: 11.2 % — AB (ref 0.0–5.0)
HEMATOCRIT: 39.9 % (ref 36.0–46.0)
Hemoglobin: 13.1 g/dL (ref 12.0–15.0)
LYMPHS PCT: 33.2 % (ref 12.0–46.0)
Lymphs Abs: 2.6 10*3/uL (ref 0.7–4.0)
MCHC: 32.8 g/dL (ref 30.0–36.0)
MCV: 103.7 fl — AB (ref 78.0–100.0)
Monocytes Absolute: 0.5 10*3/uL (ref 0.1–1.0)
Monocytes Relative: 6.6 % (ref 3.0–12.0)
NEUTROS ABS: 3.8 10*3/uL (ref 1.4–7.7)
Neutrophils Relative %: 47.8 % (ref 43.0–77.0)
PLATELETS: 364 10*3/uL (ref 150.0–400.0)
RBC: 3.85 Mil/uL — ABNORMAL LOW (ref 3.87–5.11)
RDW: 14.6 % (ref 11.5–15.5)
WBC: 8 10*3/uL (ref 4.0–10.5)

## 2017-05-11 LAB — FERRITIN: FERRITIN: 50.7 ng/mL (ref 10.0–291.0)

## 2017-05-11 LAB — VITAMIN B12: VITAMIN B 12: 761 pg/mL (ref 211–911)

## 2017-05-11 LAB — T4: T4 TOTAL: 8.4 ug/dL (ref 4.5–12.0)

## 2017-05-11 LAB — TSH: TSH: 2.12 u[IU]/mL (ref 0.35–4.50)

## 2017-05-11 NOTE — Patient Instructions (Addendum)
You can use 1/2 tablet 12.5 mg of Benadryl at bedtime for nocturnal itching,  but you should also consider adding one of these newer second generation antihistamines on a daily basis  that are longer acting, non sedating and  available OTC:  Generic  Zyrtec, which is cetirizine.   generic Allegra , available generically as fexofenadine ; comes in 60 mg and 180 mg once daily strengths.    Generic Claritin :  also available as loratidine .    Try using a moisturizing body wash , as dry skin can cause the type of itching you are describing as well  We will check a celiac panel today given your concerns about wheat allergy   IF YOUR B12 LEVEL IS NORMAL, please consider a trial of wellbutrin,,  An antidepressant

## 2017-05-11 NOTE — Progress Notes (Signed)
Subjective:  Patient ID: Vanessa Brown, female    DOB: Jun 22, 1941  Age: 76 y.o. MRN: [id number]  CC: The primary encounter diagnosis was Pruritus. Diagnoses of B12 deficiency, Fatigue, unspecified type, Acquired hypothyroidism, Benign essential tremor, Elevated blood pressure reading in office without diagnosis of hypertension, History of gastric ulcer, Hyperlipidemia LDL goal <130, Irritable bowel syndrome without diarrhea, and Menopause syndrome were also pertinent to this visit.  HPI [name] presents for evaluation of multiple complaints.  40 mintues spent with patient today in face to face time over 50% in counselling.  1)  EVALUATION OF A DRY PRURITIC RASH . Sporadic,  Occurs at various places: right shoulder,  Left inner thigh. ,  Lateral ankle ,  Neck  "gets; really red if I scratch it" but resolves in < 24 hours  if she leaves it ALONE. Has artificial nails which are very hard.   No prodromal or residual papules.  2)History of food allergy to all  shellfish . Wonders if she is allergic to wheat because she gets bloated after she eats things made from wheat.  No diarrhea,  No papular or vesicular rash.  no prior EGD, last colonoscopy 2015.       3) New onset fatigue.  Never used to need a nap during the day.  Lately  FALLS ASLEEP in the afternoon during passive activities  SHORT NAPS of < 1 hours wakes up refreshed. [name] DIED A YEAR AGO.  HAS A LOT OF ALONE TIME NOW, denies depression but does not actively seek the company of others.  Thinks her symptoms are due to the tremor  MEDICATION ,  GETS HYPER WITH CAFFEINE BUT IT AGGRAVATES HER TREMOR.   No trouble finishing her daily exercise routine soes  30 minutes  4 or 5 days per week   4) ESSENTIAL TREMOR :mos noticeable with WRITING.  TAKING PRIMODONE  Taking oral b12 1000 mcg daily   Changing drug store. [facility] in [place]    Outpatient Medications Prior to Visit  Medication Sig Dispense Refill  . Biotin 5000 MCG CAPS Take 1  capsule by mouth daily.    . cyanocobalamin 1000 MCG tablet Take 1,000 mcg by mouth daily.    Marland Kitchen estradiol (CLIMARA - DOSED IN MG/24 HR) 0.025 mg/24hr patch apply 1 patch every week 4 patch 11  . levothyroxine (SYNTHROID, LEVOTHROID) 50 MCG tablet take 1 tablet by mouth once daily 90 tablet 3  . primidone (MYSOLINE) 50 MG tablet Take 250 mg by mouth 2 (two) times daily.     Marland Kitchen albuterol (PROVENTIL HFA) 108 (90 Base) MCG/ACT inhaler Inhale 2 puffs into the lungs every 6 (six) hours as needed for wheezing or shortness of breath. (Patient not taking: Reported on 12/15/2016) 1 Inhaler 1  . Black Cohosh 540 MG CAPS Take 1 capsule by mouth daily.    . calcium carbonate (OS-CAL) 600 MG TABS Take 600 mg by mouth daily with breakfast. Reported on 12/14/2015    . Red Yeast Rice Extract 600 MG TABS Take 1 tablet (600 mg total) by mouth 2 (two) times daily. (Patient not taking: Reported on 05/11/2017)     No facility-administered medications prior to visit.     Review of Systems;  Patient denies headache, fevers, malaise, unintentional weight loss, skin rash, eye pain, sinus congestion and sinus pain, sore throat, dysphagia,  hemoptysis , cough, dyspnea, wheezing, chest pain, palpitations, orthopnea, edema, abdominal pain, nausea, melena, diarrhea, constipation, flank pain, dysuria, hematuria, urinary  Frequency, nocturia, numbness, tingling, seizures,  Focal weakness, Loss of consciousness,  Tremor, insomnia, depression, anxiety, and suicidal ideation.      Objective:  BP 116/72 (BP Location: Left Arm, Patient Position: Sitting, Cuff Size: Normal)   Pulse 62   Temp 98 F (36.7 C) (Oral)   Resp 16   Ht 5\' 2"  (1.575 m)   Wt 132 lb 6.4 oz (60.1 kg)   SpO2 97%   BMI 24.22 kg/m   BP Readings from Last 3 Encounters:  05/11/17 116/72  12/15/16 118/64  12/15/16 118/64    Wt Readings from Last 3 Encounters:  05/11/17 132 lb 6.4 oz (60.1 kg)  12/15/16 133 lb (60.3 kg)  12/15/16 133 lb (60.3 kg)     General appearance: alert, cooperative and appears stated age Ears: normal TM's and external ear canals both ears Throat: lips, mucosa, and tongue normal; teeth and gums normal Neck: no adenopathy, no carotid bruit, supple, symmetrical, trachea midline and thyroid not enlarged, symmetric, no tenderness/mass/nodules Back: symmetric, no curvature. ROM normal. No CVA tenderness. Lungs: clear to auscultation bilaterally Heart: regular rate and rhythm, S1, S2 normal, no murmur, click, rub or gallop Abdomen: soft, non-tender; bowel sounds normal; no masses,  no organomegaly Pulses: 2+ and symmetric Skin: Skin color, texture, turgor normal. No rashes or lesions Lymph nodes: Cervical, supraclavicular, and axillary nodes normal.  No results found for: HGBA1C  Lab Results  Component Value Date   CREATININE 0.75 05/11/2017   CREATININE 0.77 12/12/2016   CREATININE 0.82 12/14/2015    Lab Results  Component Value Date   WBC 8.0 05/11/2017   HGB 13.1 05/11/2017   HCT 39.9 05/11/2017   PLT 364.0 05/11/2017   GLUCOSE 88 05/11/2017   CHOL 254 (H) 12/12/2016   TRIG 72.0 12/12/2016   HDL 81.20 12/12/2016   LDLDIRECT 93.0 12/14/2015   LDLCALC 158 (H) 12/12/2016   ALT 13 05/11/2017   AST 14 05/11/2017   NA 139 05/11/2017   K 4.9 05/11/2017   CL 104 05/11/2017   CREATININE 0.75 05/11/2017   BUN 13 05/11/2017   CO2 28 05/11/2017   TSH 2.12 05/11/2017    Mm Digital Screening Bilateral  Result Date: 01/15/2017 CLINICAL DATA:  Screening. EXAM: DIGITAL SCREENING BILATERAL MAMMOGRAM WITH CAD COMPARISON:  Previous exam(s). ACR Breast Density Category c: The breast tissue is heterogeneously dense, which may obscure small masses. FINDINGS: There are no findings suspicious for malignancy. Images were processed with CAD. IMPRESSION: No mammographic evidence of malignancy. A result letter of this screening mammogram will be mailed directly to the patient. RECOMMENDATION: Screening mammogram in one  year. (Code:SM-B-01Y) BI-RADS CATEGORY  1: Negative. Electronically Signed   By: Dalphine HandingErin  Shaw M.D.   On: 01/15/2017 06:46    Assessment & Plan:   Problem List Items Addressed This Visit    Benign essential tremor    Now managed with primidone per Neurology consultation. No changes today unless fatigue does not improve      Elevated blood pressure reading in office without diagnosis of hypertension    Repeat reading is normal.  No indication for medication       Fatigue    Screening labs normal.  No history of snoring.  Already participating in regular exercise program  30 minutes of aerobic activity 5 days per week.  Advised her to consider trial of antidepressant given loss of husband one year ago and high risk for depression.   Lab Results  Component Value Date  CREATININE 0.79 11/19/2015   Lab Results  Component Value Date   ALT 14 11/19/2015   AST 18 11/19/2015   ALKPHOS 69 11/19/2015   BILITOT 0.6 11/19/2015   Lab Results  Component Value Date   TSH 1.68 11/19/2015   Lab Results  Component Value Date   WBC 8.6 11/19/2015   HGB 14.7 11/19/2015   HCT 43.8 11/19/2015   MCV 92.7 11/19/2015   PLT 248.0 11/19/2015    Lab Results  Component Value Date   TSH 2.12 05/11/2017   Lab Results  Component Value Date   WBC 8.0 05/11/2017   HGB 13.1 05/11/2017   HCT 39.9 05/11/2017   MCV 103.7 (H) 05/11/2017   PLT 364.0 05/11/2017   Lab Results  Component Value Date   VITAMINB12 761 05/11/2017   Lab Results  Component Value Date   CREATININE 0.75 05/11/2017         Relevant Orders   Ferritin (Completed)   IBC panel (Completed)   History of gastric ulcer    She denies abd pain and does not use NSAIDs.  No indication to resume PPI       Hyperlipidemia LDL goal <130    She did not tolerate low dose crestor.  continue red yeast rice.  LDL < 160  Lab Results  Component Value Date   CHOL 254 (H) 12/12/2016   HDL 81.20 12/12/2016   LDLCALC 158 (H) 12/12/2016    LDLDIRECT 93.0 12/14/2015   TRIG 72.0 12/12/2016   CHOLHDL 3 12/12/2016   Lab Results  Component Value Date   ALT 13 05/11/2017   AST 14 05/11/2017   ALKPHOS 111 05/11/2017   BILITOT 0.2 05/11/2017         Hypothyroid    Thyroid function is WNL on current dose.  No current changes needed.   Lab Results  Component Value Date   TSH 2.12 05/11/2017         Relevant Orders   TSH (Completed)   T4   Irritable bowel syndrome (IBS)    Checking celiac sprue panel given patient's concern for wheat allergy,  But history and exam /symptoms are not compelling for sprue      Menopause syndrome    Prior trial of black cohosh and vitamin B12  Did not help.  Has resumed black cohosh due to cost of estrogen patches.        Pruritus - Primary    She has no rash anywhere and liver function is normal and she has only a mild relative esoinophilia.  Dry skin vs neurotic itching presumed.  Trial of benadryl at night and body wash moisturizing soap  Lab Results  Component Value Date   ALT 13 05/11/2017   AST 14 05/11/2017   ALKPHOS 111 05/11/2017   BILITOT 0.2 05/11/2017   Lab Results  Component Value Date   WBC 8.0 05/11/2017   HGB 13.1 05/11/2017   HCT 39.9 05/11/2017   MCV 103.7 (H) 05/11/2017   PLT 364.0 05/11/2017         Relevant Orders   Celiac Disease Ab Screen w/Rfx   CBC with Differential/Platelet (Completed)   Comprehensive metabolic panel (Completed)    Other Visit Diagnoses    B12 deficiency       Relevant Orders   Vitamin B12 (Completed)   Folate RBC      I have discontinued Ms. Conaway's calcium carbonate, Black Cohosh, albuterol, and Red Yeast Rice Extract. I am also having her  maintain her primidone, cyanocobalamin, Biotin, estradiol, levothyroxine, and Multiple Vitamins-Minerals (MULTIVITAMIN ADULT EXTRA C PO).  Meds ordered this encounter  Medications  . Multiple Vitamins-Minerals (MULTIVITAMIN ADULT EXTRA C PO)    Sig: Take by mouth.     Medications Discontinued During This Encounter  Medication Reason  . albuterol (PROVENTIL HFA) 108 (90 Base) MCG/ACT inhaler Patient has not taken in last 30 days  . Black Cohosh 540 MG CAPS Patient has not taken in last 30 days  . calcium carbonate (OS-CAL) 600 MG TABS Patient has not taken in last 30 days  . Red Yeast Rice Extract 600 MG TABS Patient has not taken in last 30 days    Follow-up: No Follow-up on file.   Sherlene Shams, MD

## 2017-05-11 NOTE — Telephone Encounter (Signed)
FYI

## 2017-05-11 NOTE — Telephone Encounter (Signed)
Patient states that she is having problems with her computer so please call her with her test results on her cell # [phone]

## 2017-05-12 ENCOUNTER — Telehealth: Payer: Self-pay | Admitting: Internal Medicine

## 2017-05-12 ENCOUNTER — Encounter: Payer: Self-pay | Admitting: Internal Medicine

## 2017-05-12 DIAGNOSIS — L299 Pruritus, unspecified: Secondary | ICD-10-CM | POA: Insufficient documentation

## 2017-05-12 LAB — CELIAC DISEASE AB SCREEN W/RFX
Antigliadin Abs, IgA: 3 units (ref 0–19)
IgA/Immunoglobulin A, Serum: 227 mg/dL (ref 64–422)
Transglutaminase IgA: <2 U/mL (ref 0–3)

## 2017-05-12 LAB — FOLATE RBC: RBC FOLATE: 950 ng/mL (ref >280)

## 2017-05-12 NOTE — Assessment & Plan Note (Signed)
She did not tolerate low dose crestor.  continue red yeast rice.  LDL < 160  Lab Results  Component Value Date   CHOL 254 (H) 12/12/2016   HDL 81.20 12/12/2016   LDLCALC 158 (H) 12/12/2016   LDLDIRECT 93.0 12/14/2015   TRIG 72.0 12/12/2016   CHOLHDL 3 12/12/2016   Lab Results  Component Value Date   ALT 13 05/11/2017   AST 14 05/11/2017   ALKPHOS 111 05/11/2017   BILITOT 0.2 05/11/2017

## 2017-05-12 NOTE — Assessment & Plan Note (Signed)
Thyroid function is WNL on current dose.  No current changes needed.   Lab Results  Component Value Date   TSH 2.12 05/11/2017

## 2017-05-12 NOTE — Assessment & Plan Note (Addendum)
She has no rash anywhere and liver function is normal and she has only a mild relative esoinophilia.  Dry skin vs neurotic itching presumed.  Trial of benadryl at night and body wash moisturizing soap  Lab Results  Component Value Date   ALT 13 05/11/2017   AST 14 05/11/2017   ALKPHOS 111 05/11/2017   BILITOT 0.2 05/11/2017   Lab Results  Component Value Date   WBC 8.0 05/11/2017   HGB 13.1 05/11/2017   HCT 39.9 05/11/2017   MCV 103.7 (H) 05/11/2017   PLT 364.0 05/11/2017

## 2017-05-12 NOTE — Assessment & Plan Note (Signed)
Repeat reading is normal.  No indication for medication

## 2017-05-12 NOTE — Assessment & Plan Note (Signed)
Checking celiac sprue panel given patient's concern for wheat allergy,  But history and exam /symptoms are not compelling for sprue

## 2017-05-12 NOTE — Assessment & Plan Note (Addendum)
Now managed with primidone per Neurology consultation. No changes today unless fatigue does not improve

## 2017-05-12 NOTE — Assessment & Plan Note (Signed)
Screening labs normal.  No history of snoring.  Already participating in regular exercise program  30 minutes of aerobic activity 5 days per week.  Advised her to consider trial of antidepressant given loss of husband one year ago and high risk for depression.   Lab Results  Component Value Date   CREATININE 0.79 11/19/2015   Lab Results  Component Value Date   ALT 14 11/19/2015   AST 18 11/19/2015   ALKPHOS 69 11/19/2015   BILITOT 0.6 11/19/2015   Lab Results  Component Value Date   TSH 1.68 11/19/2015   Lab Results  Component Value Date   WBC 8.6 11/19/2015   HGB 14.7 11/19/2015   HCT 43.8 11/19/2015   MCV 92.7 11/19/2015   PLT 248.0 11/19/2015    Lab Results  Component Value Date   TSH 2.12 05/11/2017   Lab Results  Component Value Date   WBC 8.0 05/11/2017   HGB 13.1 05/11/2017   HCT 39.9 05/11/2017   MCV 103.7 (H) 05/11/2017   PLT 364.0 05/11/2017   Lab Results  Component Value Date   VITAMINB12 761 05/11/2017   Lab Results  Component Value Date   CREATININE 0.75 05/11/2017

## 2017-05-12 NOTE — Assessment & Plan Note (Signed)
Prior trial of black cohosh and vitamin B12  Did not help.  Has resumed black cohosh due to cost of estrogen patches.

## 2017-05-12 NOTE — Telephone Encounter (Signed)
Please call patient and give her the lab results that I sent  via mychart  Her computer is not working.

## 2017-05-12 NOTE — Assessment & Plan Note (Signed)
She denies abd pain and does not use NSAIDs.  No indication to resume PPI

## 2017-05-13 NOTE — Telephone Encounter (Signed)
Spoke with pt and informed her of her lab results and that she would need to stop taking the primidone. Pt gave a verbal understanding.

## 2017-05-26 NOTE — Telephone Encounter (Signed)
Mailed unread message to patient.  

## 2017-06-16 ENCOUNTER — Telehealth: Payer: Self-pay | Admitting: *Deleted

## 2017-06-16 DIAGNOSIS — R21 Rash and other nonspecific skin eruption: Secondary | ICD-10-CM

## 2017-06-16 NOTE — Telephone Encounter (Signed)
Rash is under the skin and sometimes it is like raised bumps. Rash comes in random spots and comes and goes randomly. This issue has been going on off and on since July. Patient stopped taking primidone for about a week after your appointment with her and tremors got worse so she started taking again. Patient stated that she has been on that medication for atleast 20 years. Patient has been taking benadryl as relief when the rash comes up. Patient is wondering if there is anything you recommend to take or do?

## 2017-06-16 NOTE — Telephone Encounter (Signed)
No, now without seeing her, seeing the rash .  If it does not occur when she is off the primidone,  It may be the primodone, so I suggest stopping it for a few weeks to see if the rash resolves.

## 2017-06-16 NOTE — Telephone Encounter (Signed)
Patient says that rash still occurs while on the primidone so she doesn't want to stop it. We have no appointments for her to be evaluated today.

## 2017-06-16 NOTE — Telephone Encounter (Signed)
Please advise 

## 2017-06-16 NOTE — Telephone Encounter (Signed)
Pt continues to have the rash that she was seen in the office for in July. Pt requested a call to discuss further care to treat the rash. This rash goes away and returns at random times.  Pt contact [phone]

## 2017-06-17 NOTE — Telephone Encounter (Signed)
There is a [place] allergy group,  In [place],  The only group in town  . NOT the same [place] as [place]. Will make referral.  She may want to see her dermatologist first

## 2017-06-17 NOTE — Telephone Encounter (Signed)
She would like to see the [facility] in [place] and try over the counter meds you recommended. Please place referral. Thanks

## 2017-06-17 NOTE — Telephone Encounter (Signed)
Left message to return call to office.

## 2017-06-17 NOTE — Telephone Encounter (Signed)
SHE CAN TYR TAKING BENADRYL,  XYRTEC OR ALLEGRA

## 2017-07-01 DIAGNOSIS — L508 Other urticaria: Secondary | ICD-10-CM | POA: Diagnosis not present

## 2017-07-01 DIAGNOSIS — J305 Allergic rhinitis due to food: Secondary | ICD-10-CM | POA: Diagnosis not present

## 2017-07-01 DIAGNOSIS — B379 Candidiasis, unspecified: Secondary | ICD-10-CM | POA: Diagnosis not present

## 2017-07-01 DIAGNOSIS — J301 Allergic rhinitis due to pollen: Secondary | ICD-10-CM | POA: Diagnosis not present

## 2017-07-08 DIAGNOSIS — G250 Essential tremor: Secondary | ICD-10-CM | POA: Diagnosis not present

## 2017-08-03 DIAGNOSIS — Z23 Encounter for immunization: Secondary | ICD-10-CM | POA: Diagnosis not present

## 2017-08-13 DIAGNOSIS — J301 Allergic rhinitis due to pollen: Secondary | ICD-10-CM | POA: Diagnosis not present

## 2017-10-26 ENCOUNTER — Other Ambulatory Visit: Payer: Self-pay | Admitting: Internal Medicine

## 2017-12-14 ENCOUNTER — Other Ambulatory Visit: Payer: Self-pay | Admitting: Internal Medicine

## 2017-12-16 ENCOUNTER — Ambulatory Visit: Payer: 59

## 2017-12-17 ENCOUNTER — Other Ambulatory Visit: Payer: Self-pay | Admitting: Internal Medicine

## 2017-12-17 DIAGNOSIS — Z1231 Encounter for screening mammogram for malignant neoplasm of breast: Secondary | ICD-10-CM

## 2017-12-22 ENCOUNTER — Ambulatory Visit (INDEPENDENT_AMBULATORY_CARE_PROVIDER_SITE_OTHER): Payer: Medicare Other

## 2017-12-22 VITALS — BP 110/70 | HR 67 | Temp 97.8°F | Resp 14 | Ht 62.0 in | Wt 128.4 lb

## 2017-12-22 DIAGNOSIS — L299 Pruritus, unspecified: Secondary | ICD-10-CM | POA: Diagnosis not present

## 2017-12-22 DIAGNOSIS — E039 Hypothyroidism, unspecified: Secondary | ICD-10-CM | POA: Diagnosis not present

## 2017-12-22 DIAGNOSIS — Z0000 Encounter for general adult medical examination without abnormal findings: Secondary | ICD-10-CM

## 2017-12-22 DIAGNOSIS — E7849 Other hyperlipidemia: Secondary | ICD-10-CM

## 2017-12-22 LAB — CBC WITH DIFFERENTIAL/PLATELET
Basophils Absolute: 0.1 10*3/uL (ref 0.0–0.1)
Basophils Relative: 1.5 % (ref 0.0–3.0)
EOS ABS: 0.6 10*3/uL (ref 0.0–0.7)
EOS PCT: 8.5 % — AB (ref 0.0–5.0)
HCT: 39.6 % (ref 36.0–46.0)
Hemoglobin: 13.3 g/dL (ref 12.0–15.0)
LYMPHS ABS: 2.9 10*3/uL (ref 0.7–4.0)
Lymphocytes Relative: 38.3 % (ref 12.0–46.0)
MCHC: 33.5 g/dL (ref 30.0–36.0)
MCV: 102.8 fl — ABNORMAL HIGH (ref 78.0–100.0)
MONO ABS: 0.6 10*3/uL (ref 0.1–1.0)
Monocytes Relative: 8.3 % (ref 3.0–12.0)
NEUTROS PCT: 43.4 % (ref 43.0–77.0)
Neutro Abs: 3.2 10*3/uL (ref 1.4–7.7)
Platelets: 396 10*3/uL (ref 150.0–400.0)
RBC: 3.85 Mil/uL — ABNORMAL LOW (ref 3.87–5.11)
RDW: 14.2 % (ref 11.5–15.5)
WBC: 7.5 10*3/uL (ref 4.0–10.5)

## 2017-12-22 LAB — LIPID PANEL
CHOL/HDL RATIO: 3
Cholesterol: 249 mg/dL — ABNORMAL HIGH (ref 0–200)
HDL: 82.8 mg/dL (ref >39.00)
LDL CALC: 147 mg/dL — AB (ref 0–99)
NONHDL: 166.21
Triglycerides: 94 mg/dL (ref 0.0–149.0)
VLDL: 18.8 mg/dL (ref 0.0–40.0)

## 2017-12-22 LAB — COMPREHENSIVE METABOLIC PANEL
ALT: 15 U/L (ref 0–35)
AST: 15 U/L (ref 0–37)
Albumin: 4.3 g/dL (ref 3.5–5.2)
Alkaline Phosphatase: 85 U/L (ref 39–117)
BUN: 17 mg/dL (ref 6–23)
CHLORIDE: 104 meq/L (ref 96–112)
CO2: 26 meq/L (ref 19–32)
Calcium: 9.8 mg/dL (ref 8.4–10.5)
Creatinine, Ser: 0.82 mg/dL (ref 0.40–1.20)
GFR: 71.77 mL/min (ref >60.00)
GLUCOSE: 95 mg/dL (ref 70–99)
Potassium: 4.4 mEq/L (ref 3.5–5.1)
SODIUM: 136 meq/L (ref 135–145)
Total Bilirubin: 0.3 mg/dL (ref 0.2–1.2)
Total Protein: 7.4 g/dL (ref 6.0–8.3)

## 2017-12-22 LAB — TSH: TSH: 2.21 u[IU]/mL (ref 0.35–4.50)

## 2017-12-22 LAB — LDL CHOLESTEROL, DIRECT: Direct LDL: 137 mg/dL

## 2017-12-22 LAB — T4, FREE: Free T4: 0.89 ng/dL (ref 0.60–1.60)

## 2017-12-22 NOTE — Progress Notes (Signed)
Subjective:   Vanessa Brown is a 77 y.o. female who presents for Medicare Annual (Subsequent) preventive examination.  Review of Systems:  No ROS.  Medicare Wellness Visit. Additional risk factors are reflected in the social history. Cardiac Risk Factors include: advanced age (>31men, >55 women)     Objective:     Vitals: BP 110/70 (BP Location: Left Arm, Patient Position: Sitting, Cuff Size: Normal)   Pulse 67   Temp 97.8 F (36.6 C) (Oral)   Resp 14   Ht 5\' 2"  (1.575 m)   Wt 128 lb 6.4 oz (58.2 kg)   SpO2 97%   BMI 23.48 kg/m   Body mass index is 23.48 kg/m.  Advanced Directives 12/22/2017 12/15/2016  Does Patient Have a Medical Advance Directive? Yes Yes  Type of Estate agent of [address];Living will Healthcare Power of [place];Living will  Does patient want to make changes to medical advance directive? No - Patient declined No - Patient declined  Copy of Healthcare Power of Attorney in Chart? No - copy requested No - copy requested    Tobacco Social History   Tobacco Use  Smoking Status Former Smoker  . Types: Cigarettes  . Last attempt to quit: 09/03/1999  . Years since quitting: 18.3  Smokeless Tobacco Never Used     Counseling given: Not Answered   Clinical Intake:  Pre-visit preparation completed: Yes  Pain : No/denies pain     Nutritional Status: BMI of 19-24  Normal Diabetes: No  How often do you need to have someone help you when you read instructions, pamphlets, or other written materials from your doctor or pharmacy?: 1 - Never  Interpreter Needed?: No     Past Medical History:  Diagnosis Date  . Anxiety   . Arthritis    spine, prior Pain Clinic patient  . Cervical back pain with evidence of disc disease 2007   prior epidural steorid injection s  . Duodenal ulcer   . Gastric ulcer    H Pylori positive  . GERD (gastroesophageal reflux disease)   . Irritable bowel syndrome (IBS)    Dorena Cookey, [facility]  .  Neuropathy    "toilet seat", Sherryll Burger), caused  by sitting on commode  . Screening for breast cancer   . Screening for cervical cancer   . Tremor, essential    treated with primidone 25 mg qhs   Past Surgical History:  Procedure Laterality Date  . ABDOMINAL HYSTERECTOMY  1967  . BIOPSY BREAST     benign,. right breast ([place])  . BREAST BIOPSY Right 2012   NEG  . BREAST BIOPSY Left 2007   NEG  . BREAST SURGERY     [place], benign biopsies  . ROTATOR CUFF REPAIR  2004   right shoulder , [place]   Family History  Problem Relation Age of Onset  . Diabetes Mother   . Parkinson's disease Father   . Dementia Father   . Diabetes Son   . Breast cancer Neg Hx    Social History   Socioeconomic History  . Marital status: Married    Spouse name: None  . Number of children: None  . Years of education: None  . Highest education level: None  Social Needs  . Financial resource strain: Not hard at all  . Food insecurity - worry: Never true  . Food insecurity - inability: Never true  . Transportation needs - medical: No  . Transportation needs - non-medical: No  Occupational History  .  None  Tobacco Use  . Smoking status: Former Smoker    Types: Cigarettes    Last attempt to quit: 09/03/1999    Years since quitting: 18.3  . Smokeless tobacco: Never Used  Substance and Sexual Activity  . Alcohol use: Yes    Alcohol/week: 0.0 oz    Comment: OCC  . Drug use: No  . Sexual activity: No  Other Topics Concern  . None  Social History Narrative  . None    Outpatient Encounter Medications as of 12/22/2017  Medication Sig  . Biotin 5000 MCG CAPS Take 1 capsule by mouth daily.  . cyanocobalamin 1000 MCG tablet Take 1,000 mcg by mouth daily.  Marland Kitchen estradiol (CLIMARA - DOSED IN MG/24 HR) 0.025 mg/24hr patch apply 1 patch every week  . levothyroxine (SYNTHROID, LEVOTHROID) 50 MCG tablet TAKE 1 TABLET BY MOUTH ONCE DAILY  . Multiple Vitamins-Minerals (MULTIVITAMIN ADULT EXTRA C PO) Take  by mouth.  . primidone (MYSOLINE) 50 MG tablet Take 250 mg by mouth 2 (two) times daily.    No facility-administered encounter medications on file as of 12/22/2017.     Activities of Daily Living In your present state of health, do you have any difficulty performing the following activities: 12/22/2017  Hearing? N  Vision? N  Difficulty concentrating or making decisions? Y  Comment Some difficulty remembering names; memory delay  Walking or climbing stairs? N  Dressing or bathing? N  Doing errands, shopping? N  Preparing Food and eating ? N  Using the Toilet? N  In the past six months, have you accidently leaked urine? Y  Comment Managed with a daily pad  Do you have problems with loss of bowel control? N  Managing your Medications? N  Managing your Finances? N  Housekeeping or managing your Housekeeping? N  Some recent data might be hidden    Patient Care Team: Sherlene Shams, MD as PCP - General (Internal Medicine)    Assessment:   This is a routine wellness examination for Vanessa Brown.  The goal of the wellness visit is to assist the patient how to close the gaps in care and create a preventative care plan for the patient.   The roster of all physicians providing medical care to patient is listed in the Snapshot section of the chart.  Osteoporosis risk reviewed.    Safety issues reviewed; Lives alone.  Smoke and carbon monoxide detectors in the home. No firearms or firearms locked in a safe within the home. Wears seatbelts when driving or riding with others. No violence in the home.  They do not have excessive sun exposure.  Discussed the need for sun protection: hats, long sleeves and the use of sunscreen if there is significant sun exposure.  Patient is alert, normal appearance, oriented to person/place/and time.  Correctly identified the president of the [place] and recalls of 3/3 words. Performs simple calculations and can read correct time from watch face.  Displays  appropriate judgement.  No new identified risk were noted.  No failures at ADL's or IADL's.    BMI- discussed the importance of a healthy diet, water intake and the benefits of aerobic exercise. Educational material provided.   24 hour diet recall: Regular diet with limited dairy, wheat.  Daily water intake: 64 ouces   Dental- every 6 months.  Eye- Visual acuity not assessed per patient preference since they have regular follow up with the ophthalmologist.  Wears corrective lenses.  Sleep patterns- Sleeps 6 hours at night.  Wakes feeling rested.  TDAP vaccine deferred per patient preference.  Follow up with insurance.  Educational material provided.  Mammogram scheduled 03/28./2019.  Fasting labs completed per patient request.  Patient Concerns: None at this time. Follow up with PCP as needed.  Exercise Activities and Dietary recommendations Current Exercise Habits: Home exercise routine, Type of exercise: stretching;walking(dance), Time (Minutes): 30, Frequency (Times/Week): 5, Weekly Exercise (Minutes/Week): 150, Intensity: Moderate  Goals    . Maintain Healthy Lifestyle     Exercise 5 days a week  Healthy diet Stay hydrated       Fall Risk Fall Risk  12/22/2017 12/15/2016 12/14/2015 11/22/2014 11/19/2012  Falls in the past year? Yes Yes No No No  Number falls in past yr: - 1 - - -  Injury with Fall? - No - - -   I Depression Screen PHQ 2/9 Scores 12/22/2017 12/15/2016 12/14/2015 11/22/2014  PHQ - 2 Score 0 0 0 0     Cognitive Function     6CIT Screen 12/22/2017 12/15/2016  What Year? 0 points 0 points  What month? 0 points 0 points  What time? 0 points 0 points  Count back from 20 0 points 0 points  Months in reverse 0 points 0 points  Repeat phrase 0 points 0 points  Total Score 0 0    Immunization History  Administered Date(s) Administered  . Influenza Split 08/25/2011, 07/24/2014  . Influenza-Unspecified 08/03/2012, 07/25/2015, 08/05/2016, 08/03/2017  .  Pneumococcal Conjugate-13 11/22/2014  . Pneumococcal Polysaccharide-23 11/21/2006   Screening Tests Health Maintenance  Topic Date Due  . TETANUS/TDAP  07/29/1960  . INFLUENZA VACCINE  Completed  . DEXA SCAN  Completed  . PNA vac Low Risk Adult  Completed      Plan:    End of life planning; Advance aging; Advanced directives discussed. Copy of current HCPOA/Living Will requested.    I have personally reviewed and noted the following in the patient's chart:   . Medical and social history . Use of alcohol, tobacco or illicit drugs  . Current medications and supplements . Functional ability and status . Nutritional status . Physical activity . Advanced directives . List of other physicians . Hospitalizations, surgeries, and ER visits in previous 12 months . Vitals . Screenings to include cognitive, depression, and falls . Referrals and appointments  In addition, I have reviewed and discussed with patient certain preventive protocols, quality metrics, and best practice recommendations. A written personalized care plan for preventive services as well as general preventive health recommendations were provided to patient.     Ashok PallOBrien-Blaney, Denisa L, LPN  1/6/10963/02/2018

## 2017-12-22 NOTE — Patient Instructions (Addendum)
  Ms. Vanessa Brown , Thank you for taking time to come for your Medicare Wellness Visit. I appreciate your ongoing commitment to your health goals. Please review the following plan we discussed and let me know if I can assist you in the future.   Follow up with Dr. Darrick Huntsmanullo as needed.    Bring a copy of your Health Care Power of Attorney and/or Living Will to be scanned into chart.  Have a great day!  These are the goals we discussed: Goals    . Maintain Healthy Lifestyle     Exercise 5 days a week  Healthy diet Stay hydrated       This is a list of the screening recommended for you and due dates:  Health Maintenance  Topic Date Due  . Tetanus Vaccine  07[birth year]29/1960  . Flu Shot  Completed  . DEXA scan (bone density measurement)  Completed  . Pneumonia vaccines  Completed

## 2017-12-23 DIAGNOSIS — M7542 Impingement syndrome of left shoulder: Secondary | ICD-10-CM | POA: Diagnosis not present

## 2017-12-23 DIAGNOSIS — M48062 Spinal stenosis, lumbar region with neurogenic claudication: Secondary | ICD-10-CM | POA: Diagnosis not present

## 2017-12-23 DIAGNOSIS — M5136 Other intervertebral disc degeneration, lumbar region: Secondary | ICD-10-CM | POA: Diagnosis not present

## 2017-12-23 DIAGNOSIS — M5416 Radiculopathy, lumbar region: Secondary | ICD-10-CM | POA: Diagnosis not present

## 2017-12-23 DIAGNOSIS — M5412 Radiculopathy, cervical region: Secondary | ICD-10-CM | POA: Diagnosis not present

## 2017-12-25 ENCOUNTER — Encounter: Payer: Self-pay | Admitting: *Deleted

## 2017-12-29 ENCOUNTER — Telehealth: Payer: Self-pay | Admitting: *Deleted

## 2017-12-29 NOTE — Telephone Encounter (Signed)
Copied from CRM [id number]. Topic: Quick Communication - Lab Results >> Dec 29, 2017 10:47 AM Oneal GroutSebastian, Jennifer S wrote: Requesting lab results, ok to leave detail message

## 2017-12-29 NOTE — Telephone Encounter (Signed)
Unread mychart message mailed to patient 

## 2017-12-29 NOTE — Telephone Encounter (Signed)
Spoke with pt and informed her of her lab results. Pt gave a verbal understanding. Pt does not use her mychart account.

## 2018-01-04 DIAGNOSIS — L299 Pruritus, unspecified: Secondary | ICD-10-CM | POA: Diagnosis not present

## 2018-01-04 DIAGNOSIS — M5136 Other intervertebral disc degeneration, lumbar region: Secondary | ICD-10-CM | POA: Diagnosis not present

## 2018-01-04 DIAGNOSIS — M542 Cervicalgia: Secondary | ICD-10-CM | POA: Diagnosis not present

## 2018-01-04 DIAGNOSIS — G250 Essential tremor: Secondary | ICD-10-CM | POA: Diagnosis not present

## 2018-01-13 DIAGNOSIS — M48062 Spinal stenosis, lumbar region with neurogenic claudication: Secondary | ICD-10-CM | POA: Diagnosis not present

## 2018-01-13 DIAGNOSIS — M5416 Radiculopathy, lumbar region: Secondary | ICD-10-CM | POA: Diagnosis not present

## 2018-01-13 DIAGNOSIS — M5136 Other intervertebral disc degeneration, lumbar region: Secondary | ICD-10-CM | POA: Diagnosis not present

## 2018-01-25 ENCOUNTER — Ambulatory Visit
Admission: RE | Admit: 2018-01-25 | Discharge: 2018-01-25 | Disposition: A | Discharge status: Home / Self Care | Payer: Medicare Other | Source: Ambulatory Visit | Attending: Internal Medicine | Admitting: Internal Medicine

## 2018-01-25 ENCOUNTER — Other Ambulatory Visit: Payer: Self-pay | Admitting: Internal Medicine

## 2018-01-25 DIAGNOSIS — Z1231 Encounter for screening mammogram for malignant neoplasm of breast: Secondary | ICD-10-CM | POA: Diagnosis not present

## 2018-01-25 DIAGNOSIS — R928 Other abnormal and inconclusive findings on diagnostic imaging of breast: Secondary | ICD-10-CM

## 2018-01-25 DIAGNOSIS — N6489 Other specified disorders of breast: Secondary | ICD-10-CM

## 2018-02-02 ENCOUNTER — Encounter: Payer: Self-pay | Admitting: Internal Medicine

## 2018-02-02 ENCOUNTER — Ambulatory Visit (INDEPENDENT_AMBULATORY_CARE_PROVIDER_SITE_OTHER): Payer: Medicare Other | Admitting: Internal Medicine

## 2018-02-02 DIAGNOSIS — L299 Pruritus, unspecified: Secondary | ICD-10-CM | POA: Diagnosis not present

## 2018-02-02 DIAGNOSIS — R928 Other abnormal and inconclusive findings on diagnostic imaging of breast: Secondary | ICD-10-CM | POA: Diagnosis not present

## 2018-02-02 DIAGNOSIS — E039 Hypothyroidism, unspecified: Secondary | ICD-10-CM

## 2018-02-02 DIAGNOSIS — G250 Essential tremor: Secondary | ICD-10-CM | POA: Diagnosis not present

## 2018-02-02 MED ORDER — LEVOTHYROXINE SODIUM 50 MCG PO TABS: 50.0000 ug | ORAL_TABLET | Freq: Every day | ORAL | 5 refills | Status: DC

## 2018-02-02 NOTE — Progress Notes (Signed)
Patient ID: Vanessa Brown, female    DOB: [birth year]  Age: 77 y.o. MRN: [id number]  The patient is here for follow up and  management of other chronic and acute problems.    Mammogram abnormal  U/s set up for 25th of month    The risk factors are reflected in the social history.  The roster of all physicians providing medical care to patient - is listed in the Snapshot section of the chart.  Activities of daily living:  The patient is 100% independent in all ADLs: dressing, toileting, feeding as well as independent mobility  Home safety : The patient has smoke detectors in the home. They wear seatbelts.  There are no firearms at home. There is no violence in the home.   There is no risks for hepatitis, STDs or HIV. There is no   history of blood transfusion. They have no travel history to infectious disease endemic areas of the world.  The patient has seen their dentist in the last six month. They have seen their eye doctor in the last year. They admit to slight hearing difficulty with regard to whispered voices and some television programs.  They have deferred audiologic testing in the last year.  They do not  have excessive sun exposure. Discussed the need for sun protection: hats, long sleeves and use of sunscreen if there is significant sun exposure.   Diet: the importance of a healthy diet is discussed. They do have a healthy diet.  The benefits of regular aerobic exercise were discussed. She walks 4 times per week ,  20 minutes.   Depression screen: there are no signs or vegative symptoms of depression- irritability, change in appetite, anhedonia, sadness/tearfullness.  Cognitive assessment: the patient manages all their financial and personal affairs and is actively engaged. They could relate day,date,year and events; recalled 2/3 objects at 3 minutes; performed clock-face test normally.  The following portions of the patient's history were reviewed and updated as appropriate:  allergies, current medications, past family history, past medical history,  past surgical history, past social history  and problem list.  Visual acuity was not assessed per patient preference since she has regular follow up with her ophthalmologist. Hearing and body mass index were assessed and reviewed.   During the course of the visit the patient was educated and counseled about appropriate screening and preventive services including : fall prevention , diabetes screening, nutrition counseling, colorectal cancer screening, and recommended immunizations.    CC: Diagnoses of Pruritus, Acquired hypothyroidism, Benign essential tremor, and Abnormal screening mammogram were pertinent to this visit.   1) fatigue:   Falls asleep if she sits down to read.  No energy,  Son died unexpectedly at 49 in August 13, 2023.  Denies depression,  Getting out of the house often,  2-3 times per  week with family and friends .  Husband died 2 years ago (this month) .  Has 2 children close by.    Financially secure.  No worries.  Has lost 6 lb since July visit but states it is due to dietary changes since Dr Willeen Cass identified food allergies   2) Rash: had allergy testing by ENT , told she had borderline  wheat , bananas and dairy allergies.  Tried taking allegra  3) lumbar radiculopathy seein g Chasnis for spinal problems ,    4) tremor:  Seeing neurology for tremor(BET)  trouble writing with right hand despite maximal dose of primidone .  Lab Results  Component Value Date  TSH 2.21 12/22/2017    History Lavora has a past medical history of Anxiety, Arthritis, Cervical back pain with evidence of disc disease (2007), Duodenal ulcer, Gastric ulcer, GERD (gastroesophageal reflux disease), Irritable bowel syndrome (IBS), Neuropathy, Screening for breast cancer, Screening for cervical cancer, and Tremor, essential.   She has a past surgical history that includes Rotator cuff repair (2004); Biopsy breast; Abdominal  hysterectomy (1967); Breast surgery; Breast biopsy (Right, 2012); and Breast biopsy (Left, 2007).   Her family history includes Dementia in her father; Diabetes in her mother and son; Parkinson's disease in her father.She reports that she quit smoking about 18 years ago. Her smoking use included cigarettes. She has never used smokeless tobacco. She reports that she drinks alcohol. She reports that she does not use drugs.  Outpatient Medications Prior to Visit  Medication Sig Dispense Refill  . Biotin 5000 MCG CAPS Take 1 capsule by mouth daily.    . cyanocobalamin 1000 MCG tablet Take 1,000 mcg by mouth daily.    Marland Kitchen estradiol (CLIMARA - DOSED IN MG/24 HR) 0.025 mg/24hr patch apply 1 patch every week 4 patch 3  . Multiple Vitamins-Minerals (MULTIVITAMIN ADULT EXTRA C PO) Take by mouth.    . primidone (MYSOLINE) 50 MG tablet Take 250 mg by mouth 2 (two) times daily.     Marland Kitchen levothyroxine (SYNTHROID, LEVOTHROID) 50 MCG tablet TAKE 1 TABLET BY MOUTH ONCE DAILY 90 tablet 0   No facility-administered medications prior to visit.     Review of Systems   Patient denies headache, fevers, malaise, unintentional weight loss, skin rash, eye pain, sinus congestion and sinus pain, sore throat, dysphagia,  hemoptysis , cough, dyspnea, wheezing, chest pain, palpitations, orthopnea, edema, abdominal pain, nausea, melena, diarrhea, constipation, flank pain, dysuria, hematuria, urinary  Frequency, nocturia, numbness, tingling, seizures,  Focal weakness, Loss of consciousness,  Tremor, insomnia, depression, anxiety, and suicidal ideation.      Objective:  BP 112/74 (BP Location: Left Arm, Patient Position: Sitting, Cuff Size: Normal)   Pulse 69   Temp 98.1 F (36.7 C) (Oral)   Resp 14   Ht 5\' 2"  (1.575 m)   Wt 126 lb 9.6 oz (57.4 kg)   SpO2 97%   BMI 23.16 kg/m   Physical Exam   General appearance: alert, cooperative and appears stated age Head: Normocephalic, without obvious abnormality,  atraumatic Eyes: conjunctivae/corneas clear. PERRL, EOM's intact. Fundi benign. Ears: normal TM's and external ear canals both ears Nose: Nares normal. Septum midline. Mucosa normal. No drainage or sinus tenderness. Throat: lips, mucosa, and tongue normal; teeth and gums normal Neck: no adenopathy, no carotid bruit, no JVD, supple, symmetrical, trachea midline and thyroid not enlarged, symmetric, no tenderness/mass/nodules Lungs: clear to auscultation bilaterally Breasts: normal appearance, no masses or tenderness Heart: regular rate and rhythm, S1, S2 normal, no murmur, click, rub or gallop Abdomen: soft, non-tender; bowel sounds normal; no masses,  no organomegaly Extremities: extremities normal, atraumatic, no cyanosis or edema Pulses: 2+ and symmetric Skin: Skin color, texture, turgor normal. No rashes or lesions Neurologic: Alert and oriented X 3, normal strength and tone. Normal symmetric reflexes. Normal coordination and gait.     Assessment & Plan:   Problem List Items Addressed This Visit    Pruritus    Recommended increasig dose of allegra to 2-3 times the normal dose for chronic urticaria       Hypothyroid    Thyroid function is WNL on current dose.  No current changes needed.   Lab  Results  Component Value Date   TSH 2.21 12/22/2017         Relevant Medications   levothyroxine (SYNTHROID, LEVOTHROID) 50 MCG tablet   Benign essential tremor    Now managed with primidone per Neurology consultation. No changes today unless fatigue does not improve        Abnormal screening mammogram    BEAST EXAM WAS NORMAL TODAY.  SHE IS SCHEDULED FOR ULTRASOUND         A total of 40 minutes was spent with patient more than half of which was spent in counseling patient on the above mentioned issues , reviewing and explaining recent labs and imaging studies done, and coordination of care.  I have changed Valera CastleAnn S. Tomich's levothyroxine. I am also having her maintain her  primidone, cyanocobalamin, Biotin, Multiple Vitamins-Minerals (MULTIVITAMIN ADULT EXTRA C PO), and estradiol.  Meds ordered this encounter  Medications  . levothyroxine (SYNTHROID, LEVOTHROID) 50 MCG tablet    Sig: Take 1 tablet (50 mcg total) by mouth daily.    Dispense:  30 tablet    Refill:  5    Medications Discontinued During This Encounter  Medication Reason  . levothyroxine (SYNTHROID, LEVOTHROID) 50 MCG tablet     Follow-up: Return in about 6 months (around 08/04/2018).   Sherlene Shamseresa L Ethaniel Garfield, MD

## 2018-02-02 NOTE — Patient Instructions (Addendum)
Your cholesterol, thyroid  liver and kidney function are normal.  You do not need any medication changes. Please plan to repeat the labs in 6 months.   For your chronic itching:  You may find that increasing the allegra gradually, up to 4 times the maximal dose,  May manage your symptoms   If your radiologist tells you that you need to have a biopsy on the right breast,  You have a CHOICE to return to Dr Bary Castilla or another SURGEON instead of the radiologist. Your last biopsy was normal on the right breast 7 YEARS AGO.    For your constipation:  The first course is to increase the fiber in your diet to 25 g daily . Fruit and vegetables are good sources,  The [employer] and Atkins protein bars , and the low carb breads   (see below)  are all heavy on  fiber,    You can also take miralax, metamucil, fibercon, or citrucel daily to supplement your fiber. These are gentle and work in 1 to 2 days to relieve constipation, and  you can also combine them daily with colace,  A stool softener .  Also,  Remember to  make 60 ounces  of water your minimum goal for water intake    Health Maintenance for Postmenopausal Women Menopause is a normal process in which your reproductive ability comes to an end. This process happens gradually over a span of months to years, usually between the ages of 31 and 43. Menopause is complete when you have missed 12 consecutive menstrual periods. It is important to talk with your health care provider about some of the most common conditions that affect postmenopausal women, such as heart disease, cancer, and bone loss (osteoporosis). Adopting a healthy lifestyle and getting preventive care can help to promote your health and wellness. Those actions can also lower your chances of developing some of these common conditions. What should I know about menopause? During menopause, you may experience a number of symptoms, such as:  Moderate-to-severe hot flashes.  Night  sweats.  Decrease in sex drive.  Mood swings.  Headaches.  Tiredness.  Irritability.  Memory problems.  Insomnia.  Choosing to treat or not to treat menopausal changes is an individual decision that you make with your health care provider. What should I know about hormone replacement therapy and supplements? Hormone therapy products are effective for treating symptoms that are associated with menopause, such as hot flashes and night sweats. Hormone replacement carries certain risks, especially as you become older. If you are thinking about using estrogen or estrogen with progestin treatments, discuss the benefits and risks with your health care provider. What should I know about heart disease and stroke? Heart disease, heart attack, and stroke become more likely as you age. This may be due, in part, to the hormonal changes that your body experiences during menopause. These can affect how your body processes dietary fats, triglycerides, and cholesterol. Heart attack and stroke are both medical emergencies. There are many things that you can do to help prevent heart disease and stroke:  Have your blood pressure checked at least every 1-2 years. High blood pressure causes heart disease and increases the risk of stroke.  If you are 39-46 years old, ask your health care provider if you should take aspirin to prevent a heart attack or a stroke.  Do not use any tobacco products, including cigarettes, chewing tobacco, or electronic cigarettes. If you need help quitting, ask your health care  provider.  It is important to eat a healthy diet and maintain a healthy weight. ? Be sure to include plenty of vegetables, fruits, low-fat dairy products, and lean protein. ? Avoid eating foods that are high in solid fats, added sugars, or salt (sodium).  Get regular exercise. This is one of the most important things that you can do for your health. ? Try to exercise for at least 150 minutes each week.  The type of exercise that you do should increase your heart rate and make you sweat. This is known as moderate-intensity exercise. ? Try to do strengthening exercises at least twice each week. Do these in addition to the moderate-intensity exercise.  Know your numbers.Ask your health care provider to check your cholesterol and your blood glucose. Continue to have your blood tested as directed by your health care provider.  What should I know about cancer screening? There are several types of cancer. Take the following steps to reduce your risk and to catch any cancer development as early as possible. Breast Cancer  Practice breast self-awareness. ? This means understanding how your breasts normally appear and feel. ? It also means doing regular breast self-exams. Let your health care provider know about any changes, no matter how small.  If you are 85 or older, have a clinician do a breast exam (clinical breast exam or CBE) every year. Depending on your age, family history, and medical history, it may be recommended that you also have a yearly breast X-ray (mammogram).  If you have a family history of breast cancer, talk with your health care provider about genetic screening.  If you are at high risk for breast cancer, talk with your health care provider about having an MRI and a mammogram every year.  Breast cancer (BRCA) gene test is recommended for women who have family members with BRCA-related cancers. Results of the assessment will determine the need for genetic counseling and BRCA1 and for BRCA2 testing. BRCA-related cancers include these types: ? Breast. This occurs in males or females. ? Ovarian. ? Tubal. This may also be called fallopian tube cancer. ? Cancer of the abdominal or pelvic lining (peritoneal cancer). ? Prostate. ? Pancreatic.  Cervical, Uterine, and Ovarian Cancer Your health care provider may recommend that you be screened regularly for cancer of the pelvic  organs. These include your ovaries, uterus, and vagina. This screening involves a pelvic exam, which includes checking for microscopic changes to the surface of your cervix (Pap test).  For women ages 21-65, health care providers may recommend a pelvic exam and a Pap test every three years. For women ages 63-65, they may recommend the Pap test and pelvic exam, combined with testing for human papilloma virus (HPV), every five years. Some types of HPV increase your risk of cervical cancer. Testing for HPV may also be done on women of any age who have unclear Pap test results.  Other health care providers may not recommend any screening for nonpregnant women who are considered low risk for pelvic cancer and have no symptoms. Ask your health care provider if a screening pelvic exam is right for you.  If you have had past treatment for cervical cancer or a condition that could lead to cancer, you need Pap tests and screening for cancer for at least 20 years after your treatment. If Pap tests have been discontinued for you, your risk factors (such as having a new sexual partner) need to be reassessed to determine if you should start  having screenings again. Some women have medical problems that increase the chance of getting cervical cancer. In these cases, your health care provider may recommend that you have screening and Pap tests more often.  If you have a family history of uterine cancer or ovarian cancer, talk with your health care provider about genetic screening.  If you have vaginal bleeding after reaching menopause, tell your health care provider.  There are currently no reliable tests available to screen for ovarian cancer.  Lung Cancer Lung cancer screening is recommended for adults 107-58 years old who are at high risk for lung cancer because of a history of smoking. A yearly low-dose CT scan of the lungs is recommended if you:  Currently smoke.  Have a history of at least 30 pack-years of  smoking and you currently smoke or have quit within the past 15 years. A pack-year is smoking an average of one pack of cigarettes per day for one year.  Yearly screening should:  Continue until it has been 15 years since you quit.  Stop if you develop a health problem that would prevent you from having lung cancer treatment.  Colorectal Cancer  This type of cancer can be detected and can often be prevented.  Routine colorectal cancer screening usually begins at age 30 and continues through age 37.  If you have risk factors for colon cancer, your health care provider may recommend that you be screened at an earlier age.  If you have a family history of colorectal cancer, talk with your health care provider about genetic screening.  Your health care provider may also recommend using home test kits to check for hidden blood in your stool.  A small camera at the end of a tube can be used to examine your colon directly (sigmoidoscopy or colonoscopy). This is done to check for the earliest forms of colorectal cancer.  Direct examination of the colon should be repeated every 5-10 years until age 24. However, if early forms of precancerous polyps or small growths are found or if you have a family history or genetic risk for colorectal cancer, you may need to be screened more often.  Skin Cancer  Check your skin from head to toe regularly.  Monitor any moles. Be sure to tell your health care provider: ? About any new moles or changes in moles, especially if there is a change in a mole's shape or color. ? If you have a mole that is larger than the size of a pencil eraser.  If any of your family members has a history of skin cancer, especially at a young age, talk with your health care provider about genetic screening.  Always use sunscreen. Apply sunscreen liberally and repeatedly throughout the day.  Whenever you are outside, protect yourself by wearing long sleeves, pants, a wide-brimmed  hat, and sunglasses.  What should I know about osteoporosis? Osteoporosis is a condition in which bone destruction happens more quickly than new bone creation. After menopause, you may be at an increased risk for osteoporosis. To help prevent osteoporosis or the bone fractures that can happen because of osteoporosis, the following is recommended:  If you are 40-42 years old, get at least 1,000 mg of calcium and at least 600 mg of vitamin D per day.  If you are older than age 62 but younger than age 55, get at least 1,200 mg of calcium and at least 600 mg of vitamin D per day.  If you are older than  age 60, get at least 1,200 mg of calcium and at least 800 mg of vitamin D per day.  Smoking and excessive alcohol intake increase the risk of osteoporosis. Eat foods that are rich in calcium and vitamin D, and do weight-bearing exercises several times each week as directed by your health care provider. What should I know about how menopause affects my mental health? Depression may occur at any age, but it is more common as you become older. Common symptoms of depression include:  Low or sad mood.  Changes in sleep patterns.  Changes in appetite or eating patterns.  Feeling an overall lack of motivation or enjoyment of activities that you previously enjoyed.  Frequent crying spells.  Talk with your health care provider if you think that you are experiencing depression. What should I know about immunizations? It is important that you get and maintain your immunizations. These include:  Tetanus, diphtheria, and pertussis (Tdap) booster vaccine.  Influenza every year before the flu season begins.  Pneumonia vaccine.  Shingles vaccine.  Your health care provider may also recommend other immunizations. This information is not intended to replace advice given to you by your health care provider. Make sure you discuss any questions you have with your health care provider. Document Released:  11/28/2005 Document Revised: 04/25/2016 Document Reviewed: 07/10/2015 Elsevier Interactive Patient Education  2018 [employer].

## 2018-02-03 DIAGNOSIS — R928 Other abnormal and inconclusive findings on diagnostic imaging of breast: Secondary | ICD-10-CM | POA: Insufficient documentation

## 2018-02-03 NOTE — Assessment & Plan Note (Signed)
Now managed with primidone per Neurology consultation. No changes today unless fatigue does not improve

## 2018-02-03 NOTE — Assessment & Plan Note (Signed)
Recommended increasig dose of allegra to 2-3 times the normal dose for chronic urticaria

## 2018-02-03 NOTE — Assessment & Plan Note (Signed)
BEAST EXAM WAS NORMAL TODAY.  SHE IS SCHEDULED FOR ULTRASOUND

## 2018-02-03 NOTE — Assessment & Plan Note (Signed)
Thyroid function is WNL on current dose.  No current changes needed.   Lab Results  Component Value Date   TSH 2.21 12/22/2017

## 2018-02-11 ENCOUNTER — Ambulatory Visit
Admission: RE | Admit: 2018-02-11 | Discharge: 2018-02-11 | Disposition: A | Discharge status: Home / Self Care | Payer: Medicare Other | Source: Ambulatory Visit | Attending: Internal Medicine | Admitting: Internal Medicine

## 2018-02-11 DIAGNOSIS — R922 Inconclusive mammogram: Secondary | ICD-10-CM | POA: Diagnosis not present

## 2018-02-11 DIAGNOSIS — N6489 Other specified disorders of breast: Secondary | ICD-10-CM | POA: Insufficient documentation

## 2018-02-11 DIAGNOSIS — R928 Other abnormal and inconclusive findings on diagnostic imaging of breast: Secondary | ICD-10-CM

## 2018-02-19 ENCOUNTER — Ambulatory Visit: Payer: Self-pay | Admitting: *Deleted

## 2018-02-19 DIAGNOSIS — H9319 Tinnitus, unspecified ear: Secondary | ICD-10-CM | POA: Diagnosis not present

## 2018-02-19 DIAGNOSIS — R42 Dizziness and giddiness: Secondary | ICD-10-CM | POA: Diagnosis not present

## 2018-02-19 DIAGNOSIS — H6122 Impacted cerumen, left ear: Secondary | ICD-10-CM | POA: Diagnosis not present

## 2018-02-19 NOTE — Telephone Encounter (Signed)
FYI

## 2018-02-19 NOTE — Telephone Encounter (Signed)
Pt reports woke at 0400 this am and experienced "room spinning" when attempting to turn in bed.  States vision blurry at that time and "ears ringing."  Pt called daughter who called EMS.  Pt reports EMS "Didn't find anything wrong; heart, B/P all ok." No new medications except took 3 OTC stool softeners last night. No diarrhea. Did have BM this AM. Reports nausea with episode, none presently. Denies dizziness presently. No SOB, CP; alert and oriented x 4. Speech clear, pt states vision clear.  Pt's daughter made an appt with Dr. Willeen Cass, ENT, for 1030 this am. Pt states she has had inner ear problems, left ear,  in the past. Pt calling to inform Dr. Darrick Huntsman of events. Daughter will drive, is staying with patient. Care advise given per protocol.  Reason for Disposition . [1] MODERATE dizziness (e.g., interferes with normal activities) AND [2] has NOT been evaluated by physician for this  (Exception: dizziness caused by heat exposure, sudden standing, or poor fluid intake)  Answer Assessment - Initial Assessment Questions 1. DESCRIPTION: "Describe your dizziness."    Room spinning while in bed, woke up at 0400 2. LIGHTHEADED: "Do you feel lightheaded?" (e.g., somewhat faint, woozy, weak upon standing)     Not presently 3. VERTIGO: "Do you feel like either you or the room is spinning or tilting?" (i.e. vertigo)     Yes at 0400 4. SEVERITY: "How bad is it?"  "Do you feel like you are going to faint?" "Can you stand and walk?"   - MILD - walking normally   - MODERATE - interferes with normal activities (e.g., work, school)    - SEVERE - unable to stand, requires support to walk, feels like passing out now.     Severe at 0400 while in bed 5. ONSET:  "When did the dizziness begin?"     0400, subsided 6. AGGRAVATING FACTORS: "Does anything make it worse?" (e.g., standing, change in head position)     no 7. HEART RATE: "Can you tell me your heart rate?" "How many beats in 15 seconds?"  (Note: not all  patients can do this)       EMS came; no cardiac issues 8. CAUSE: "What do you think is causing the dizziness?"    Could be my ear 9. RECURRENT SYMPTOM: "Have you had dizziness before?" If so, ask: "When was the last time?" "What happened that time?"     Yes, inner ear 10. OTHER SYMPTOMS: "Do you have any other symptoms?" (e.g., fever, chest pain, vomiting, diarrhea, bleeding)       Ears ringing, high pitch, now mild but low  Protocols used: DIZZINESS [facility]

## 2018-02-23 ENCOUNTER — Other Ambulatory Visit: Payer: Self-pay | Admitting: Internal Medicine

## 2018-02-26 DIAGNOSIS — R42 Dizziness and giddiness: Secondary | ICD-10-CM | POA: Diagnosis not present

## 2018-04-09 DIAGNOSIS — M5136 Other intervertebral disc degeneration, lumbar region: Secondary | ICD-10-CM | POA: Diagnosis not present

## 2018-04-09 DIAGNOSIS — M48062 Spinal stenosis, lumbar region with neurogenic claudication: Secondary | ICD-10-CM | POA: Diagnosis not present

## 2018-04-09 DIAGNOSIS — M5416 Radiculopathy, lumbar region: Secondary | ICD-10-CM | POA: Diagnosis not present

## 2018-05-17 ENCOUNTER — Other Ambulatory Visit: Payer: Self-pay | Admitting: Internal Medicine

## 2018-06-25 DIAGNOSIS — M48062 Spinal stenosis, lumbar region with neurogenic claudication: Secondary | ICD-10-CM | POA: Diagnosis not present

## 2018-06-25 DIAGNOSIS — M5030 Other cervical disc degeneration, unspecified cervical region: Secondary | ICD-10-CM | POA: Diagnosis not present

## 2018-06-25 DIAGNOSIS — M7542 Impingement syndrome of left shoulder: Secondary | ICD-10-CM | POA: Diagnosis not present

## 2018-06-25 DIAGNOSIS — M5136 Other intervertebral disc degeneration, lumbar region: Secondary | ICD-10-CM | POA: Diagnosis not present

## 2018-06-25 DIAGNOSIS — M5412 Radiculopathy, cervical region: Secondary | ICD-10-CM | POA: Diagnosis not present

## 2018-06-25 DIAGNOSIS — M5416 Radiculopathy, lumbar region: Secondary | ICD-10-CM | POA: Diagnosis not present

## 2018-07-15 ENCOUNTER — Other Ambulatory Visit: Payer: Self-pay | Admitting: Internal Medicine

## 2018-07-21 DIAGNOSIS — Z23 Encounter for immunization: Secondary | ICD-10-CM | POA: Diagnosis not present

## 2018-08-04 ENCOUNTER — Ambulatory Visit: Payer: 59 | Admitting: Internal Medicine

## 2018-08-05 ENCOUNTER — Ambulatory Visit (INDEPENDENT_AMBULATORY_CARE_PROVIDER_SITE_OTHER): Payer: Medicare Other | Admitting: Internal Medicine

## 2018-08-05 ENCOUNTER — Encounter: Payer: Self-pay | Admitting: Internal Medicine

## 2018-08-05 VITALS — BP 118/76 | HR 60 | Temp 98.0°F | Resp 14 | Ht 62.0 in | Wt 125.8 lb

## 2018-08-05 DIAGNOSIS — E538 Deficiency of other specified B group vitamins: Secondary | ICD-10-CM

## 2018-08-05 DIAGNOSIS — E785 Hyperlipidemia, unspecified: Secondary | ICD-10-CM

## 2018-08-05 DIAGNOSIS — M5090 Cervical disc disorder, unspecified, unspecified cervical region: Secondary | ICD-10-CM | POA: Diagnosis not present

## 2018-08-05 DIAGNOSIS — R0683 Snoring: Secondary | ICD-10-CM

## 2018-08-05 DIAGNOSIS — R5383 Other fatigue: Secondary | ICD-10-CM

## 2018-08-05 DIAGNOSIS — L299 Pruritus, unspecified: Secondary | ICD-10-CM | POA: Diagnosis not present

## 2018-08-05 DIAGNOSIS — N951 Menopausal and female climacteric states: Secondary | ICD-10-CM | POA: Diagnosis not present

## 2018-08-05 DIAGNOSIS — E039 Hypothyroidism, unspecified: Secondary | ICD-10-CM

## 2018-08-05 LAB — CBC WITH DIFFERENTIAL/PLATELET
BASOS ABS: 0.1 10*3/uL (ref 0.0–0.1)
Basophils Relative: 1.3 % (ref 0.0–3.0)
EOS ABS: 0.4 10*3/uL (ref 0.0–0.7)
Eosinophils Relative: 3.9 % (ref 0.0–5.0)
HCT: 39.3 % (ref 36.0–46.0)
HEMOGLOBIN: 13.2 g/dL (ref 12.0–15.0)
LYMPHS ABS: 2.6 10*3/uL (ref 0.7–4.0)
Lymphocytes Relative: 28.2 % (ref 12.0–46.0)
MCHC: 33.5 g/dL (ref 30.0–36.0)
MCV: 103.6 fl — ABNORMAL HIGH (ref 78.0–100.0)
MONO ABS: 0.6 10*3/uL (ref 0.1–1.0)
Monocytes Relative: 6.8 % (ref 3.0–12.0)
NEUTROS PCT: 59.8 % (ref 43.0–77.0)
Neutro Abs: 5.6 10*3/uL (ref 1.4–7.7)
Platelets: 412 10*3/uL — ABNORMAL HIGH (ref 150.0–400.0)
RBC: 3.79 Mil/uL — AB (ref 3.87–5.11)
RDW: 14.2 % (ref 11.5–15.5)
WBC: 9.4 10*3/uL (ref 4.0–10.5)

## 2018-08-05 LAB — LIPID PANEL
CHOLESTEROL: 270 mg/dL — AB (ref 0–200)
HDL: 84.2 mg/dL (ref >39.00)
LDL Cholesterol: 173 mg/dL — ABNORMAL HIGH (ref 0–99)
NonHDL: 185.97
Total CHOL/HDL Ratio: 3
Triglycerides: 65 mg/dL (ref 0.0–149.0)
VLDL: 13 mg/dL (ref 0.0–40.0)

## 2018-08-05 LAB — COMPREHENSIVE METABOLIC PANEL
ALT: 22 U/L (ref 0–35)
AST: 18 U/L (ref 0–37)
Albumin: 4.3 g/dL (ref 3.5–5.2)
Alkaline Phosphatase: 97 U/L (ref 39–117)
BUN: 17 mg/dL (ref 6–23)
CALCIUM: 9.9 mg/dL (ref 8.4–10.5)
CHLORIDE: 101 meq/L (ref 96–112)
CO2: 28 meq/L (ref 19–32)
Creatinine, Ser: 0.94 mg/dL (ref 0.40–1.20)
GFR: 61.21 mL/min (ref >60.00)
Glucose, Bld: 95 mg/dL (ref 70–99)
Potassium: 5.1 mEq/L (ref 3.5–5.1)
SODIUM: 138 meq/L (ref 135–145)
Total Bilirubin: 0.3 mg/dL (ref 0.2–1.2)
Total Protein: 7.5 g/dL (ref 6.0–8.3)

## 2018-08-05 LAB — TSH: TSH: 2.33 u[IU]/mL (ref 0.35–4.50)

## 2018-08-05 LAB — VITAMIN B12: VITAMIN B 12: 990 pg/mL — AB (ref 211–911)

## 2018-08-05 MED ORDER — ESTRADIOL 0.025 MG/24HR TD PTWK: MEDICATED_PATCH | TRANSDERMAL | 2 refills | Status: DC

## 2018-08-05 NOTE — Progress Notes (Signed)
Subjective:  Patient ID: Vanessa Brown, female    DOB: May 12, 1941  Age: 77 y.o. MRN: [id number]  CC: The primary encounter diagnosis was Fatigue, unspecified type. Diagnoses of B12 deficiency, Acquired hypothyroidism, Hyperlipidemia LDL goal <160, Hyperlipidemia LDL goal <130, Habitual snoring, Cervical back pain with evidence of disc disease, Pruritus, and Menopause syndrome were also pertinent to this visit.  HPI CHIANTI GOH presents for follow up .  Last seen in April  For chronic pruritis without rash.  Improved with increased dose of allegra.  Now resolved.  Wakes up with sore throat every morning and a headache for the past several weeks.  Improves with time after getting up.  Headache was initially occipital,  Now frontal/sinus .  Has clear rhinorrhea.  Had a nosebleed spontaneously recently while golfing,   Doesn't occur more than 2 times a year   Seeing chasnis for low back pain , toilet seat neuroapthy, and shoulder pain,  Has has 2 series of ESI which have helped ,  Several years in between .  Also had left shoulder blade injected with steroid.      Breast ultrasound on right  Normal.   Yr follow up   Fatigue for 5-6 months.    Rennis Harding,  ENT  May 2019  for vertigo upon awakening , could not get out of bed. EMS was called  Patient was able to walk.  Saw EN, Diagnosed with BPV. Balance was great .  Has meclizine  Used it prn   Using climara every 7 days changing patch    Outpatient Medications Prior to Visit  Medication Sig Dispense Refill  . Biotin 5000 MCG CAPS Take 1 capsule by mouth daily.    . cholecalciferol (VITAMIN D) 1000 units tablet Take 1,000 Units by mouth daily.    . cyanocobalamin 1000 MCG tablet Take 1,000 mcg by mouth daily.    Marland Kitchen levothyroxine (SYNTHROID, LEVOTHROID) 50 MCG tablet Take 1 tablet (50 mcg total) by mouth daily. 30 tablet 5  . Multiple Vitamins-Minerals (MULTIVITAMIN ADULT EXTRA C PO) Take by mouth.    . primidone (MYSOLINE) 50 MG tablet Take  250 mg by mouth 2 (two) times daily.     Marland Kitchen estradiol (CLIMARA - DOSED IN MG/24 HR) 0.025 mg/24hr patch REPLACE ONE APPLY 1 PATCH EVERY 3 DAYS TO CLEAN DRY SKIN.  REMOVE OLDPATCH ANDDISPOSE OF PROPERLY SAME DAY WEEKLY 4 patch 2  . estradiol (CLIMARA - DOSED IN MG/24 HR) 0.025 mg/24hr patch REPLACE ONE APPLY 1 PATCH EVERY 3 DAYS TO CLEAN DRY SKIN.  REMOVE OLDPATCH ANDDISPOSE OF PROPERLY SAME DAY WEEKLY 4 patch 2   No facility-administered medications prior to visit.     Review of Systems;  Patient denies headache, fevers, malaise, unintentional weight loss, skin rash, eye pain, sinus congestion and sinus pain, sore throat, dysphagia,  hemoptysis , cough, dyspnea, wheezing, chest pain, palpitations, orthopnea, edema, abdominal pain, nausea, melena, diarrhea, constipation, flank pain, dysuria, hematuria, urinary  Frequency, nocturia, numbness, tingling, seizures,  Focal weakness, Loss of consciousness,  Tremor, insomnia, depression, anxiety, and suicidal ideation.      Objective:  BP 118/76 (BP Location: Left Arm, Patient Position: Sitting, Cuff Size: Normal)   Pulse 60   Temp 98 F (36.7 C) (Oral)   Resp 14   Ht 5\' 2"  (1.575 m)   Wt 125 lb 12.8 oz (57.1 kg)   SpO2 99%   BMI 23.01 kg/m   BP Readings from Last 3 Encounters:  08/05/18  118/76  02/02/18 112/74  12/22/17 110/70    Wt Readings from Last 3 Encounters:  08/05/18 125 lb 12.8 oz (57.1 kg)  02/02/18 126 lb 9.6 oz (57.4 kg)  12/22/17 128 lb 6.4 oz (58.2 kg)    General appearance: alert, cooperative and appears stated age Ears: normal TM's and external ear canals both ears Throat: lips, mucosa, and tongue normal; teeth and gums normal Neck: no adenopathy, no carotid bruit, supple, symmetrical, trachea midline and thyroid not enlarged, symmetric, no tenderness/mass/nodules Back: symmetric, no curvature. ROM normal. No CVA tenderness. Lungs: clear to auscultation bilaterally Heart: regular rate and rhythm, S1, S2 normal, no  murmur, click, rub or gallop Abdomen: soft, non-tender; bowel sounds normal; no masses,  no organomegaly Pulses: 2+ and symmetric Skin: Skin color, texture, turgor normal. No rashes or lesions Lymph nodes: Cervical, supraclavicular, and axillary nodes normal.  No results found for: HGBA1C  Lab Results  Component Value Date   CREATININE 0.94 08/05/2018   CREATININE 0.82 12/22/2017   CREATININE 0.75 05/11/2017    Lab Results  Component Value Date   WBC 9.4 08/05/2018   HGB 13.2 08/05/2018   HCT 39.3 08/05/2018   PLT 412.0 (H) 08/05/2018   GLUCOSE 95 08/05/2018   CHOL 270 (H) 08/05/2018   TRIG 65.0 08/05/2018   HDL 84.20 08/05/2018   LDLDIRECT 137.0 12/22/2017   LDLCALC 173 (H) 08/05/2018   ALT 22 08/05/2018   AST 18 08/05/2018   NA 138 08/05/2018   K 5.1 08/05/2018   CL 101 08/05/2018   CREATININE 0.94 08/05/2018   BUN 17 08/05/2018   CO2 28 08/05/2018   TSH 2.33 08/05/2018    Mm Diag Breast Tomo Uni Right  Result Date: 02/11/2018 CLINICAL DATA:  The patient was called back for a right breast asymmetry. EXAM: DIGITAL DIAGNOSTIC UNILATERAL RIGHT MAMMOGRAM WITH CAD AND TOMO COMPARISON:  Previous exam(s). ACR Breast Density Category c: The breast tissue is heterogeneously dense, which may obscure small masses. FINDINGS: The right breast asymmetry resolves on today's imaging. Mammographic images were processed with CAD. IMPRESSION: No mammographic evidence of malignancy. RECOMMENDATION: Annual screening mammography. I have discussed the findings and recommendations with the patient. Results were also provided in writing at the conclusion of the visit. If applicable, a reminder letter will be sent to the patient regarding the next appointment. BI-RADS CATEGORY  2: Benign. Electronically Signed   By: Gerome Sam III M.D   On: 02/11/2018 14:04    Assessment & Plan:   Problem List Items Addressed This Visit    Cervical back pain with evidence of disc disease    With recurrent  mornigng headaches. Advised to use a pillow with neck support      RESOLVED: Fatigue - Primary   Relevant Orders   CBC with Differential/Platelet (Completed)   Comprehensive metabolic panel (Completed)   Habitual snoring    Advised to try using a chin strap to keep mouth closed      Hyperlipidemia LDL goal <130    She did not tolerate low dose crestor.  continue red yeast rice.  LDL < 160  Lab Results  Component Value Date   CHOL 270 (H) 08/05/2018   HDL 84.20 08/05/2018   LDLCALC 173 (H) 08/05/2018   LDLDIRECT 137.0 12/22/2017   TRIG 65.0 08/05/2018   CHOLHDL 3 08/05/2018   Lab Results  Component Value Date   ALT 22 08/05/2018   AST 18 08/05/2018   ALKPHOS 97 08/05/2018   BILITOT 0.3  08/05/2018         Hypothyroid    Thyroid function is WNL on current dose.  No current changes needed.   Lab Results  Component Value Date   TSH 2.33 08/05/2018         Relevant Orders   TSH (Completed)   Menopause syndrome    Managed with estradiol patch changed  Weekly       Pruritus    Improved with increased use of antihistamine,  Now resolved.        Other Visit Diagnoses    B12 deficiency       Relevant Orders   Vitamin B12 (Completed)   Hyperlipidemia LDL goal <160       Relevant Orders   Lipid panel (Completed)     A total of 25 minutes of face to face time was spent with patient more than half of which was spent in counselling about the above mentioned conditions  and coordination of care  I have discontinued [place]. Connors's estradiol. I have also changed her estradiol. Additionally, I am having her maintain her primidone, cyanocobalamin, Biotin, Multiple Vitamins-Minerals (MULTIVITAMIN ADULT EXTRA C PO), levothyroxine, and cholecalciferol.  Meds ordered this encounter  Medications  . estradiol (CLIMARA - DOSED IN MG/24 HR) 0.025 mg/24hr patch    Sig: REPLACE ONE APPLY 1 PATCH weekly to  CLEAN DRY SKIN.  REMOVE OLDPATCH AND DISPOSE OF PROPERLY SAME DAY WEEKLY      Dispense:  4 patch    Refill:  2    Medications Discontinued During This Encounter  Medication Reason  . estradiol (CLIMARA - DOSED IN MG/24 HR) 0.025 mg/24hr patch Reorder  . estradiol (CLIMARA - DOSED IN MG/24 HR) 0.025 mg/24hr patch     Follow-up: Return in about 6 months (around 02/04/2019) for CPE.   Sherlene Shams, MD

## 2018-08-05 NOTE — Patient Instructions (Addendum)
  Your morning sore throat and fatigue may be due to mouth breathing (and snoring) at night.    I recommend a trial of  using  lycra chin strap ( to cure snoring)  And  Restarting allegra on a daily basis 180 mg   To see if your drippy nose and sore throat improve   Your headache is not from a sinus infection,  But it could be coming from:  - your neck arthritis - sleep apnea -sinus pressure/congestion occurring at night   try changing your pillow to one with more support

## 2018-08-07 DIAGNOSIS — R0683 Snoring: Secondary | ICD-10-CM | POA: Insufficient documentation

## 2018-08-07 NOTE — Assessment & Plan Note (Signed)
Managed with estradiol patch changed  Weekly

## 2018-08-07 NOTE — Assessment & Plan Note (Signed)
Thyroid function is WNL on current dose.  No current changes needed.   Lab Results  Component Value Date   TSH 2.33 08/05/2018

## 2018-08-07 NOTE — Assessment & Plan Note (Signed)
Improved with increased use of antihistamine,  Now resolved.

## 2018-08-07 NOTE — Assessment & Plan Note (Signed)
Advised to try using a chin strap to keep mouth closed

## 2018-08-07 NOTE — Assessment & Plan Note (Addendum)
With recurrent mornigng headaches. Advised to use a pillow with neck support

## 2018-08-07 NOTE — Assessment & Plan Note (Signed)
She did not tolerate low dose crestor.  continue red yeast rice.  LDL < 160  Lab Results  Component Value Date   CHOL 270 (H) 08/05/2018   HDL 84.20 08/05/2018   LDLCALC 173 (H) 08/05/2018   LDLDIRECT 137.0 12/22/2017   TRIG 65.0 08/05/2018   CHOLHDL 3 08/05/2018   Lab Results  Component Value Date   ALT 22 08/05/2018   AST 18 08/05/2018   ALKPHOS 97 08/05/2018   BILITOT 0.3 08/05/2018

## 2018-08-09 ENCOUNTER — Telehealth: Payer: Self-pay

## 2018-08-09 NOTE — Telephone Encounter (Signed)
Copied from CRM [id number]. Topic: Quick Communication - See Telephone Encounter >> Aug 09, 2018  1:05 PM Vanessa Brown wrote: Pt called and stated tgat she would like labs from 08/05/18. Please advise

## 2018-08-09 NOTE — Telephone Encounter (Signed)
please read her  My mychart message

## 2018-08-09 NOTE — Telephone Encounter (Signed)
Patient was informed of results.  Patient understood and no questions, comments, or concerns at this time.  

## 2018-08-18 DIAGNOSIS — Z23 Encounter for immunization: Secondary | ICD-10-CM | POA: Diagnosis not present

## 2018-09-13 DIAGNOSIS — M542 Cervicalgia: Secondary | ICD-10-CM | POA: Diagnosis not present

## 2018-09-13 DIAGNOSIS — G250 Essential tremor: Secondary | ICD-10-CM | POA: Diagnosis not present

## 2018-09-13 DIAGNOSIS — M5136 Other intervertebral disc degeneration, lumbar region: Secondary | ICD-10-CM | POA: Diagnosis not present

## 2018-09-29 DIAGNOSIS — H9209 Otalgia, unspecified ear: Secondary | ICD-10-CM | POA: Diagnosis not present

## 2018-09-29 DIAGNOSIS — H6123 Impacted cerumen, bilateral: Secondary | ICD-10-CM | POA: Diagnosis not present

## 2018-09-29 DIAGNOSIS — R040 Epistaxis: Secondary | ICD-10-CM | POA: Diagnosis not present

## 2018-12-07 ENCOUNTER — Telehealth: Payer: Self-pay | Admitting: Internal Medicine

## 2018-12-07 NOTE — Telephone Encounter (Signed)
Called [facility] back and informed the pharmacist that according to her chart since 2015 the rx has been sent in as change once weekly. Pharmacist gave a verbal understanding.

## 2018-12-07 NOTE — Telephone Encounter (Signed)
Copied from CRM [id number]. Topic: General - Other >> Dec 07, 2018 11:18 AM Leafy Ro wrote: Sam pharmacist is calling and would like to know estradiol patches change once a wk or twice a wk.

## 2018-12-24 ENCOUNTER — Ambulatory Visit (INDEPENDENT_AMBULATORY_CARE_PROVIDER_SITE_OTHER): Payer: Medicare Other

## 2018-12-24 VITALS — BP 118/70 | HR 58 | Temp 97.4°F | Resp 16 | Ht 61.0 in | Wt 125.0 lb

## 2018-12-24 DIAGNOSIS — Z0000 Encounter for general adult medical examination without abnormal findings: Secondary | ICD-10-CM

## 2018-12-24 DIAGNOSIS — E039 Hypothyroidism, unspecified: Secondary | ICD-10-CM | POA: Diagnosis not present

## 2018-12-24 LAB — TSH: TSH: 3.27 u[IU]/mL (ref 0.35–4.50)

## 2018-12-24 NOTE — Patient Instructions (Addendum)
  Vanessa Brown , Thank you for taking time to come for your Medicare Wellness Visit. I appreciate your ongoing commitment to your health goals. Please review the following plan we discussed and let me know if I can assist you in the future.   Follow up as needed.    Bring a copy of your Health Care Power of Attorney and/or Living Will to be scanned into chart.  Have a great day!  These are the goals we discussed: Goals      Patient Stated   . Maintain Healthy Lifestyle (pt-stated)     Stay active Stay hydrated  Healthy diet       This is a list of the screening recommended for you and due dates:  Health Maintenance  Topic Date Due  . Tetanus Vaccine  07/29/1960  . Flu Shot  Completed  . DEXA scan (bone density measurement)  Completed  . Pneumonia vaccines  Completed

## 2018-12-24 NOTE — Progress Notes (Addendum)
Subjective:   Vanessa Brown is a 78 y.o. female who presents for Medicare Annual (Subsequent) preventive examination.  Review of Systems:  No ROS.  Medicare Wellness Visit. Additional risk factors are reflected in the social history. Cardiac Risk Factors include: advanced age (>72men, >19 women)     Objective:     Vitals: BP 118/70 (BP Location: Left Arm, Patient Position: Sitting, Cuff Size: Normal)   Pulse (!) 58   Temp (!) 97.4 F (36.3 C) (Oral)   Resp 16   Ht 5\' 1"  (1.549 m)   Wt 125 lb (56.7 kg)   SpO2 97%   BMI 23.62 kg/m   Body mass index is 23.62 kg/m.  Advanced Directives 12/24/2018 12/22/2017 12/15/2016  Does Patient Have a Medical Advance Directive? Yes Yes Yes  Type of Estate agent of [place];Living will Healthcare Power of [place];Living will Healthcare Power of [place];Living will  Does patient want to make changes to medical advance directive? No - Patient declined No - Patient declined No - Patient declined  Copy of Healthcare Power of Attorney in Chart? No - copy requested No - copy requested No - copy requested    Tobacco Social History   Tobacco Use  Smoking Status Former Smoker  . Types: Cigarettes  . Last attempt to quit: 09/03/1999  . Years since quitting: 19.3  Smokeless Tobacco Never Used     Counseling given: Not Answered   Clinical Intake:  Pre-visit preparation completed: Yes  Pain : No/denies pain     Diabetes: No  How often do you need to have someone help you when you read instructions, pamphlets, or other written materials from your doctor or pharmacy?: 1 - Never  Interpreter Needed?: No     Past Medical History:  Diagnosis Date  . Anxiety   . Arthritis    spine, prior Pain Clinic patient  . Cervical back pain with evidence of disc disease 2007   prior epidural steorid injection s  . Duodenal ulcer   . Gastric ulcer    H Pylori positive  . GERD (gastroesophageal reflux disease)   . Irritable  bowel syndrome (IBS)    Dorena Cookey, [facility]  . Neuropathy    "toilet seat", Sherryll Burger), caused  by sitting on commode  . Screening for breast cancer   . Screening for cervical cancer   . Tremor, essential    treated with primidone 25 mg qhs   Past Surgical History:  Procedure Laterality Date  . ABDOMINAL HYSTERECTOMY  1967  . BIOPSY BREAST     benign,. right breast ([facility])  . BREAST BIOPSY Right 2012   NEG  . BREAST BIOPSY Left 2007   NEG  . BREAST SURGERY     [facility], benign biopsies  . ROTATOR CUFF REPAIR  2004   right shoulder , [place]   Family History  Problem Relation Age of Onset  . Diabetes Mother   . Parkinson's disease Father   . Dementia Father   . Diabetes Son   . Heart block Sister   . Breast cancer Neg Hx    Social History   Socioeconomic History  . Marital status: Married    Spouse name: Not on file  . Number of children: Not on file  . Years of education: Not on file  . Highest education level: Not on file  Occupational History  . Not on file  Social Needs  . Financial resource strain: Not hard at all  . Food  insecurity:    Worry: Never true    Inability: Never true  . Transportation needs:    Medical: No    Non-medical: No  Tobacco Use  . Smoking status: Former Smoker    Types: Cigarettes    Last attempt to quit: 09/03/1999    Years since quitting: 19.3  . Smokeless tobacco: Never Used  Substance and Sexual Activity  . Alcohol use: Yes    Alcohol/week: 0.0 standard drinks    Comment: OCC  . Drug use: No  . Sexual activity: Never  Lifestyle  . Physical activity:    Days per week: 5 days    Minutes per session: 30 min  . Stress: Not at all  Relationships  . Social connections:    Talks on phone: Not on file    Gets together: Not on file    Attends religious service: Not on file    Active member of club or organization: Not on file    Attends meetings of clubs or organizations: Not on file    Relationship status: Not on file    Other Topics Concern  . Not on file  Social History Narrative  . Not on file    Outpatient Encounter Medications as of 12/24/2018  Medication Sig  . Biotin 5000 MCG CAPS Take 1 capsule by mouth daily.  . cholecalciferol (VITAMIN D) 1000 units tablet Take 1,000 Units by mouth daily.  . cyanocobalamin 1000 MCG tablet Take 1,000 mcg by mouth daily.  Marland Kitchen estradiol (CLIMARA - DOSED IN MG/24 HR) 0.025 mg/24hr patch REPLACE ONE APPLY 1 PATCH weekly to  CLEAN DRY SKIN.  REMOVE OLDPATCH AND DISPOSE OF PROPERLY SAME DAY WEEKLY  . levothyroxine (SYNTHROID, LEVOTHROID) 50 MCG tablet Take 1 tablet (50 mcg total) by mouth daily.  . Multiple Vitamins-Minerals (MULTIVITAMIN ADULT EXTRA C PO) Take by mouth.  . primidone (MYSOLINE) 50 MG tablet Take 250 mg by mouth 2 (two) times daily.   . propranolol (INDERAL) 20 MG tablet Take 20 mg by mouth 2 (two) times daily.    No facility-administered encounter medications on file as of 12/24/2018.     Activities of Daily Living In your present state of health, do you have any difficulty performing the following activities: 12/24/2018  Hearing? N  Vision? N  Difficulty concentrating or making decisions? N  Walking or climbing stairs? N  Dressing or bathing? N  Doing errands, shopping? N  Preparing Food and eating ? N  Using the Toilet? N  In the past six months, have you accidently leaked urine? N  Do you have problems with loss of bowel control? N  Managing your Medications? N  Managing your Finances? N  Housekeeping or managing your Housekeeping? N  Some recent data might be hidden    Patient Care Team: Sherlene Shams, MD as PCP - General (Internal Medicine)    Assessment:   This is a routine wellness examination for Vanessa Brown.  TSH lab drawn today per her request. Agreed to possible costs with insurance; last drawn 07/2018.  Confirmed all with clinic RN. Taking all medications as directed.   Medication change- propanolol 20mg  BID.  Keep all routine  maintenance appointments. Next scheduled with pcp 02/16/19.  Health Screenings  Mammogram -02/06/18 Colonoscopy -02/27/14 Bone Density -01/14/16 Glaucoma -none Hearing  -demonstrates normal hearing during conversation TSH- 08/05/18 (2.33) Cholesterol -08/05/18  Social  Alcohol intake -yes, rare Smoking history- never Smokers in home? none Illicit drug use? none Exercise -dancing, walking 5 days  weekly, 30 min Diet -regular Sexually Active -not currently  Safety  Patient feels safe at home.  Patient does have smoke detectors at home  Patient does wear sunscreen or protective clothing when in direct sunlight  Patient does wear seat belt when driving or riding with others.   Activities of Daily Living Patient can do their own household chores. Denies needing assistance with: driving, feeding themselves, getting from bed to chair, getting to the toilet, bathing/showering, dressing, managing money, climbing flight of stairs, or preparing meals.   Depression Screen Patient denies losing interest in daily life, feeling hopeless, or crying easily over simple problems.   Fall Screen Patient denies being afraid of falling or falling in the last year.   Memory Screen Patient denies problems with memory, misplacing items, and is able to balance checkbook/bank accounts.  Patient is alert, normal appearance, oriented to person/place/and time. Correctly identified the president of the [place], recall of 3/3 objects, and performing simple calculations.  Patient displays appropriate judgement and can read correct time from watch face.   Immunizations The following Immunizations are up to date: Influenza, shingles, pneumonia. TDAP discussed.    Other Providers Patient Care Team: Sherlene Shams, MD as PCP - General (Internal Medicine)  Exercise Activities and Dietary recommendations Current Exercise Habits: Structured exercise class, Type of exercise: walking(dancing), Time (Minutes): 30,  Frequency (Times/Week): 5, Weekly Exercise (Minutes/Week): 150, Intensity: Mild  Goals      Patient Stated   . Maintain Healthy Lifestyle (pt-stated)     Stay active Stay hydrated  Healthy diet       Fall Risk Fall Risk  12/24/2018 12/22/2017 12/15/2016 12/14/2015 11/22/2014  Falls in the past year? 0 Yes Yes No No  Number falls in past yr: - - 1 - -  Injury with Fall? - - No - -    Depression Screen PHQ 2/9 Scores 12/24/2018 12/22/2017 12/15/2016 12/14/2015  PHQ - 2 Score 0 0 0 0     Cognitive Function     6CIT Screen 12/24/2018 12/22/2017 12/15/2016  What Year? 0 points 0 points 0 points  What month? 0 points 0 points 0 points  What time? 0 points 0 points 0 points  Count back from 20 0 points 0 points 0 points  Months in reverse 0 points 0 points 0 points  Repeat phrase 0 points 0 points 0 points  Total Score 0 0 0    Immunization History  Administered Date(s) Administered  . Influenza Split 08/25/2011, 07/24/2014  . Influenza-Unspecified 08/03/2012, 07/25/2015, 08/05/2016, 08/03/2017, 12/22/2017, 07/22/2018  . Pneumococcal Conjugate-13 11/22/2014  . Pneumococcal Polysaccharide-23 11/21/2006   Screening Tests Health Maintenance  Topic Date Due  . TETANUS/TDAP  07/29/1960  . INFLUENZA VACCINE  Completed  . DEXA SCAN  Completed  . PNA vac Low Risk Adult  Completed      Plan:    End of life planning; Advance aging; Advanced directives discussed. Copy of current HCPOA/Living Will requested.    I have personally reviewed and noted the following in the patient's chart:   . Medical and social history . Use of alcohol, tobacco or illicit drugs  . Current medications and supplements . Functional ability and status . Nutritional status . Physical activity . Advanced directives . List of other physicians . Hospitalizations, surgeries, and ER visits in previous 12 months . Vitals . Screenings to include cognitive, depression, and falls . Referrals and appointments  In  addition, I have reviewed and discussed with patient certain  preventive protocols, quality metrics, and best practice recommendations. A written personalized care plan for preventive services as well as general preventive health recommendations were provided to patient.     OBrien-Blaney, Dezmond Downie L, LPN  4/0/9811   I have reviewed the above information and agree with above.   Duncan Dull, MD

## 2018-12-31 ENCOUNTER — Telehealth: Payer: Self-pay | Admitting: Internal Medicine

## 2018-12-31 NOTE — Telephone Encounter (Signed)
Call placed to pt. To give lab results.  Reviewed result note from Dr. Darrick Huntsman on 12/24/18.  Pt. Verb. Understanding; agrees with plan.

## 2018-12-31 NOTE — Telephone Encounter (Signed)
Copied from CRM [id number]. Topic: Quick Communication - See Telephone Encounter >> Dec 31, 2018 10:45 AM Angela Nevin wrote: CRM for notification. See Telephone encounter for: 12/31/18.  Patient is requesting lab results from 3/6. She is unable to access them through [place].

## 2019-01-08 ENCOUNTER — Other Ambulatory Visit: Payer: Self-pay | Admitting: Internal Medicine

## 2019-02-01 ENCOUNTER — Other Ambulatory Visit: Payer: Self-pay | Admitting: Internal Medicine

## 2019-02-09 IMAGING — MG MM DIGITAL SCREENING BILAT W/ CAD
4 series · 4 of 4 positions shown · non-contrast
Comparison: Previous exam(s).

CLINICAL DATA: Screening.

EXAM:
DIGITAL SCREENING BILATERAL MAMMOGRAM WITH CAD

[L CC]
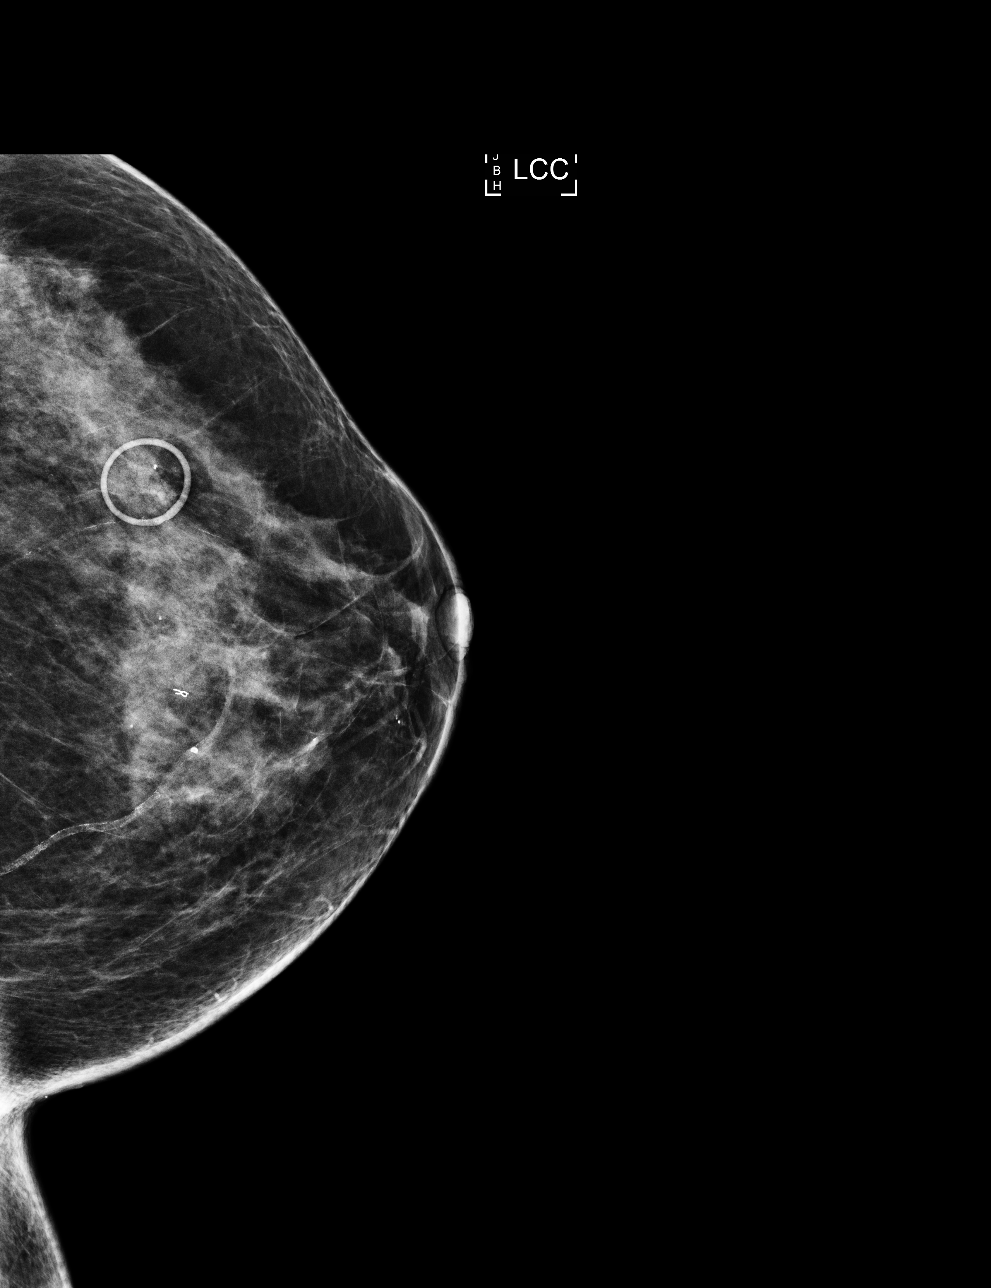

[R MLO]
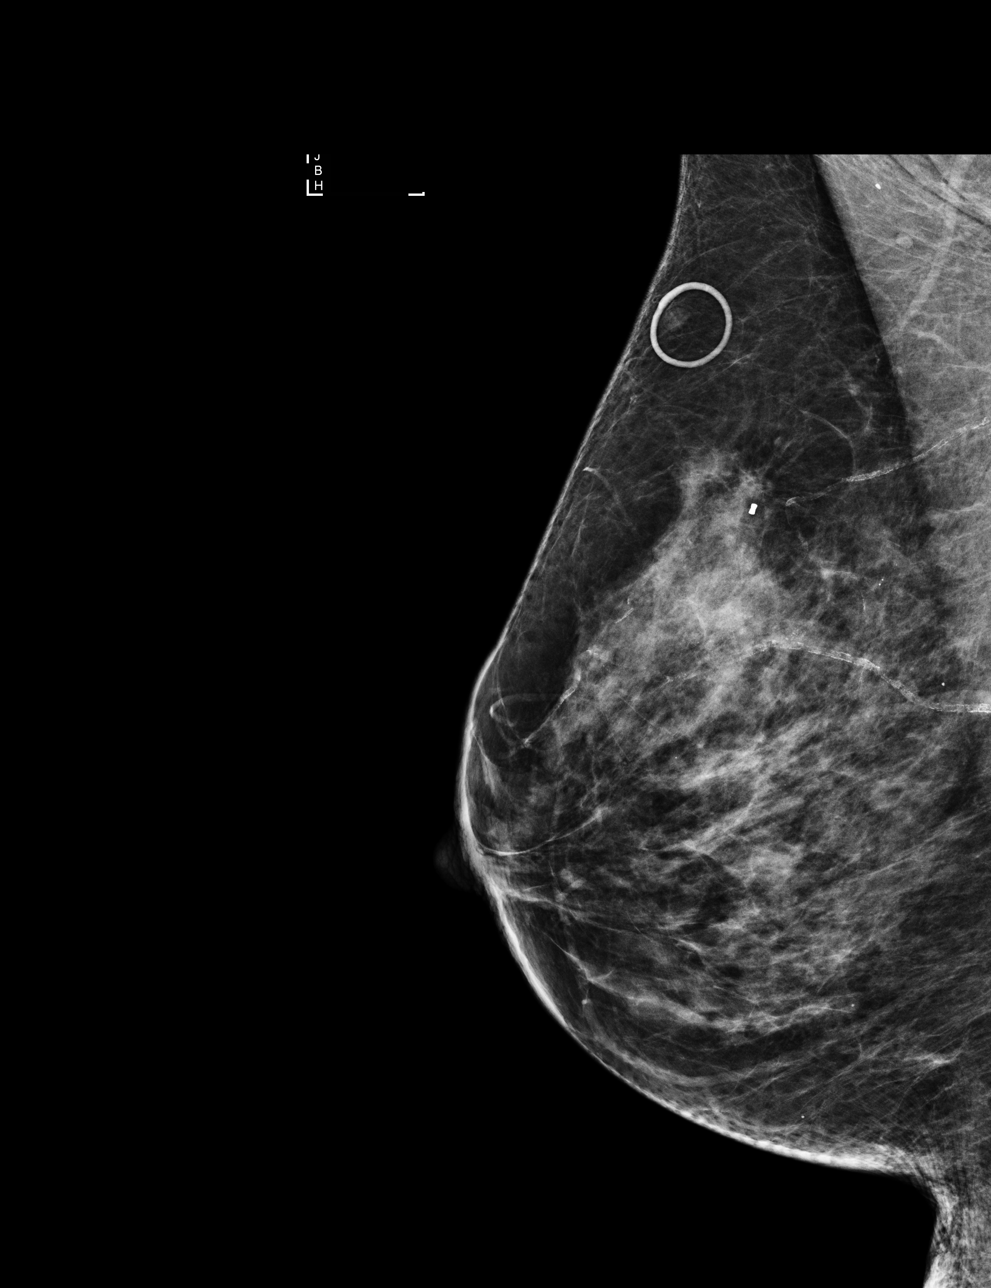

[L MLO]
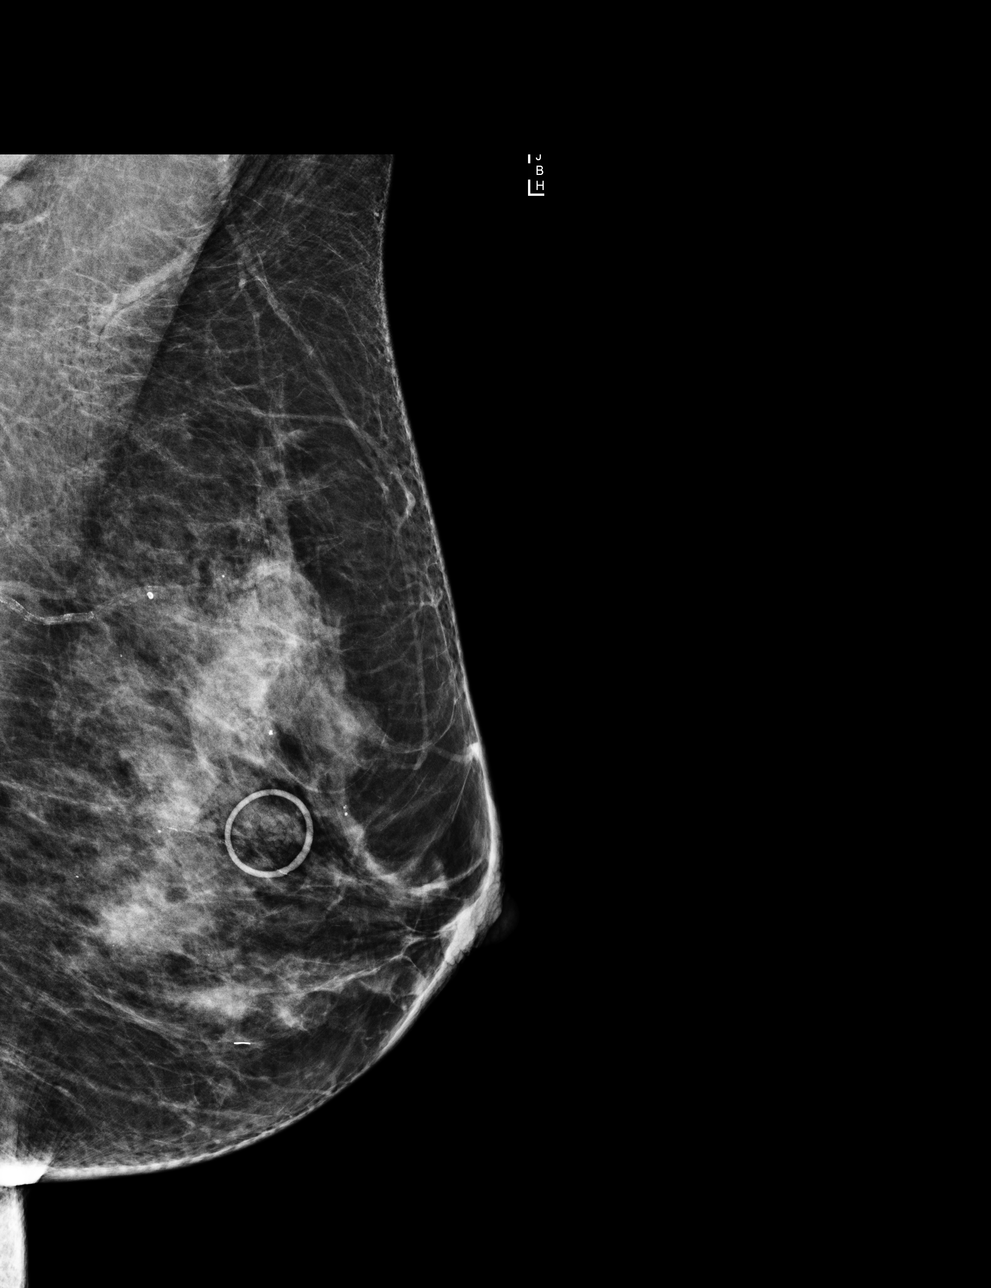

[R CC]
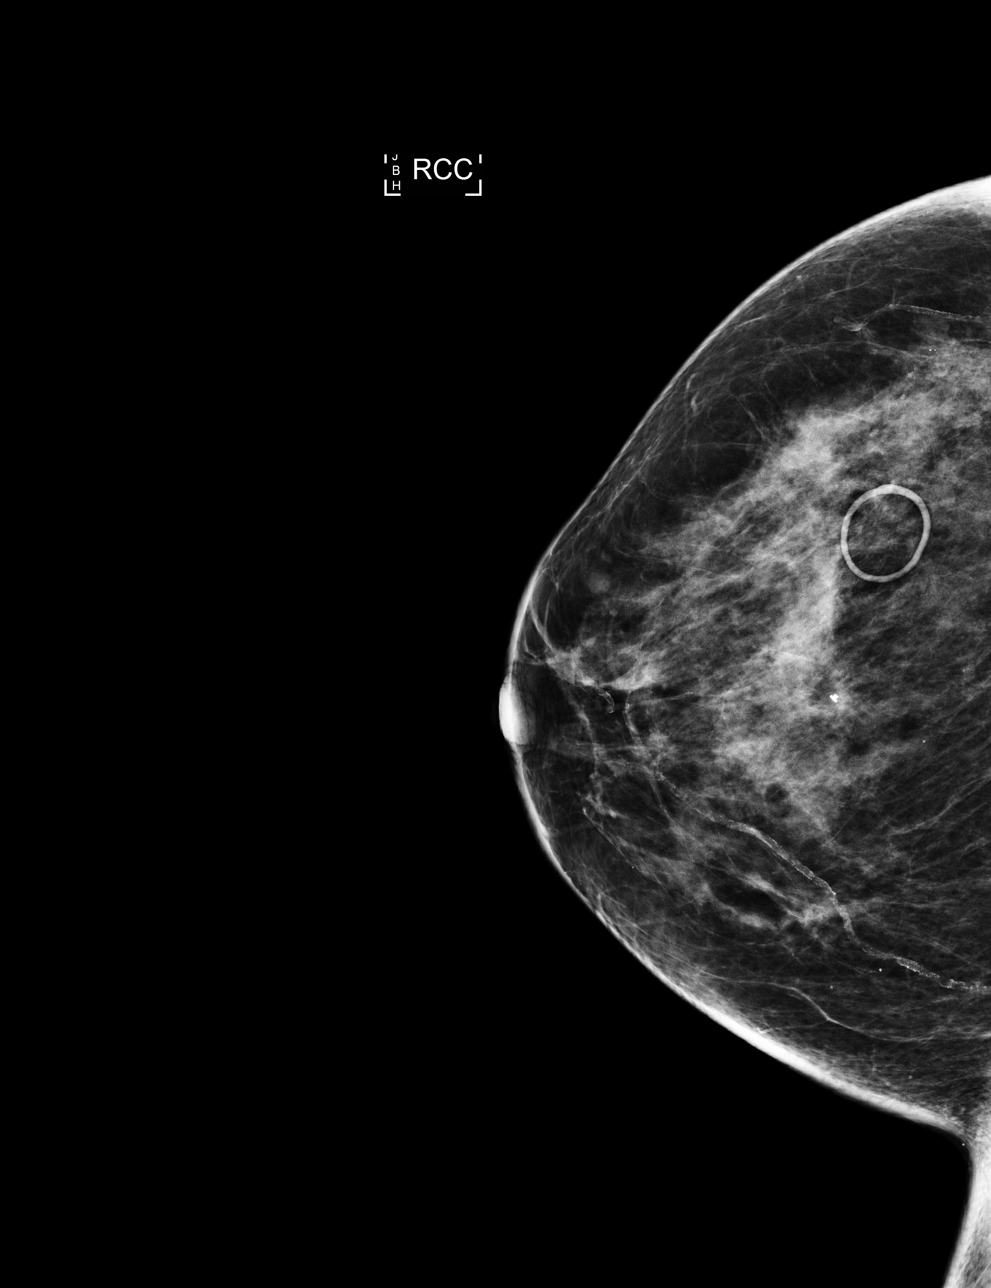

[4 of 4 positions shown; findings below may reference images not displayed]

ACR Breast Density Category c: The breast tissue is heterogeneously
dense, which may obscure small masses.
FINDINGS: In the right breast, a possible asymmetry warrants further
evaluation. In the left breast, no findings suspicious for
malignancy. Images were processed with CAD.
IMPRESSION: Further evaluation is suggested for possible asymmetry in the right
breast.

RECOMMENDATION:
Diagnostic mammogram and possibly ultrasound of the right breast.
(Code:[id number])

The patient will be contacted regarding the findings, and additional
imaging will be scheduled.

BI-RADS CATEGORY  0: Incomplete. Need additional imaging evaluation
and/or prior mammograms for comparison.

## 2019-02-16 ENCOUNTER — Ambulatory Visit: Payer: Medicare Other | Admitting: Internal Medicine

## 2019-03-01 DIAGNOSIS — M5416 Radiculopathy, lumbar region: Secondary | ICD-10-CM | POA: Diagnosis not present

## 2019-03-01 DIAGNOSIS — M5136 Other intervertebral disc degeneration, lumbar region: Secondary | ICD-10-CM | POA: Diagnosis not present

## 2019-03-18 ENCOUNTER — Other Ambulatory Visit: Payer: Self-pay

## 2019-03-21 ENCOUNTER — Ambulatory Visit: Payer: Medicare Other | Admitting: Internal Medicine

## 2019-03-25 ENCOUNTER — Ambulatory Visit: Payer: Medicare Other | Admitting: Internal Medicine

## 2019-03-28 ENCOUNTER — Other Ambulatory Visit: Payer: Self-pay | Admitting: Internal Medicine

## 2019-04-18 DIAGNOSIS — G250 Essential tremor: Secondary | ICD-10-CM | POA: Diagnosis not present

## 2019-05-02 ENCOUNTER — Other Ambulatory Visit: Payer: Self-pay

## 2019-05-03 ENCOUNTER — Encounter: Payer: Self-pay | Admitting: Internal Medicine

## 2019-05-03 ENCOUNTER — Ambulatory Visit (INDEPENDENT_AMBULATORY_CARE_PROVIDER_SITE_OTHER): Payer: Medicare Other | Admitting: Internal Medicine

## 2019-05-03 ENCOUNTER — Other Ambulatory Visit: Payer: Self-pay

## 2019-05-03 DIAGNOSIS — E039 Hypothyroidism, unspecified: Secondary | ICD-10-CM | POA: Diagnosis not present

## 2019-05-03 DIAGNOSIS — E785 Hyperlipidemia, unspecified: Secondary | ICD-10-CM | POA: Diagnosis not present

## 2019-05-03 DIAGNOSIS — G250 Essential tremor: Secondary | ICD-10-CM | POA: Diagnosis not present

## 2019-05-03 DIAGNOSIS — R21 Rash and other nonspecific skin eruption: Secondary | ICD-10-CM | POA: Diagnosis not present

## 2019-05-03 DIAGNOSIS — R0683 Snoring: Secondary | ICD-10-CM

## 2019-05-03 NOTE — Progress Notes (Signed)
Telephone  Note  This visit type was conducted due to national recommendations for restrictions regarding the COVID-19 pandemic (e.g. social distancing).  This format is felt to be most appropriate for this patient at this time.  All issues noted in this document were discussed and addressed.  No physical exam was performed (except for noted visual exam findings with Video Visits).   I connected with@ on 05/03/19 at  2:30 PM EDT by  telephone and verified that I am speaking with the correct person using two identifiers. Location patient: home Location provider: work or home office Persons participating in the virtual visit: patient, provider  I discussed the limitations, risks, security and privacy concerns of performing an evaluation and management service by telephone and the availability of in person appointments. I also discussed with the patient that there may be a patient responsible charge related to this service. The patient expressed understanding and agreed to proceed.  Reason for visit: physical exam postponed due to scratchy throat   HPI:  78 yr old female with hypothyroidism presented for her annual preventive exam but patient reported symptom of Scratchy throat to the front office COVID screening so CPE was changed to virtual visit .    Patient states that the throat has been sore for months  and is worse in the morning . The symptoms are not occurring daily . Wonders if she sleeps with her mouth open since worse in the morning.  Has had drippy nose off and on but no sneezing    Tested for allergies by Dr Richardson Landry during workup for Chronic urticarial rash  In Sept 2018 .workup was negative per patient     Rash resolved with change in detergents ,  Moisturizers,  Media planner. And eating habits     BET:  Her neurologist has increased her propranolol to 20 mg twice daily, but she is concerned about its effect on her blood pressure.  home readings have been 116/62 and she  occasionally feels dizzy with sudden changes in position . However, she also has a history of vertigo    ROS: See pertinent positives and negatives per HPI.  Past Medical History:  Diagnosis Date  . Anxiety   . Arthritis    spine, prior Pain Clinic patient  . Cervical back pain with evidence of disc disease 2007   prior epidural steorid injection s  . Duodenal ulcer   . Gastric ulcer    H Pylori positive  . GERD (gastroesophageal reflux disease)   . Irritable bowel syndrome (IBS)    Teena Irani, [facility]  . Neuropathy    "toilet seat", Manuella Ghazi), caused  by sitting on commode  . Screening for breast cancer   . Screening for cervical cancer   . Tremor, essential    treated with primidone 25 mg qhs    Past Surgical History:  Procedure Laterality Date  . ABDOMINAL HYSTERECTOMY  1967  . BIOPSY BREAST     benign,. right breast ([facility])  . BREAST BIOPSY Right 2012   NEG  . BREAST BIOPSY Left 2007   NEG  . BREAST SURGERY     [facility], benign biopsies  . ROTATOR CUFF REPAIR  2004   right shoulder , [place]    Family History  Problem Relation Age of Onset  . Diabetes Mother   . Parkinson's disease Father   . Dementia Father   . Diabetes Son   . Heart block Sister   . Breast cancer Neg  Hx     SOCIAL HX:  reports that she quit smoking about 19 years ago. Her smoking use included cigarettes. She has never used smokeless tobacco. She reports current alcohol use. She reports that she does not use drugs.   Current Outpatient Medications:  .  Biotin 5000 MCG CAPS, Take 1 capsule by mouth daily., Disp: , Rfl:  .  cholecalciferol (VITAMIN D) 1000 units tablet, Take 1,000 Units by mouth daily., Disp: , Rfl:  .  cyanocobalamin 1000 MCG tablet, Take 1,000 mcg by mouth daily., Disp: , Rfl:  .  estradiol (CLIMARA - DOSED IN MG/24 HR) 0.025 mg/24hr patch, APPLY 1 .PATCH. TOPICALLY ONCE A WEEK, Disp: 4 patch, Rfl: 1 .  levothyroxine (SYNTHROID, LEVOTHROID) 50 MCG tablet, TAKE 1 TABLET  BY MOUTH ONCE DAILY, Disp: 90 tablet, Rfl: 1 .  Multiple Vitamins-Minerals (MULTIVITAMIN ADULT EXTRA C PO), Take by mouth., Disp: , Rfl:  .  primidone (MYSOLINE) 50 MG tablet, Take 250 mg by mouth 2 (two) times daily. , Disp: , Rfl:  .  propranolol (INDERAL) 20 MG tablet, Take 20 mg by mouth 2 (two) times daily. , Disp: , Rfl:   EXAM:   General impression: alert, cooperative and articulate.  No signs of being in distress  Lungs: speech is fluent sentence length suggests that patient is not short of breath and not punctuated by cough, sneezing or sniffing. Marland Kitchen.   Psych: affect normal.  speech is articulate and non pressured .  Denies suicidal thoughts    ASSESSMENT AND PLAN:  Rash and nonspecific skin eruption Resolved with changes in eating habits and detergent. Allergy testing was reportedly negative.   Benign essential tremor Now managed with inderal  per Neurology consultation. No changes unless she is orthostatic on exam next week     Hypothyroid Patient is advised to suspend her biotin for several days prior to her next TSH  Habitual snoring Advised to purchase a chin strap to prevent dry throat   Hyperlipidemia LDL goal <130 She did not tolerate low dose crestor.  She has stopped taking red yeast rice as well.  Fasting lipids ordered Lab Results  Component Value Date   CHOL 270 (H) 08/05/2018   HDL 84.20 08/05/2018   LDLCALC 173 (H) 08/05/2018   LDLDIRECT 137.0 12/22/2017   TRIG 65.0 08/05/2018   CHOLHDL 3 08/05/2018   Lab Results  Component Value Date   ALT 22 08/05/2018   AST 18 08/05/2018   ALKPHOS 97 08/05/2018   BILITOT 0.3 08/05/2018       I discussed the assessment and treatment plan with the patient. The patient was provided an opportunity to ask questions and all were answered. The patient agreed with the plan and demonstrated an understanding of the instructions.   The patient was advised to call back or seek an in-person evaluation if the symptoms  worsen or if the condition fails to improve as anticipated.  I provided 25  minutes of non-face-to-face time during this encounter.   Sherlene Shamseresa L Yaneisy Wenz, MD

## 2019-05-04 NOTE — Assessment & Plan Note (Signed)
Advised to purchase a chin strap to prevent dry throat  

## 2019-05-04 NOTE — Assessment & Plan Note (Signed)
She did not tolerate low dose crestor.  She has stopped taking red yeast rice as well.  Fasting lipids ordered Lab Results  Component Value Date   CHOL 270 (H) 08/05/2018   HDL 84.20 08/05/2018   LDLCALC 173 (H) 08/05/2018   LDLDIRECT 137.0 12/22/2017   TRIG 65.0 08/05/2018   CHOLHDL 3 08/05/2018   Lab Results  Component Value Date   ALT 22 08/05/2018   AST 18 08/05/2018   ALKPHOS 97 08/05/2018   BILITOT 0.3 08/05/2018

## 2019-05-04 NOTE — Patient Instructions (Addendum)
Please suspend your biotin supplements 4 days before your thyroid test to avoid skewing the results of the test

## 2019-05-04 NOTE — Assessment & Plan Note (Signed)
Now managed with inderal  per Neurology consultation. No changes unless she is orthostatic on exam next week

## 2019-05-04 NOTE — Assessment & Plan Note (Signed)
Resolved with changes in eating habits and detergent. Allergy testing was reportedly negative.

## 2019-05-04 NOTE — Assessment & Plan Note (Signed)
Patient is advised to suspend her biotin for several days prior to her next TSH

## 2019-05-27 ENCOUNTER — Other Ambulatory Visit: Payer: Self-pay

## 2019-05-27 ENCOUNTER — Encounter: Payer: Self-pay | Admitting: Internal Medicine

## 2019-05-27 ENCOUNTER — Ambulatory Visit (INDEPENDENT_AMBULATORY_CARE_PROVIDER_SITE_OTHER): Payer: Medicare Other | Admitting: Internal Medicine

## 2019-05-27 VITALS — BP 136/80 | HR 67 | Temp 98.1°F | Resp 15 | Ht 61.0 in | Wt 121.0 lb

## 2019-05-27 DIAGNOSIS — R030 Elevated blood-pressure reading, without diagnosis of hypertension: Secondary | ICD-10-CM

## 2019-05-27 DIAGNOSIS — E785 Hyperlipidemia, unspecified: Secondary | ICD-10-CM

## 2019-05-27 DIAGNOSIS — Z1231 Encounter for screening mammogram for malignant neoplasm of breast: Secondary | ICD-10-CM

## 2019-05-27 DIAGNOSIS — Z1211 Encounter for screening for malignant neoplasm of colon: Secondary | ICD-10-CM

## 2019-05-27 DIAGNOSIS — G250 Essential tremor: Secondary | ICD-10-CM | POA: Diagnosis not present

## 2019-05-27 NOTE — Patient Instructions (Signed)
Continue the inderal if tolerated  Continue to heck blood pressure and notify me if readings are 140/80 on a persistent basis  Referral for colonoscopy in progress  Your annual mammogram has been ordered.  You are encouraged (required) to call to make your appointment at [facility]   Health Maintenance After Age 79 After age 39, you are at a higher risk for certain long-term diseases and infections as well as injuries from falls. Falls are a major cause of broken bones and head injuries in people who are older than age 91. Getting regular preventive care can help to keep you healthy and well. Preventive care includes getting regular testing and making lifestyle changes as recommended by your health care provider. Talk with your health care provider about:  Which screenings and tests you should have. A screening is a test that checks for a disease when you have no symptoms.  A diet and exercise plan that is right for you. What should I know about screenings and tests to prevent falls? Screening and testing are the best ways to find a health problem early. Early diagnosis and treatment give you the best chance of managing medical conditions that are common after age 38. Certain conditions and lifestyle choices may make you more likely to have a fall. Your health care provider may recommend:  Regular vision checks. Poor vision and conditions such as cataracts can make you more likely to have a fall. If you wear glasses, make sure to get your prescription updated if your vision changes.  Medicine review. Work with your health care provider to regularly review all of the medicines you are taking, including over-the-counter medicines. Ask your health care provider about any side effects that may make you more likely to have a fall. Tell your health care provider if any medicines that you take make you feel dizzy or sleepy.  Osteoporosis screening. Osteoporosis is a condition that causes  the bones to get weaker. This can make the bones weak and cause them to break more easily.  Blood pressure screening. Blood pressure changes and medicines to control blood pressure can make you feel dizzy.  Strength and balance checks. Your health care provider may recommend certain tests to check your strength and balance while standing, walking, or changing positions.  Foot health exam. Foot pain and numbness, as well as not wearing proper footwear, can make you more likely to have a fall.  Depression screening. You may be more likely to have a fall if you have a fear of falling, feel emotionally low, or feel unable to do activities that you used to do.  Alcohol use screening. Using too much alcohol can affect your balance and may make you more likely to have a fall. What actions can I take to lower my risk of falls? General instructions  Talk with your health care provider about your risks for falling. Tell your health care provider if: ? You fall. Be sure to tell your health care provider about all falls, even ones that seem minor. ? You feel dizzy, sleepy, or off-balance.  Take over-the-counter and prescription medicines only as told by your health care provider. These include any supplements.  Eat a healthy diet and maintain a healthy weight. A healthy diet includes low-fat dairy products, low-fat (lean) meats, and fiber from whole grains, beans, and lots of fruits and vegetables. Home safety  Remove any tripping hazards, such as rugs, cords, and clutter.  Install safety equipment such as  grab bars in bathrooms and safety rails on stairs.  Keep rooms and walkways well-lit. Activity   Follow a regular exercise program to stay fit. This will help you maintain your balance. Ask your health care provider what types of exercise are appropriate for you.  If you need a cane or walker, use it as recommended by your health care provider.  Wear supportive shoes that have nonskid soles.  Lifestyle  Do not drink alcohol if your health care provider tells you not to drink.  If you drink alcohol, limit how much you have: ? 0-1 drink a day for women. ? 0-2 drinks a day for men.  Be aware of how much alcohol is in your drink. In the [place] one drink equals one typical bottle of beer (12 oz), one-half glass of wine (5 oz), or one shot of hard liquor (1 oz).  Do not use any products that contain nicotine or tobacco, such as cigarettes and e-cigarettes. If you need help quitting, ask your health care provider. Summary  Having a healthy lifestyle and getting preventive care can help to protect your health and wellness after age 79.  Screening and testing are the best way to find a health problem early and help you avoid having a fall. Early diagnosis and treatment give you the best chance for managing medical conditions that are more common for people who are older than age 79.  Falls are a major cause of broken bones and head injuries in people who are older than age 79. Take precautions to prevent a fall at home.  Work with your health care provider to learn what changes you can make to improve your health and wellness and to prevent falls. This information is not intended to replace advice given to you by your health care provider. Make sure you discuss any questions you have with your health care provider. Document Released: 08/19/2017 Document Revised: 01/27/2019 Document Reviewed: 08/19/2017 Elsevier Patient Education  2020 [employer]

## 2019-05-27 NOTE — Progress Notes (Signed)
Patient ID: Vanessa Brown, female    DOB: 01/03/1941  Age: 78 y.o. MRN: [id number]  The patient is here for follow up and manageme t of other chronic and acute problems.    Colonoscopy 2015:  5 yr follow up due Mammogram postponed and now overdue   The risk factors are reflected in the social history.  The roster of all physicians providing medical care to patient - is listed in the Snapshot section of the chart.  Activities of daily living:  The patient is 100% independent in all ADLs: dressing, toileting, feeding as well as independent mobility  Home safety : The patient has smoke detectors in the home. They wear seatbelts.  There are no firearms at home. There is no violence in the home.   There is no risks for hepatitis, STDs or HIV. There is no   history of blood transfusion. They have no travel history to infectious disease endemic areas of the world.  The patient has seen their dentist in the last six month. They have seen their eye doctor in the last year. They admit to slight hearing difficulty with regard to whispered voices and some television programs.  They have deferred audiologic testing in the last year.  They do not  have excessive sun exposure. Discussed the need for sun protection: hats, long sleeves and use of sunscreen if there is significant sun exposure.   Diet: the importance of a healthy diet is discussed. They do have a healthy diet.  The benefits of regular aerobic exercise were discussed. She walks 4 times per week ,  20 minutes.   Depression screen: there are no signs or vegative symptoms of depression- irritability, change in appetite, anhedonia, sadness/tearfullness.  Cognitive assessment: the patient manages all their financial and personal affairs and is actively engaged. They could relate day,date,year and events; recalled 2/3 objects at 3 minutes; performed clock-face test normally.  The following portions of the patient's history were reviewed and updated  as appropriate: allergies, current medications, past family history, past medical history,  past surgical history, past social history  and problem list.  Visual acuity was not assessed per patient preference since she has regular follow up with her ophthalmologist. Hearing and body mass index were assessed and reviewed.   During the course of the visit the patient was educated and counseled about appropriate screening and preventive services including : fall prevention , diabetes screening, nutrition counseling, colorectal cancer screening, and recommended immunizations.    CC: The primary encounter diagnosis was Hyperlipidemia LDL goal <130. Diagnoses of Encounter for screening mammogram for breast cancer, Benign essential tremor, Colon cancer screening, and Elevated blood pressure reading in office without diagnosis of hypertension were also pertinent to this visit.  Essential tremor : neurology managing with primidone and propranolol   She has been having recurrent dizzy spells with sudden position change ,  Thinks they started after starting inderal .  Has reduced dose to  10 mg twice daily.  Her tremor has not improved.   BP measurements : supine 170/.90  Standing 140/70  Home reeadings  116/.70 at home ; no change with standing     History Vanessa Brown has a past medical history of Anxiety, Arthritis, Cervical back pain with evidence of disc disease (2007), Duodenal ulcer, Gastric ulcer, GERD (gastroesophageal reflux disease), Irritable bowel syndrome (IBS), Neuropathy, Screening for breast cancer, Screening for cervical cancer, and Tremor, essential.   She has a past surgical history that includes Rotator cuff repair (  2004); Biopsy breast; Abdominal hysterectomy (1967); Breast surgery; Breast biopsy (Right, 2012); and Breast biopsy (Left, 2007).   Her family history includes Dementia in her father; Diabetes in her mother and son; Heart block in her sister; Parkinson's disease in her father.She  reports that she quit smoking about 19 years ago. Her smoking use included cigarettes. She has never used smokeless tobacco. She reports current alcohol use. She reports that she does not use drugs.  Outpatient Medications Prior to Visit  Medication Sig Dispense Refill  . Biotin 5000 MCG CAPS Take 1 capsule by mouth daily.    . cholecalciferol (VITAMIN D) 1000 units tablet Take 1,000 Units by mouth daily.    . cyanocobalamin 1000 MCG tablet Take 1,000 mcg by mouth daily.    Marland Kitchen. estradiol (CLIMARA - DOSED IN MG/24 HR) 0.025 mg/24hr patch APPLY 1 .PATCH. TOPICALLY ONCE A WEEK 4 patch 1  . levothyroxine (SYNTHROID, LEVOTHROID) 50 MCG tablet TAKE 1 TABLET BY MOUTH ONCE DAILY 90 tablet 1  . Multiple Vitamins-Minerals (MULTIVITAMIN ADULT EXTRA C PO) Take by mouth.    . primidone (MYSOLINE) 50 MG tablet Take 250 mg by mouth 2 (two) times daily.     . propranolol (INDERAL) 20 MG tablet Take 20 mg by mouth 2 (two) times daily.      No facility-administered medications prior to visit.     Review of Systems   Patient denies headache, fevers, malaise, unintentional weight loss, skin rash, eye pain, sinus congestion and sinus pain, sore throat, dysphagia,  hemoptysis , cough, dyspnea, wheezing, chest pain, palpitations, orthopnea, edema, abdominal pain, nausea, melena, diarrhea, constipation, flank pain, dysuria, hematuria, urinary  Frequency, nocturia, numbness, tingling, seizures,  Focal weakness, Loss of consciousness,  Tremor, insomnia, depression, anxiety, and suicidal ideation.      Objective:  BP 136/80 (BP Location: Left Arm, Patient Position: Sitting, Cuff Size: Normal)   Pulse 67   Temp 98.1 F (36.7 C) (Oral)   Resp 15   Ht 5\' 1"  (1.549 m)   Wt 121 lb (54.9 kg)   SpO2 97%   BMI 22.86 kg/m   Physical Exam   General appearance: alert, cooperative and appears stated age Head: Normocephalic, without obvious abnormality, atraumatic Eyes: conjunctivae/corneas clear. PERRL, EOM's intact.  Fundi benign. Ears: normal TM's and external ear canals both ears Nose: Nares normal. Septum midline. Mucosa normal. No drainage or sinus tenderness. Throat: lips, mucosa, and tongue normal; teeth and gums normal Neck: no adenopathy, no carotid bruit, no JVD, supple, symmetrical, trachea midline and thyroid not enlarged, symmetric, no tenderness/mass/nodules Lungs: clear to auscultation bilaterally Breasts: normal appearance, no masses or tenderness Heart: regular rate and rhythm, S1, S2 normal, no murmur, click, rub or gallop Abdomen: soft, non-tender; bowel sounds normal; no masses,  no organomegaly Extremities: extremities normal, atraumatic, no cyanosis or edema Pulses: 2+ and symmetric Skin: Skin color, texture, turgor normal. No rashes or lesions Neurologic: Alert and oriented X 3, normal strength and tone. Normal symmetric reflexes. Normal coordination and gait.      Assessment & Plan:   Problem List Items Addressed This Visit      Unprioritized   Benign essential tremor    Now managed with inderal and primidone  per Neurology consultation.  she is orthostatic on exam but her standing systolic is still 140         Hyperlipidemia LDL goal <130 - Primary    She did not tolerate low dose crestor.  She has stopped taking red yeast  rice as well.  Fasting lipids are elevated but she has no known CAD . Her calculated 10 yr risk is 25%  Lab Results  Component Value Date   CHOL 283 (H) 05/27/2019   HDL 81 05/27/2019   LDLCALC 177 (H) 05/27/2019   LDLDIRECT 137.0 12/22/2017   TRIG 118 05/27/2019   CHOLHDL 3.5 05/27/2019   Lab Results  Component Value Date   ALT 12 05/27/2019   AST 15 05/27/2019   ALKPHOS 97 08/05/2018   BILITOT 0.2 05/27/2019         Relevant Orders   Lipid panel (Completed)   Comprehensive metabolic panel (Completed)   Elevated blood pressure reading in office without diagnosis of hypertension    Supine reading of 093 systolic noted.  Standing BP 818  systolic .  Patient advised to monitor her readings at home  and submit readings.        Other Visit Diagnoses    Encounter for screening mammogram for breast cancer       Relevant Orders   MM 3D SCREEN BREAST BILATERAL   Colon cancer screening       Relevant Orders   Ambulatory referral to Gastroenterology      I am having Franne Grip. [place] maintain her primidone, cyanocobalamin, Biotin, Multiple Vitamins-Minerals (MULTIVITAMIN ADULT EXTRA C PO), cholecalciferol, propranolol, levothyroxine, and estradiol.  No orders of the defined types were placed in this encounter.   There are no discontinued medications.  Follow-up: No follow-ups on file.   Crecencio Mc, MD

## 2019-05-28 LAB — LIPID PANEL
Cholesterol: 283 mg/dL — ABNORMAL HIGH (ref <200)
HDL: 81 mg/dL (ref >=50)
LDL Cholesterol (Calc): 177 mg/dL (calc) — ABNORMAL HIGH
Non-HDL Cholesterol (Calc): 202 mg/dL (calc) — ABNORMAL HIGH (ref <130)
Total CHOL/HDL Ratio: 3.5 (calc) (ref <5.0)
Triglycerides: 118 mg/dL (ref <150)

## 2019-05-28 LAB — COMPREHENSIVE METABOLIC PANEL
AG Ratio: 1.8 (calc) (ref 1.0–2.5)
ALT: 12 U/L (ref 6–29)
AST: 15 U/L (ref 10–35)
Albumin: 4.3 g/dL (ref 3.6–5.1)
Alkaline phosphatase (APISO): 89 U/L (ref 37–153)
BUN/Creatinine Ratio: 14 (calc) (ref 6–22)
BUN: 15 mg/dL (ref 7–25)
CO2: 28 mmol/L (ref 20–32)
Calcium: 9.7 mg/dL (ref 8.6–10.4)
Chloride: 102 mmol/L (ref 98–110)
Creat: 1.05 mg/dL — ABNORMAL HIGH (ref 0.60–0.93)
Globulin: 2.4 g/dL (calc) (ref 1.9–3.7)
Glucose, Bld: 86 mg/dL (ref 65–99)
Potassium: 4.9 mmol/L (ref 3.5–5.3)
Sodium: 138 mmol/L (ref 135–146)
Total Bilirubin: 0.2 mg/dL (ref 0.2–1.2)
Total Protein: 6.7 g/dL (ref 6.1–8.1)

## 2019-05-29 ENCOUNTER — Other Ambulatory Visit: Payer: Self-pay | Admitting: Internal Medicine

## 2019-05-29 DIAGNOSIS — R944 Abnormal results of kidney function studies: Secondary | ICD-10-CM

## 2019-05-29 NOTE — Assessment & Plan Note (Addendum)
She did not tolerate low dose crestor.  She has stopped taking red yeast rice as well.  Fasting lipids are elevated but she has no known CAD . Her calculated 10 yr risk is 25%  Lab Results  Component Value Date   CHOL 283 (H) 05/27/2019   HDL 81 05/27/2019   LDLCALC 177 (H) 05/27/2019   LDLDIRECT 137.0 12/22/2017   TRIG 118 05/27/2019   CHOLHDL 3.5 05/27/2019   Lab Results  Component Value Date   ALT 12 05/27/2019   AST 15 05/27/2019   ALKPHOS 97 08/05/2018   BILITOT 0.2 05/27/2019

## 2019-05-29 NOTE — Assessment & Plan Note (Signed)
Supine reading of 119 systolic noted.  Standing BP 417 systolic .  Patient advised to monitor her readings at home  and submit readings.

## 2019-05-29 NOTE — Assessment & Plan Note (Signed)
Now managed with inderal and primidone  per Neurology consultation.  she is orthostatic on exam but her standing systolic is still 909

## 2019-06-01 DIAGNOSIS — H5203 Hypermetropia, bilateral: Secondary | ICD-10-CM | POA: Diagnosis not present

## 2019-06-01 DIAGNOSIS — H02889 Meibomian gland dysfunction of unspecified eye, unspecified eyelid: Secondary | ICD-10-CM | POA: Diagnosis not present

## 2019-06-01 DIAGNOSIS — H2513 Age-related nuclear cataract, bilateral: Secondary | ICD-10-CM | POA: Diagnosis not present

## 2019-06-02 ENCOUNTER — Telehealth: Payer: Self-pay

## 2019-06-02 ENCOUNTER — Telehealth: Payer: Self-pay | Admitting: Internal Medicine

## 2019-06-02 NOTE — Telephone Encounter (Signed)
Called pt in regards to her lab results. Explained to pt that Dr. Derrel Nip wants her to return to have her kidney function rechecked. Pt stated that she didn't know if she would be able to come in for a lab appt next week so she said she would give [place] a call back to schedule. Copy of lab results has been mailed to pt.

## 2019-06-02 NOTE — Telephone Encounter (Signed)
Patient called to discuss lab results. She questions if 2 of her medications could have an affect on the abnormal cholesterol numbers. The two medications are Propranolol and Primidone. Please have physician see labs and advise patient.

## 2019-06-02 NOTE — Telephone Encounter (Signed)
Copied from [place] [id number]. Topic: General - Other >> Jun 02, 2019 10:25 AM Leward Quan A wrote: Reason for CRM: Patient called to request a copy of her labs to be mailed to her because she cannot access her My Chart until she purchases a new computer.

## 2019-06-02 NOTE — Telephone Encounter (Signed)
Please see previous message

## 2019-07-01 ENCOUNTER — Ambulatory Visit
Admission: RE | Admit: 2019-07-01 | Discharge: 2019-07-01 | Disposition: A | Discharge status: Home / Self Care | Payer: Medicare Other | Source: Ambulatory Visit | Attending: Internal Medicine | Admitting: Internal Medicine

## 2019-07-01 DIAGNOSIS — Z1231 Encounter for screening mammogram for malignant neoplasm of breast: Secondary | ICD-10-CM

## 2019-07-13 DIAGNOSIS — Z23 Encounter for immunization: Secondary | ICD-10-CM | POA: Diagnosis not present

## 2019-07-26 DIAGNOSIS — M5136 Other intervertebral disc degeneration, lumbar region: Secondary | ICD-10-CM | POA: Diagnosis not present

## 2019-07-26 DIAGNOSIS — M5416 Radiculopathy, lumbar region: Secondary | ICD-10-CM | POA: Diagnosis not present

## 2019-07-26 DIAGNOSIS — M48062 Spinal stenosis, lumbar region with neurogenic claudication: Secondary | ICD-10-CM | POA: Diagnosis not present

## 2019-08-24 DIAGNOSIS — M5412 Radiculopathy, cervical region: Secondary | ICD-10-CM | POA: Diagnosis not present

## 2019-08-24 DIAGNOSIS — G250 Essential tremor: Secondary | ICD-10-CM | POA: Diagnosis not present

## 2019-08-24 DIAGNOSIS — M542 Cervicalgia: Secondary | ICD-10-CM | POA: Diagnosis not present

## 2019-08-25 ENCOUNTER — Other Ambulatory Visit: Payer: Self-pay | Admitting: Internal Medicine

## 2019-09-01 ENCOUNTER — Other Ambulatory Visit: Payer: Self-pay | Admitting: Internal Medicine

## 2019-09-02 DIAGNOSIS — M5416 Radiculopathy, lumbar region: Secondary | ICD-10-CM | POA: Diagnosis not present

## 2019-09-02 DIAGNOSIS — M7542 Impingement syndrome of left shoulder: Secondary | ICD-10-CM | POA: Diagnosis not present

## 2019-09-02 DIAGNOSIS — M5030 Other cervical disc degeneration, unspecified cervical region: Secondary | ICD-10-CM | POA: Diagnosis not present

## 2019-09-02 DIAGNOSIS — M48062 Spinal stenosis, lumbar region with neurogenic claudication: Secondary | ICD-10-CM | POA: Diagnosis not present

## 2019-09-02 DIAGNOSIS — M5412 Radiculopathy, cervical region: Secondary | ICD-10-CM | POA: Diagnosis not present

## 2019-09-02 DIAGNOSIS — M5136 Other intervertebral disc degeneration, lumbar region: Secondary | ICD-10-CM | POA: Diagnosis not present

## 2019-09-20 ENCOUNTER — Other Ambulatory Visit: Payer: Self-pay | Admitting: Internal Medicine

## 2019-09-28 ENCOUNTER — Other Ambulatory Visit: Payer: Self-pay | Admitting: Internal Medicine

## 2019-09-29 DIAGNOSIS — M48062 Spinal stenosis, lumbar region with neurogenic claudication: Secondary | ICD-10-CM | POA: Diagnosis not present

## 2019-09-29 DIAGNOSIS — M5416 Radiculopathy, lumbar region: Secondary | ICD-10-CM | POA: Diagnosis not present

## 2019-09-29 DIAGNOSIS — M5136 Other intervertebral disc degeneration, lumbar region: Secondary | ICD-10-CM | POA: Diagnosis not present

## 2019-11-14 ENCOUNTER — Telehealth: Payer: Self-pay | Admitting: Internal Medicine

## 2019-11-14 NOTE — Telephone Encounter (Signed)
  I see no reason why she should not receive the covid vaccine UNLESS she has had a bad reaction to another vaccine  . There is nothing in the ingredients that contains shellfish proteins and nothing in the handout for the Pfizer vaccine that says people with shellfish allergy should stay away  I am checking with Catie Feliz Beam to make sure

## 2019-11-14 NOTE — Telephone Encounter (Signed)
Pt wants to know if she should get the covid vaccine. Pt is allergic to shrimp. Pt states she breaks out and lips swell when she eats them. Please advise

## 2019-11-14 NOTE — Telephone Encounter (Signed)
Agree. A shellfish allergy does not put her at an increased risk for an allergic reaction w/ COVID vaccination. Would recommend she get the vaccination.

## 2019-11-15 NOTE — Telephone Encounter (Signed)
Without knowing exactly which IV contrast she received, I can't say whether it had polyethylene glycol (PEG) or not. Most do not.   Would recommend asking if she's ever had issues with Miralax or colonoscopy bowel prep agents containing polyethylene glycol. She can call her outpatient pharmacy to see if the bowel prep agents they ever dispensed for her were polyethylene glycol. If she has had PEG with no issues, I see no reason to avoid COVID vaccine.   Vanessa Brown

## 2019-11-15 NOTE — Telephone Encounter (Signed)
Spoke with pt to let her know that there is no contraindication with the covid vaccine and shellfish. The pt then went on to tell me that she has also had a reaction to IV contrast dye. She stated that she broke out in hives and after that was when she developed an allergy to shellfish. Is the patient still okay to get the vaccine?

## 2019-11-15 NOTE — Telephone Encounter (Signed)
Dr Darrick Huntsman sent you this message yesterday.  The patient also informed is allergic to contrast dye.  I am not aware of any known contraindication for her to get covid vaccine.  Are you?  Thanks

## 2019-11-15 NOTE — Telephone Encounter (Signed)
See Catie's response.  If questions, see me before calling.  Let me know if pt has questions.

## 2019-11-16 NOTE — Telephone Encounter (Signed)
PEG allergy is incredibly rare. I don't see any reason she should hold on COVID vaccine given the information she provided to [place].   Would definitely plan to wait for 30 minutes, as opposed to 15 minutes, after injection though.

## 2019-11-16 NOTE — Telephone Encounter (Signed)
See attached message and also forward to Dr Darrick Huntsman for her review as well.

## 2019-11-16 NOTE — Telephone Encounter (Signed)
  I see no reason why she should not receive the covid vaccine.

## 2019-11-16 NOTE — Telephone Encounter (Signed)
Spoke with pt and she stated that she has never had to use Miralax and the pharmacy that she prescribed her the bowel prep is no longer in business. She did state that she has had numerous colonoscopies and endoscopies and never had any trouble with them.

## 2019-11-17 NOTE — Telephone Encounter (Signed)
Spoke with pt to let her know that both Dr. Darrick Huntsman and Catie do not see any reason why she should not get the vaccine. Pt gave a verbal understanding.

## 2019-12-05 ENCOUNTER — Other Ambulatory Visit: Payer: Self-pay | Admitting: Internal Medicine

## 2019-12-26 ENCOUNTER — Other Ambulatory Visit: Payer: Self-pay

## 2019-12-26 ENCOUNTER — Ambulatory Visit (INDEPENDENT_AMBULATORY_CARE_PROVIDER_SITE_OTHER): Payer: Medicare Other

## 2019-12-26 VITALS — Ht 61.0 in | Wt 121.0 lb

## 2019-12-26 DIAGNOSIS — Z0000 Encounter for general adult medical examination without abnormal findings: Secondary | ICD-10-CM

## 2019-12-26 NOTE — Patient Instructions (Addendum)
  Vanessa Brown , Thank you for taking time to come for your Medicare Wellness Visit. I appreciate your ongoing commitment to your health goals. Please review the following plan we discussed and let me know if I can assist you in the future.   These are the goals we discussed: Goals    . Follow up with Primary Care Provider     As needed       This is a list of the screening recommended for you and due dates:  Health Maintenance  Topic Date Due  . Tetanus Vaccine  07[birth year]29/1960  . Flu Shot  Completed  . DEXA scan (bone density measurement)  Completed  . Pneumonia vaccines  Completed  . Colon Cancer Screening  Discontinued

## 2019-12-26 NOTE — Progress Notes (Addendum)
Subjective:   Vanessa Brown is a 79 y.o. female who presents for Medicare Annual (Subsequent) preventive examination.  Review of Systems:  No ROS.  Medicare Wellness Virtual Visit.  Visual/audio telehealth visit, UTA vital signs.   Ht/Wt provided.  See social history for additional risk factors.   Cardiac Risk Factors include: advanced age (>103men, >54 women)     Objective:     Vitals: Ht 5\' 1"  (1.549 m)   Wt 121 lb (54.9 kg)   BMI 22.86 kg/m   Body mass index is 22.86 kg/m.  Advanced Directives 12/26/2019 12/24/2018 12/22/2017 12/15/2016  Does Patient Have a Medical Advance Directive? Yes Yes Yes Yes  Type of Paramedic of [place];Living will [place];Living will [place];Living will [place];Living will  Does patient want to make changes to medical advance directive? No - Patient declined No - Patient declined No - Patient declined No - Patient declined  Copy of [place] in Chart? No - copy requested No - copy requested No - copy requested No - copy requested    Tobacco Social History   Tobacco Use  Smoking Status Former Smoker  . Types: Cigarettes  . Quit date: 09/03/1999  . Years since quitting: 20.3  Smokeless Tobacco Never Used     Counseling given: Not Answered   Clinical Intake:  Pre-visit preparation completed: Yes        Diabetes: No  How often do you need to have someone help you when you read instructions, pamphlets, or other written materials from your doctor or pharmacy?: 1 - Never  Interpreter Needed?: No     Past Medical History:  Diagnosis Date  . Anxiety   . Arthritis    spine, prior Pain Clinic patient  . Cervical back pain with evidence of disc disease 2007   prior epidural steorid injection s  . Duodenal ulcer   . Gastric ulcer    H Pylori positive  . GERD (gastroesophageal reflux disease)   . Irritable bowel syndrome (IBS)    Teena Irani, [facility]  . Neuropathy    "toilet seat", Manuella Ghazi), caused  by sitting on commode  . Screening for breast cancer   . Screening for cervical cancer   . Tremor, essential    treated with primidone 25 mg qhs   Past Surgical History:  Procedure Laterality Date  . ABDOMINAL HYSTERECTOMY  1967  . BIOPSY BREAST     benign,. right breast ([place])  . BREAST BIOPSY Right 2012   NEG  . BREAST BIOPSY Left 2007   NEG  . BREAST SURGERY     [place], benign biopsies  . ROTATOR CUFF REPAIR  2004   right shoulder , [place]   Family History  Problem Relation Age of Onset  . Diabetes Mother   . Parkinson's disease Father   . Dementia Father   . Diabetes Son   . Heart block Sister   . Breast cancer Neg Hx    Social History   Socioeconomic History  . Marital status: Married    Spouse name: Not on file  . Number of children: Not on file  . Years of education: Not on file  . Highest education level: Not on file  Occupational History  . Not on file  Tobacco Use  . Smoking status: Former Smoker    Types: Cigarettes    Quit date: 09/03/1999    Years since quitting: 20.3  .  Smokeless tobacco: Never Used  Substance and Sexual Activity  . Alcohol use: Yes    Alcohol/week: 0.0 standard drinks    Comment: OCC  . Drug use: No  . Sexual activity: Never  Other Topics Concern  . Not on file  Social History Narrative  . Not on file   Social Determinants of Health   Financial Resource Strain: Low Risk   . Difficulty of Paying Living Expenses: Not hard at all  Food Insecurity: No Food Insecurity  . Worried About Programme researcher, broadcasting/film/video in the Last Year: Never true  . Ran Out of Food in the Last Year: Never true  Transportation Needs: No Transportation Needs  . Lack of Transportation (Medical): No  . Lack of Transportation (Non-Medical): No  Physical Activity: Sufficiently Active  . Days of Exercise per Week: 5 days  . Minutes of Exercise per Session: 30 min  Stress: No Stress  Concern Present  . Feeling of Stress : Not at all  Social Connections: Unknown  . Frequency of Communication with Friends and Family: More than three times a week  . Frequency of Social Gatherings with Friends and Family: More than three times a week  . Attends Religious Services: More than 4 times per year  . Active Member of Clubs or Organizations: Yes  . Attends Banker Meetings: More than 4 times per year  . Marital Status: Not on file    Outpatient Encounter Medications as of 12/26/2019  Medication Sig  . Biotin 5000 MCG CAPS Take 1 capsule by mouth daily.  . cholecalciferol (VITAMIN D) 1000 units tablet Take 1,000 Units by mouth daily.  . cyanocobalamin 1000 MCG tablet Take 1,000 mcg by mouth daily.  Marland Kitchen estradiol (CLIMARA - DOSED IN MG/24 HR) 0.025 mg/24hr patch APPLY ONE PATCH WEEKLY.  . levothyroxine (SYNTHROID) 50 MCG tablet TAKE 1 TABLET DAILY.  . Multiple Vitamins-Minerals (MULTIVITAMIN ADULT EXTRA C PO) Take by mouth.  . primidone (MYSOLINE) 50 MG tablet Take 250 mg by mouth 2 (two) times daily.   . propranolol (INDERAL) 20 MG tablet Take 20 mg by mouth 2 (two) times daily.    No facility-administered encounter medications on file as of 12/26/2019.    Activities of Daily Living In your present state of health, do you have any difficulty performing the following activities: 12/26/2019  Hearing? N  Vision? N  Difficulty concentrating or making decisions? N  Walking or climbing stairs? N  Dressing or bathing? N  Doing errands, shopping? N  Preparing Food and eating ? N  Using the Toilet? N  In the past six months, have you accidently leaked urine? N  Do you have problems with loss of bowel control? N  Managing your Medications? N  Managing your Finances? N  Housekeeping or managing your Housekeeping? N  Some recent data might be hidden    Patient Care Team: Sherlene Shams, MD as PCP - General (Internal Medicine)    Assessment:   This is a routine  wellness examination for Vanessa Brown.  Nurse connected with patient 12/26/19 at 11:00 AM EST by a telephone enabled telemedicine application and verified that I am speaking with the correct person using two identifiers. Patient stated full name and DOB. Patient gave permission to continue with virtual visit. Patient's location was at home and Nurse's location was at [place] office.   Patient is alert and oriented x3. Patient denies difficulty focusing or concentrating. Patient likes to read, listen, and is social connected for  brain stimulation.   Health Maintenance Due: -Tdap vaccine- discussed; to be completed with doctor in visit or local pharmacy.   -Colonoscopy- deferred per patient request See completed HM at the end of note.   Eye: Visual acuity not assessed. Virtual visit. Followed by their ophthalmologist.  Dental: UTD.    Hearing: Demonstrates normal hearing during visit.  Safety:  Patient feels safe at home- yes Patient does have smoke detectors at home- yes Patient does wear sunscreen or protective clothing when in direct sunlight - yes Patient does wear seat belt when in a moving vehicle - yes Patient drives- yes Adequate lighting in walkways free from debris- yes Grab bars and handrails used as appropriate- yes Ambulates with an assistive device- no Cell phone on person when ambulating outside of the home- yes  Social: Alcohol intake - yes      Smoking history- former  Smokers in home? none Illicit drug use? none  Medication: Taking as directed and without issues.  Self managed - yes   Covid-19: Precautions and sickness symptoms discussed. Wears mask, social distancing, hand hygiene as appropriate.   Activities of Daily Living Patient denies needing assistance with: household chores, feeding themselves, getting from bed to chair, getting to the toilet, bathing/showering, dressing, managing money, or preparing meals.   Discussed the importance of a healthy diet,  water intake and the benefits of aerobic exercise.   Physical activity- dancing 4-5 times weekly, 30 minutes  Diet:  Regular Water: good intake Caffeine: none  Other Providers Patient Care Team: Sherlene Shams, MD as PCP - General (Internal Medicine)  Exercise Activities and Dietary recommendations Current Exercise Habits: Home exercise routine, Time (Minutes): 30, Frequency (Times/Week): 5, Weekly Exercise (Minutes/Week): 150, Intensity: Intense  Goals    . Follow up with Primary Care Provider     As needed       Fall Risk Fall Risk  12/26/2019 12/24/2018 12/22/2017 12/15/2016 12/14/2015  Falls in the past year? 0 0 Yes Yes No  Number falls in past yr: - - - 1 -  Injury with Fall? - - - No -  Follow up Falls evaluation completed - - - -   Timed Get Up and Go performed: no, virtual visit  Depression Screen PHQ 2/9 Scores 12/26/2019 12/26/2019 12/24/2018 12/22/2017  PHQ - 2 Score 0 0 0 0     Cognitive Function     6CIT Screen 12/26/2019 12/24/2018 12/22/2017 12/15/2016  What Year? 0 points 0 points 0 points 0 points  What month? 0 points 0 points 0 points 0 points  What time? 0 points 0 points 0 points 0 points  Count back from 20 0 points 0 points 0 points 0 points  Months in reverse 0 points 0 points 0 points 0 points  Repeat phrase - 0 points 0 points 0 points  Total Score - 0 0 0    Immunization History  Administered Date(s) Administered  . Influenza Split 08/25/2011, 07/24/2014  . Influenza-Unspecified 08/03/2012, 07/25/2015, 08/05/2016, 08/03/2017, 12/22/2017, 07/22/2018  . PFIZER SARS-COV-2 Vaccination 11/23/2019, 12/18/2019  . Pneumococcal Conjugate-13 11/22/2014  . Pneumococcal Polysaccharide-23 11/21/2006   Screening Tests Health Maintenance  Topic Date Due  . TETANUS/TDAP  07/29/1960  . INFLUENZA VACCINE  Completed  . DEXA SCAN  Completed  . PNA vac Low Risk Adult  Completed  . COLONOSCOPY  Discontinued      Plan:   Keep all routine maintenance appointments.    Medicare Attestation I have personally reviewed: The patient's  medical and social history Their use of alcohol, tobacco or illicit drugs Their current medications and supplements The patient's functional ability including ADLs,fall risks, home safety risks, cognitive, and hearing and visual impairment Diet and physical activities Evidence for depression   I have reviewed and discussed with patient certain preventive protocols, quality metrics, and best practice recommendations.     OBrien-Blaney, Ishita Mcnerney L, LPN  10/28/1476    I have reviewed the above information and agree with above.   Duncan Dull, MD

## 2020-02-21 ENCOUNTER — Other Ambulatory Visit: Payer: Self-pay | Admitting: Internal Medicine

## 2020-03-01 DIAGNOSIS — G250 Essential tremor: Secondary | ICD-10-CM | POA: Diagnosis not present

## 2020-03-08 ENCOUNTER — Other Ambulatory Visit: Payer: Self-pay | Admitting: Internal Medicine

## 2020-03-21 ENCOUNTER — Encounter: Payer: Self-pay | Admitting: Internal Medicine

## 2020-03-21 ENCOUNTER — Other Ambulatory Visit: Payer: Self-pay

## 2020-03-21 ENCOUNTER — Ambulatory Visit (INDEPENDENT_AMBULATORY_CARE_PROVIDER_SITE_OTHER): Payer: Medicare Other | Admitting: Internal Medicine

## 2020-03-21 ENCOUNTER — Ambulatory Visit: Payer: Medicare Other | Admitting: Internal Medicine

## 2020-03-21 VITALS — BP 122/80 | HR 53 | Temp 97.4°F | Ht 61.0 in | Wt 122.4 lb

## 2020-03-21 DIAGNOSIS — M7022 Olecranon bursitis, left elbow: Secondary | ICD-10-CM | POA: Diagnosis not present

## 2020-03-21 NOTE — Patient Instructions (Addendum)
Dr. Kirstie Mirza orthopedics   Elbow Bursitis  Bursitis is swelling and pain at the tip of the elbow. This happens when fluid builds up in a sac under the skin (bursa). This may also be called olecranon bursitis. What are the causes? Elbow bursitis may be caused by:  Elbow injury, such as falling onto the elbow.  Leaning on hard surfaces for long periods of time.  Infection from an injury that breaks the skin near the elbow.  A bone growth (spur) that forms at the tip of the elbow.  A medical condition that causes inflammation, such as gout or rheumatoid arthritis. Sometimes the cause is not known. What are the signs or symptoms? The first sign of elbow bursitis is usually swelling at the tip of the elbow. This can grow to be about the size of a golf ball. Swelling may start suddenly or develop gradually. Other symptoms may include:  Pain when bending or leaning on the elbow.  Not being able to move the elbow normally. If bursitis is caused by an infection, you may have:  Redness, warmth, and tenderness of the elbow.  Drainage of pus from the swollen area over the elbow, if the skin breaks open. How is this diagnosed? This condition may be diagnosed based on:  Your symptoms and medical history.  Any recent injuries you have had.  A physical exam.  X-rays to check for a bone spur or fracture.  Draining fluid from the bursa to test it for infection.  Blood tests to rule out gout or rheumatoid arthritis. How is this treated? Treatment for elbow bursitis depends on the cause. Treatment may include:  Medicines. These may include: ? Over-the-counter medicines to relieve pain and inflammation. ? Antibiotic medicines. ? Injections of anti-inflammatory medicines (steroids).  Draining fluid from the bursa.  Wrapping your elbow with a bandage.  Wearing elbow pads. If these treatments do not help, you may need surgery to remove the bursa. Follow these instructions at  home: Medicines  Take over-the-counter and prescription medicines only as told by your health care provider.  If you were prescribed an antibiotic medicine, take it as told by your health care provider. Do not stop taking the antibiotic even if you start to feel better. Managing pain, stiffness, and swelling   If directed, put ice on your elbow: ? Put ice in a plastic bag. ? Place a towel between your skin and the bag. ? Leave the ice on for 20 minutes, 2-3 times a day.  If your bursitis is caused by an injury, rest your elbow and wear your bandage as told by your health care provider.  Use elbow pads or elbow wraps to cushion your elbow as needed. General instructions  Avoid any activities that cause elbow pain. Ask your health care provider what activities are safe for you.  Keep all follow-up visits as told by your health care provider. This is important. Contact a health care provider if you have:  A fever.  Symptoms that do not get better with treatment.  Pain or swelling that: ? Gets worse. ? Goes away and then comes back.  Pus draining from your elbow. Get help right away if you have:  Trouble moving your arm, hand, or fingers. Summary  Elbow bursitis is inflammation of the fluid-filled sac (bursa) between the tip of your elbow bone (olecranon) and your skin.  Treatment for elbow bursitis depends on the cause. It may include medicines to relieve pain and inflammation, antibiotic medicines,  and draining fluid from your elbow.  Contact a health care provider if your symptoms do not get better with treatment, or if your symptoms go away and then come back. This information is not intended to replace advice given to you by your health care provider. Make sure you discuss any questions you have with your health care provider. Document Revised: 09/18/2017 Document Reviewed: 09/15/2017 Elsevier Patient Education  [place].  Bursitis  Bursitis is inflammation  and irritation of a bursa, which is one of the small, fluid-filled sacs that cushion and protect the moving parts of your body. These sacs are located between bones and muscles, bones and muscle attachments, or bones and skin areas that are next to bones. A bursa protects those structures from the wear and tear that results from frequent movement. An inflamed bursa causes pain and swelling. Fluid may build up inside the sac. Bursitis is most common near joints, especially the knees, elbows, hips, and shoulders. What are the causes? This condition may be caused by:  Injury from: ? A direct hit (blow), like falling on your knee or elbow. ? Overuse of a joint (repetitive stress).  Infection. This can happen if bacteria get into a bursa through a cut or scrape near a joint.  Diseases that cause joint inflammation, such as gout and rheumatoid arthritis. What increases the risk? You are more likely to develop this condition if you:  Have a job or hobby that involves a lot of repetitive stress on your joints.  Have a condition that weakens your body's defense system (immune system), such as diabetes, cancer, or HIV.  Do any of these often: ? Lift and reach overhead. ? Kneel or lean on hard surfaces. ? Run or walk. What are the signs or symptoms? The most common symptoms of this condition include:  Pain that gets worse when you move the affected body part or use it to support (bear) your body weight.  Inflammation.  Stiffness. Other symptoms include:  Redness.  Swelling.  Tenderness.  Warmth.  Pain that continues after rest.  Fever or chills. These may occur in bursitis that is caused by infection. How is this diagnosed? This condition may be diagnosed based on:  Your medical history and a physical exam.  Imaging tests, such as an MRI.  A procedure to drain fluid from the bursa with a needle (aspiration). The fluid may be checked for signs of infection or gout.  Blood  tests to rule out other causes of inflammation. How is this treated? This condition can usually be treated at home with rest, ice, applying pressure (compression), and raising the body part that is affected (elevation). This is called RICE therapy. For mild bursitis, RICE therapy may be all you need. Other treatments may include:  NSAIDs to treat pain and inflammation.  Corticosteroid medicines to fight inflammation. These medicines may be injected into and around the area of bursitis.  Aspiration of fluid from the bursa to relieve pain and improve movement.  Antibiotic medicine to treat an infected bursa.  A splint, brace, or walking aid, such as a cane.  Physical therapy if you continue to have pain or limited movement.  Surgery to remove a damaged or infected bursa. This may be needed if other treatments have not worked. Follow these instructions at home: Medicines  Take over-the-counter and prescription medicines only as told by your health care provider.  If you were prescribed an antibiotic medicine, take it as told by your health  care provider. Do not stop taking the antibiotic even if you start to feel better. General instructions   Rest the affected area as told by your health care provider. ? If possible, raise (elevate) the affected area above the level of your heart while you are sitting or lying down. ? Avoid activities that make pain worse.  Use splints, braces, pads, or walking aids as told by your health care provider.  If directed, put ice on the affected area: ? If you have a removable splint or brace, remove it as told by your health care provider. ? Put ice in a plastic bag. ? Place a towel between your skin and the bag or between your splint or brace and the bag. ? Leave the ice on for 20 minutes, 2-3 times a day.  Keep all follow-up visits as told by your health care provider. This is important. Preventing future episodes Take actions to help prevent  future episodes of bursitis.  Wear knee pads if you kneel often.  Wear sturdy running or walking shoes that fit you well.  Take breaks regularly from repetitive activity.  Warm up by stretching before doing any activity that takes a lot of effort.  Maintain a healthy weight or lose weight as recommended by your health care provider. If you need help doing this, ask your health care provider.  Exercise regularly. Start any new physical activity gradually. Contact a health care provider if you:  Have a fever.  Have chills.  Have bursitis that is not getting better with treatment or home care. Summary  Bursitis is inflammation and irritation of a bursa, which is one of the small, fluid-filled sacs that cushion and protect the moving parts of your body.  An inflamed bursa causes pain and swelling.  Bursitis is commonly diagnosed with a physical exam, but other tests are sometimes needed.  This condition can usually be treated at home with rest, ice, applying pressure (compression), and raising the body part that is affected (elevation). This is called RICE therapy. This information is not intended to replace advice given to you by your health care provider. Make sure you discuss any questions you have with your health care provider. Document Revised: 09/18/2017 Document Reviewed: 08/21/2017 Elsevier Patient Education  2020 [employer].

## 2020-03-21 NOTE — Progress Notes (Signed)
Chief Complaint  Patient presents with  . Bursitis    swollen, enlarged left elbow. Pt noticed this last week.    F/u  1. Left elbow swollen ? Duration no trauma but she does rub arm constantly and work in the garden she noticed swelling last  Week with rubbing her elbow 0/10 pain    Review of Systems  Constitutional: Negative for weight loss.  HENT: Negative for hearing loss.   Respiratory: Negative for shortness of breath.   Cardiovascular: Negative for chest pain.  Gastrointestinal: Negative for abdominal pain.  Musculoskeletal: Positive for joint pain.  Skin: Negative for rash.   Past Medical History:  Diagnosis Date  . Anxiety   . Arthritis    spine, prior Pain Clinic patient  . Cervical back pain with evidence of disc disease 2007   prior epidural steorid injection s  . Duodenal ulcer   . Gastric ulcer    H Pylori positive  . GERD (gastroesophageal reflux disease)   . Irritable bowel syndrome (IBS)    Teena Irani, [facility]  . Neuropathy    "toilet seat", Manuella Ghazi), caused  by sitting on commode  . Screening for breast cancer   . Screening for cervical cancer   . Tremor, essential    treated with primidone 25 mg qhs   Past Surgical History:  Procedure Laterality Date  . ABDOMINAL HYSTERECTOMY  1967  . BIOPSY BREAST     benign,. right breast ([place])  . BREAST BIOPSY Right 2012   NEG  . BREAST BIOPSY Left 2007   NEG  . BREAST SURGERY     [place], benign biopsies  . ROTATOR CUFF REPAIR  2004   right shoulder , [place]   Family History  Problem Relation Age of Onset  . Diabetes Mother   . Parkinson's disease Father   . Dementia Father   . Diabetes Son   . Heart block Sister   . Breast cancer Neg Hx    Social History   Socioeconomic History  . Marital status: Married    Spouse name: Not on file  . Number of children: Not on file  . Years of education: Not on file  . Highest education level: Not on file  Occupational History  . Not on file  Tobacco  Use  . Smoking status: Former Smoker    Types: Cigarettes    Quit date: 09/03/1999    Years since quitting: 20.5  . Smokeless tobacco: Never Used  Substance and Sexual Activity  . Alcohol use: Yes    Alcohol/week: 0.0 standard drinks    Comment: OCC  . Drug use: No  . Sexual activity: Never  Other Topics Concern  . Not on file  Social History Narrative  . Not on file   Social Determinants of Health   Financial Resource Strain: Low Risk   . Difficulty of Paying Living Expenses: Not hard at all  Food Insecurity: No Food Insecurity  . Worried About Charity fundraiser in the Last Year: Never true  . Ran Out of Food in the Last Year: Never true  Transportation Needs: No Transportation Needs  . Lack of Transportation (Medical): No  . Lack of Transportation (Non-Medical): No  Physical Activity: Sufficiently Active  . Days of Exercise per Week: 5 days  . Minutes of Exercise per Session: 30 min  Stress: No Stress Concern Present  . Feeling of Stress : Not at all  Social Connections: Unknown  . Frequency of Communication with  Friends and Family: More than three times a week  . Frequency of Social Gatherings with Friends and Family: More than three times a week  . Attends Religious Services: More than 4 times per year  . Active Member of Clubs or Organizations: Yes  . Attends Banker Meetings: More than 4 times per year  . Marital Status: Not on file  Intimate Partner Violence: Not At Risk  . Fear of Current or Ex-Partner: No  . Emotionally Abused: No  . Physically Abused: No  . Sexually Abused: No   Current Meds  Medication Sig  . Biotin 5000 MCG CAPS Take 1 capsule by mouth daily.  . cholecalciferol (VITAMIN D) 1000 units tablet Take 1,000 Units by mouth daily.  . cyanocobalamin 1000 MCG tablet Take 1,000 mcg by mouth daily.  Marland Kitchen estradiol (CLIMARA - DOSED IN MG/24 HR) 0.025 mg/24hr patch APPLY ONE PATCH WEEKLY.  . levothyroxine (SYNTHROID) 50 MCG tablet TAKE 1  TABLET DAILY.  . Multiple Vitamins-Minerals (MULTIVITAMIN ADULT EXTRA C PO) Take by mouth.  Marland Kitchen OVER THE COUNTER MEDICATION CBD Oil 250 mg  . primidone (MYSOLINE) 50 MG tablet Take 250 mg by mouth 2 (two) times daily.   . propranolol (INDERAL) 20 MG tablet Take 20 mg by mouth 2 (two) times daily.    Allergies  Allergen Reactions  . Bee Venom Swelling  . Iodinated Diagnostic Agents Hives and Swelling  . Lactase Other (See Comments)  . Shellfish Allergy   . Wheat Bran     Other reaction(s): Unknown  . Doxycycline Rash   No results found for this or any previous visit (from the past 2160 hour(s)). Objective  Body mass index is 23.13 kg/m. Wt Readings from Last 3 Encounters:  03/21/20 122 lb 6.4 oz (55.5 kg)  12/26/19 121 lb (54.9 kg)  05/27/19 121 lb (54.9 kg)   Temp Readings from Last 3 Encounters:  03/21/20 (!) 97.4 F (36.3 C) (Temporal)  05/27/19 98.1 F (36.7 C) (Oral)  12/24/18 (!) 97.4 F (36.3 C) (Oral)   BP Readings from Last 3 Encounters:  03/21/20 122/80  05/27/19 136/80  12/24/18 118/70   Pulse Readings from Last 3 Encounters:  03/21/20 (!) 53  05/27/19 67  12/24/18 (!) 58    Physical Exam Vitals and nursing note reviewed.  Constitutional:      Appearance: Normal appearance. She is well-developed and well-groomed.  HENT:     Head: Normocephalic and atraumatic.  Eyes:     Conjunctiva/sclera: Conjunctivae normal.     Pupils: Pupils are equal, round, and reactive to light.  Cardiovascular:     Rate and Rhythm: Normal rate and regular rhythm.     Heart sounds: Normal heart sounds. No murmur.  Pulmonary:     Effort: Pulmonary effort is normal.     Breath sounds: Normal breath sounds.  Musculoskeletal:     Left elbow: Swelling, deformity and effusion present.     Comments: Left elbow bursitis   Skin:    General: Skin is warm and dry.  Neurological:     General: No focal deficit present.     Mental Status: She is alert and oriented to person, place,  and time. Mental status is at baseline.     Gait: Gait normal.  Psychiatric:        Attention and Perception: Attention and perception normal.        Mood and Affect: Mood and affect normal.        Speech: Speech  normal.        Behavior: Behavior normal. Behavior is cooperative.        Thought Content: Thought content normal.        Cognition and Memory: Cognition and memory normal.        Judgment: Judgment normal.     Assessment  Plan  Olecranon bursitis of left elbow - Plan: Ambulatory referral to Orthopedic Surgery  Dr. Joice Lofts  F/u with PCP 05/2020 for labs and CPE Provider: Dr. French Ana McLean-Scocuzza-[facility] Medicine

## 2020-04-13 ENCOUNTER — Other Ambulatory Visit: Payer: Self-pay

## 2020-04-13 DIAGNOSIS — E785 Hyperlipidemia, unspecified: Secondary | ICD-10-CM

## 2020-04-13 DIAGNOSIS — E039 Hypothyroidism, unspecified: Secondary | ICD-10-CM

## 2020-04-13 MED ORDER — LEVOTHYROXINE SODIUM 50 MCG PO TABS: 50.0000 ug | ORAL_TABLET | Freq: Every day | ORAL | 0 refills | Status: DC

## 2020-04-13 NOTE — Telephone Encounter (Signed)
Refilled: 03/08/2020 Last OV: 05/27/2019 Next OV: 05/21/2020 Last TSH: 12/24/2018

## 2020-04-13 NOTE — Telephone Encounter (Signed)
LMTCB

## 2020-04-13 NOTE — Telephone Encounter (Signed)
Refill for 30 days only.  Fasting labs  NEEDED prior to any more refills

## 2020-04-13 NOTE — Telephone Encounter (Signed)
Message 2 of 2: she needs to suspend biotin for 3 days prior to labs

## 2020-04-13 NOTE — Telephone Encounter (Signed)
Patient stated she has had an appointment already scheduled for august that she was told to do. She is frustrated that she has to come again when her appointment is already scheduled. She wants to know if she can keep the appointment already made. Please advise.

## 2020-04-17 MED ORDER — LEVOTHYROXINE SODIUM 50 MCG PO TABS: 50.0000 ug | ORAL_TABLET | Freq: Every day | ORAL | 2 refills | Status: DC

## 2020-04-17 NOTE — Telephone Encounter (Signed)
She does not need to have a separate appointment.  She can have her thyroid checked at her august appointment

## 2020-04-17 NOTE — Addendum Note (Signed)
Addended by: Sherlene Shams on: 04/17/2020 08:36 PM   Modules accepted: Orders

## 2020-04-20 ENCOUNTER — Telehealth: Payer: Self-pay | Admitting: Internal Medicine

## 2020-04-20 NOTE — Telephone Encounter (Signed)
Called patient, LVM asking the patient to call back to schedule an appointment, 03/27/20 at 11:42 AM." [facility] said 24 days ago  "Patient is better now. She said, "her swelling has gone down and it looks like her other one." I told her she is welcome to call us if she needs to be seen in the future. 04/19/20." [facility] said about 22 hours ago  "Rejection Reason - Other - Patient does not need to be seen anymore. Her elbow is better." [facility] said about 22 hours ago

## 2020-05-21 ENCOUNTER — Ambulatory Visit: Payer: Medicare Other | Admitting: Internal Medicine

## 2020-05-21 ENCOUNTER — Other Ambulatory Visit (INDEPENDENT_AMBULATORY_CARE_PROVIDER_SITE_OTHER): Payer: Medicare Other

## 2020-05-21 ENCOUNTER — Other Ambulatory Visit: Payer: Self-pay

## 2020-05-21 ENCOUNTER — Other Ambulatory Visit: Payer: Medicare Other

## 2020-05-21 DIAGNOSIS — R944 Abnormal results of kidney function studies: Secondary | ICD-10-CM

## 2020-05-21 DIAGNOSIS — E039 Hypothyroidism, unspecified: Secondary | ICD-10-CM | POA: Diagnosis not present

## 2020-05-21 DIAGNOSIS — E785 Hyperlipidemia, unspecified: Secondary | ICD-10-CM | POA: Diagnosis not present

## 2020-05-21 LAB — COMPREHENSIVE METABOLIC PANEL
ALT: 12 U/L (ref 0–35)
AST: 14 U/L (ref 0–37)
Albumin: 4.1 g/dL (ref 3.5–5.2)
Alkaline Phosphatase: 96 U/L (ref 39–117)
BUN: 14 mg/dL (ref 6–23)
CO2: 29 mEq/L (ref 19–32)
Calcium: 9.4 mg/dL (ref 8.4–10.5)
Chloride: 103 mEq/L (ref 96–112)
Creatinine, Ser: 0.8 mg/dL (ref 0.40–1.20)
GFR: 69.05 mL/min (ref >60.00)
Glucose, Bld: 99 mg/dL (ref 70–99)
Potassium: 4.6 mEq/L (ref 3.5–5.1)
Sodium: 138 mEq/L (ref 135–145)
Total Bilirubin: 0.3 mg/dL (ref 0.2–1.2)
Total Protein: 6.7 g/dL (ref 6.0–8.3)

## 2020-05-21 LAB — LIPID PANEL
Cholesterol: 271 mg/dL — ABNORMAL HIGH (ref 0–200)
HDL: 85.1 mg/dL (ref >39.00)
LDL Cholesterol: 170 mg/dL — ABNORMAL HIGH (ref 0–99)
NonHDL: 186.29
Total CHOL/HDL Ratio: 3
Triglycerides: 79 mg/dL (ref 0.0–149.0)
VLDL: 15.8 mg/dL (ref 0.0–40.0)

## 2020-05-21 LAB — BASIC METABOLIC PANEL
BUN: 14 mg/dL (ref 6–23)
CO2: 29 mEq/L (ref 19–32)
Calcium: 9.4 mg/dL (ref 8.4–10.5)
Chloride: 103 mEq/L (ref 96–112)
Creatinine, Ser: 0.8 mg/dL (ref 0.40–1.20)
GFR: 69.05 mL/min (ref >60.00)
Glucose, Bld: 99 mg/dL (ref 70–99)
Potassium: 4.6 mEq/L (ref 3.5–5.1)
Sodium: 138 mEq/L (ref 135–145)

## 2020-05-21 LAB — TSH: TSH: 3.24 u[IU]/mL (ref 0.35–4.50)

## 2020-06-19 ENCOUNTER — Other Ambulatory Visit: Payer: Self-pay | Admitting: Internal Medicine

## 2020-07-23 ENCOUNTER — Other Ambulatory Visit: Payer: Self-pay | Admitting: Internal Medicine

## 2020-07-23 DIAGNOSIS — Z1231 Encounter for screening mammogram for malignant neoplasm of breast: Secondary | ICD-10-CM

## 2020-07-25 ENCOUNTER — Other Ambulatory Visit: Payer: Self-pay | Admitting: Internal Medicine

## 2020-07-25 DIAGNOSIS — Z23 Encounter for immunization: Secondary | ICD-10-CM | POA: Diagnosis not present

## 2020-07-31 ENCOUNTER — Ambulatory Visit: Payer: 59 | Admitting: Internal Medicine

## 2020-08-22 ENCOUNTER — Other Ambulatory Visit: Payer: Self-pay

## 2020-08-22 ENCOUNTER — Ambulatory Visit
Admission: RE | Admit: 2020-08-22 | Discharge: 2020-08-22 | Disposition: A | Discharge status: Home / Self Care | Payer: Medicare Other | Source: Ambulatory Visit | Attending: Internal Medicine | Admitting: Internal Medicine

## 2020-08-22 DIAGNOSIS — Z1231 Encounter for screening mammogram for malignant neoplasm of breast: Secondary | ICD-10-CM | POA: Insufficient documentation

## 2020-08-24 ENCOUNTER — Other Ambulatory Visit: Payer: Self-pay

## 2020-08-24 ENCOUNTER — Encounter: Payer: Self-pay | Admitting: Internal Medicine

## 2020-08-24 ENCOUNTER — Ambulatory Visit (INDEPENDENT_AMBULATORY_CARE_PROVIDER_SITE_OTHER): Payer: Medicare Other | Admitting: Internal Medicine

## 2020-08-24 ENCOUNTER — Ambulatory Visit: Payer: 59 | Admitting: Internal Medicine

## 2020-08-24 DIAGNOSIS — E785 Hyperlipidemia, unspecified: Secondary | ICD-10-CM | POA: Diagnosis not present

## 2020-08-24 DIAGNOSIS — R030 Elevated blood-pressure reading, without diagnosis of hypertension: Secondary | ICD-10-CM | POA: Diagnosis not present

## 2020-08-24 DIAGNOSIS — G250 Essential tremor: Secondary | ICD-10-CM | POA: Diagnosis not present

## 2020-08-24 DIAGNOSIS — Z8616 Personal history of COVID-19: Secondary | ICD-10-CM

## 2020-08-24 NOTE — Assessment & Plan Note (Addendum)
Her ten year risk of CAD /CVA is 33% based on most recent lipid profile applied to the [facility] calculator.  Statin advised.  She remains contemplative.  Lab Results  Component Value Date   CHOL 271 (H) 05/21/2020   HDL 85.10 05/21/2020   LDLCALC 170 (H) 05/21/2020   LDLDIRECT 137.0 12/22/2017   TRIG 79.0 05/21/2020   CHOLHDL 3 05/21/2020

## 2020-08-24 NOTE — Progress Notes (Signed)
Subjective:  Patient ID: Vanessa Brown, female    DOB: [birth year]  Age: 79 y.o. MRN: [id number]  CC: Diagnoses of Hyperlipidemia LDL goal <130, History of JHERD-40, Benign essential tremor, and Elevated blood pressure reading in office without diagnosis of hypertension were pertinent to this visit.  HPI Vanessa Brown presents for follow up on hypertension,  Hypothyroidism and tremor  This visit occurred during the SARS-CoV-2 public health emergency.  Safety protocols were in place, including screening questions prior to the visit, additional usage of staff PPE, and extensive cleaning of exam room while observing appropriate contact time as indicated for disinfecting solutions.    Patient has received both doses of the available COVID 19 vaccine without complications.  Patient continues to mask when outside of the home except when walking in yard or at safe distances from others .  Patient denies any change in mood or development of unhealthy behaviors resuting from the pandemic's restriction of activities and socialization.    Hyperlipidemia: reviewed her risk of CAD/CVA which is 33% using the [facility] calculator.  She remains ambivalent and fearful of statin drugs despite no previous trial.   Tremor:   Has been taking primidone for over a decade ,  No difference with addition of propranol prescribed by Neurology  For tremor  High BP today.  Reading at home is taken infrequently; last check was  Over one month ago 118 to 122 systolic   Had COVID 3.5 WEEKS AGO : sore throat, headache and slight fever.  Thought her symptoms were due to flu vaccine . Quarantined for 14 days.  Symptoms resolved.     Outpatient Medications Prior to Visit  Medication Sig Dispense Refill  . Biotin 5000 MCG CAPS Take 1 capsule by mouth daily.    . cholecalciferol (VITAMIN D) 1000 units tablet Take 1,000 Units by mouth daily.    . cyanocobalamin 1000 MCG tablet Take 1,000 mcg by mouth daily.    Marland Kitchen estradiol (CLIMARA -  DOSED IN MG/24 HR) 0.025 mg/24hr patch APPLY ONE PATCH WEEKLY. 4 patch 0  . levothyroxine (SYNTHROID) 50 MCG tablet Take 1 tablet (50 mcg total) by mouth daily. 30 tablet 2  . Multiple Vitamins-Minerals (MULTIVITAMIN ADULT EXTRA C PO) Take by mouth.    . primidone (MYSOLINE) 50 MG tablet Take 250 mg by mouth 2 (two) times daily.     . propranolol (INDERAL) 40 MG tablet Take 40 mg by mouth 2 (two) times daily.    Marland Kitchen OVER THE COUNTER MEDICATION CBD Oil 250 mg (Patient not taking: Reported on 08/24/2020)    . propranolol (INDERAL) 20 MG tablet Take 20 mg by mouth 2 (two) times daily.      No facility-administered medications prior to visit.    Review of Systems;  Patient denies headache, fevers, malaise, unintentional weight loss, skin rash, eye pain, sinus congestion and sinus pain, sore throat, dysphagia,  hemoptysis , cough, dyspnea, wheezing, chest pain, palpitations, orthopnea, edema, abdominal pain, nausea, melena, diarrhea, constipation, flank pain, dysuria, hematuria, urinary  Frequency, nocturia, numbness, tingling, seizures,  Focal weakness, Loss of consciousness,  Tremor, insomnia, depression, anxiety, and suicidal ideation.      Objective:  BP 118/82 (BP Location: Right Arm, Patient Position: Sitting, Cuff Size: Normal)   Pulse 71   Temp 98.1 F (36.7 C) (Oral)   Resp 16   Ht 5\' 1"  (1.549 m)   Wt 125 lb 12.8 oz (57.1 kg)   SpO2 99%   BMI 23.77 kg/m  BP Readings from Last 3 Encounters:  08/24/20 118/82  03/21/20 122/80  05/27/19 136/80    Wt Readings from Last 3 Encounters:  08/24/20 125 lb 12.8 oz (57.1 kg)  03/21/20 122 lb 6.4 oz (55.5 kg)  12/26/19 121 lb (54.9 kg)    General appearance: alert, cooperative and appears stated age Ears: normal TM's and external ear canals both ears Throat: lips, mucosa, and tongue normal; teeth and gums normal Neck: no adenopathy, no carotid bruit, supple, symmetrical, trachea midline and thyroid not enlarged, symmetric, no  tenderness/mass/nodules Back: symmetric, no curvature. ROM normal. No CVA tenderness. Lungs: clear to auscultation bilaterally Heart: regular rate and rhythm, S1, S2 normal, no murmur, click, rub or gallop Abdomen: soft, non-tender; bowel sounds normal; no masses,  no organomegaly Pulses: 2+ and symmetric Skin: Skin color, texture, turgor normal. No rashes or lesions Lymph nodes: Cervical, supraclavicular, and axillary nodes normal.  No results found for: HGBA1C  Lab Results  Component Value Date   CREATININE 0.80 05/21/2020   CREATININE 0.80 05/21/2020   CREATININE 1.05 (H) 05/27/2019    Lab Results  Component Value Date   WBC 9.4 08/05/2018   HGB 13.2 08/05/2018   HCT 39.3 08/05/2018   PLT 412.0 (H) 08/05/2018   GLUCOSE 99 05/21/2020   GLUCOSE 99 05/21/2020   CHOL 271 (H) 05/21/2020   TRIG 79.0 05/21/2020   HDL 85.10 05/21/2020   LDLDIRECT 137.0 12/22/2017   LDLCALC 170 (H) 05/21/2020   ALT 12 05/21/2020   AST 14 05/21/2020   NA 138 05/21/2020   NA 138 05/21/2020   K 4.6 05/21/2020   K 4.6 05/21/2020   CL 103 05/21/2020   CL 103 05/21/2020   CREATININE 0.80 05/21/2020   CREATININE 0.80 05/21/2020   BUN 14 05/21/2020   BUN 14 05/21/2020   CO2 29 05/21/2020   CO2 29 05/21/2020   TSH 3.24 05/21/2020    MM 3D SCREEN BREAST BILATERAL  Result Date: 08/23/2020 CLINICAL DATA:  Screening. EXAM: DIGITAL SCREENING BILATERAL MAMMOGRAM WITH TOMO AND CAD COMPARISON:  Previous exam(s). ACR Breast Density Category c: The breast tissue is heterogeneously dense, which may obscure small masses. FINDINGS: There are no findings suspicious for malignancy. Images were processed with CAD. IMPRESSION: No mammographic evidence of malignancy. A result letter of this screening mammogram will be mailed directly to the patient. RECOMMENDATION: Screening mammogram in one year. (Code:SM-B-01Y) BI-RADS CATEGORY  1: Negative. Electronically Signed   By: Amie Portland M.D.   On: 08/23/2020 15:30     Assessment & Plan:   Problem List Items Addressed This Visit      Unprioritized   Benign essential tremor    No significant change with addition of propranolol to primidone.      Elevated blood pressure reading in office without diagnosis of hypertension    She has no history of hypertension but has had several elevated readings.  She has been periodically checking  her pressures at home and submitted readings for evaluation, which have been normal  Lab Results  Component Value Date   CREATININE 0.80 05/21/2020   CREATININE 0.80 05/21/2020   Lab Results  Component Value Date   NA 138 05/21/2020   NA 138 05/21/2020   K 4.6 05/21/2020   K 4.6 05/21/2020   CL 103 05/21/2020   CL 103 05/21/2020   CO2 29 05/21/2020   CO2 29 05/21/2020   .   Marland Kitchen      History of COVID-19  Case occurred after vaccination ,  Mild  Resolved at home.       Hyperlipidemia LDL goal <130    Her ten year risk of CAD /CVA is 33% based on most recent lipid profile applied to the [facility] calculator.  Statin advised.  She remains contemplative.  Lab Results  Component Value Date   CHOL 271 (H) 05/21/2020   HDL 85.10 05/21/2020   LDLCALC 170 (H) 05/21/2020   LDLDIRECT 137.0 12/22/2017   TRIG 79.0 05/21/2020   CHOLHDL 3 05/21/2020         Relevant Medications   propranolol (INDERAL) 40 MG tablet      I have discontinued [place]. Diekmann's OVER THE COUNTER MEDICATION. I am also having her maintain her primidone, cyanocobalamin, Biotin, Multiple Vitamins-Minerals (MULTIVITAMIN ADULT EXTRA C PO), cholecalciferol, levothyroxine, estradiol, and propranolol.  No orders of the defined types were placed in this encounter.   Medications Discontinued During This Encounter  Medication Reason  . OVER THE COUNTER MEDICATION   . propranolol (INDERAL) 20 MG tablet     Follow-up: Return in about 6 months (around 02/21/2021).   Sherlene Shams, MD

## 2020-08-24 NOTE — Patient Instructions (Signed)
Based on your current elevated risk of heart attack and stroke,  I recommend that you consider starting Lipitor or Crestor to lower your risk  If you decide to try one, call the office and let me know   WE will monitor your liver enyzmes closely if you start this medication

## 2020-08-24 NOTE — Assessment & Plan Note (Signed)
Case occurred after vaccination ,  Mild  Resolved at home.

## 2020-08-26 ENCOUNTER — Encounter: Payer: Self-pay | Admitting: Internal Medicine

## 2020-08-26 NOTE — Assessment & Plan Note (Signed)
She has no history of hypertension but has had several elevated readings.  She has been periodically checking  her pressures at home and submitted readings for evaluation, which have been normal  Lab Results  Component Value Date   CREATININE 0.80 05/21/2020   CREATININE 0.80 05/21/2020   Lab Results  Component Value Date   NA 138 05/21/2020   NA 138 05/21/2020   K 4.6 05/21/2020   K 4.6 05/21/2020   CL 103 05/21/2020   CL 103 05/21/2020   CO2 29 05/21/2020   CO2 29 05/21/2020   .   Marland Kitchen

## 2020-08-26 NOTE — Assessment & Plan Note (Signed)
No significant change with addition of propranolol to primidone.

## 2020-08-30 ENCOUNTER — Other Ambulatory Visit: Payer: Self-pay | Admitting: Internal Medicine

## 2020-09-24 ENCOUNTER — Other Ambulatory Visit: Payer: Self-pay | Admitting: Internal Medicine

## 2020-09-24 DIAGNOSIS — E039 Hypothyroidism, unspecified: Secondary | ICD-10-CM

## 2020-09-25 DIAGNOSIS — H2513 Age-related nuclear cataract, bilateral: Secondary | ICD-10-CM | POA: Diagnosis not present

## 2020-09-25 DIAGNOSIS — H0288B Meibomian gland dysfunction left eye, upper and lower eyelids: Secondary | ICD-10-CM | POA: Diagnosis not present

## 2020-09-25 DIAGNOSIS — H0288A Meibomian gland dysfunction right eye, upper and lower eyelids: Secondary | ICD-10-CM | POA: Diagnosis not present

## 2020-09-25 DIAGNOSIS — H5203 Hypermetropia, bilateral: Secondary | ICD-10-CM | POA: Diagnosis not present

## 2020-10-24 ENCOUNTER — Other Ambulatory Visit: Payer: Self-pay | Admitting: Internal Medicine

## 2020-10-24 DIAGNOSIS — E039 Hypothyroidism, unspecified: Secondary | ICD-10-CM

## 2020-11-02 ENCOUNTER — Other Ambulatory Visit: Payer: Self-pay | Admitting: Internal Medicine

## 2020-11-28 ENCOUNTER — Other Ambulatory Visit: Payer: Self-pay | Admitting: Internal Medicine

## 2020-11-28 DIAGNOSIS — E039 Hypothyroidism, unspecified: Secondary | ICD-10-CM

## 2020-11-30 ENCOUNTER — Other Ambulatory Visit: Payer: Self-pay | Admitting: Internal Medicine

## 2020-12-26 ENCOUNTER — Ambulatory Visit: Payer: Medicare Other

## 2021-01-07 ENCOUNTER — Other Ambulatory Visit: Payer: Self-pay | Admitting: Internal Medicine

## 2021-01-07 DIAGNOSIS — E039 Hypothyroidism, unspecified: Secondary | ICD-10-CM

## 2021-02-06 ENCOUNTER — Other Ambulatory Visit: Payer: Self-pay | Admitting: Internal Medicine

## 2021-02-07 ENCOUNTER — Other Ambulatory Visit: Payer: Self-pay | Admitting: Internal Medicine

## 2021-02-07 DIAGNOSIS — E039 Hypothyroidism, unspecified: Secondary | ICD-10-CM

## 2021-02-12 ENCOUNTER — Ambulatory Visit (INDEPENDENT_AMBULATORY_CARE_PROVIDER_SITE_OTHER): Payer: Medicare Other

## 2021-02-12 VITALS — Ht 61.0 in | Wt 125.0 lb

## 2021-02-12 DIAGNOSIS — Z0000 Encounter for general adult medical examination without abnormal findings: Secondary | ICD-10-CM | POA: Diagnosis not present

## 2021-02-12 NOTE — Patient Instructions (Addendum)
Vanessa Brown , Thank you for taking time to come for your Medicare Wellness Visit. I appreciate your ongoing commitment to your health goals. Please review the following plan we discussed and let me know if I can assist you in the future.   These are the goals we discussed: Goals    . Follow up with Primary Care Provider     As needed       This is a list of the screening recommended for you and due dates:  Health Maintenance  Topic Date Due  . COVID-19 Vaccine (3 - Booster for Pfizer series) 03/05/2021*  . Tetanus Vaccine  02/12/2022*  . Flu Shot  05/20/2021  . DEXA scan (bone density measurement)  Completed  . Pneumonia vaccines  Completed  . HPV Vaccine  Aged Out  . Colon Cancer Screening  Discontinued  *Topic was postponed. The date shown is not the original due date.    Immunizations Immunization History  Administered Date(s) Administered  . Influenza Split 08/25/2011, 07/24/2014  . Influenza-Unspecified 08/03/2012, 07/25/2015, 08/05/2016, 08/03/2017, 12/22/2017, 07/22/2018, 08/03/2020  . PFIZER(Purple Top)SARS-COV-2 Vaccination 11/23/2019, 12/18/2019  . Pneumococcal Conjugate-13 11/22/2014  . Pneumococcal Polysaccharide-23 11/21/2006   Advanced directives: End of life planning; Advance aging; Advanced directives discussed.  Copy of current HCPOA/Living Will requested.    Conditions/risks identified: none new.   Follow up in one year for your annual wellness visit.   Preventive Care 7 Years and Older, Female Preventive care refers to lifestyle choices and visits with your health care provider that can promote health and wellness. What does preventive care include?  A yearly physical exam. This is also called an annual well check.  Dental exams once or twice a year.  Routine eye exams. Ask your health care provider how often you should have your eyes checked.  Personal lifestyle choices, including:  Daily care of your teeth and gums.  Regular physical  activity.  Eating a healthy diet.  Avoiding tobacco and drug use.  Limiting alcohol use.  Practicing safe sex.  Taking low-dose aspirin every day.  Taking vitamin and mineral supplements as recommended by your health care provider. What happens during an annual well check? The services and screenings done by your health care provider during your annual well check will depend on your age, overall health, lifestyle risk factors, and family history of disease. Counseling  Your health care provider may ask you questions about your:  Alcohol use.  Tobacco use.  Drug use.  Emotional well-being.  Home and relationship well-being.  Sexual activity.  Eating habits.  History of falls.  Memory and ability to understand (cognition).  Work and work Astronomer.  Reproductive health. Screening  You may have the following tests or measurements:  Height, weight, and BMI.  Blood pressure.  Lipid and cholesterol levels. These may be checked every 5 years, or more frequently if you are over 11 years old.  Skin check.  Lung cancer screening. You may have this screening every year starting at age 71 if you have a 30-pack-year history of smoking and currently smoke or have quit within the past 15 years.  Fecal occult blood test (FOBT) of the stool. You may have this test every year starting at age 58.  Flexible sigmoidoscopy or colonoscopy. You may have a sigmoidoscopy every 5 years or a colonoscopy every 10 years starting at age 36.  Hepatitis C blood test.  Hepatitis B blood test.  Sexually transmitted disease (STD) testing.  Diabetes screening. This is  done by checking your blood sugar (glucose) after you have not eaten for a while (fasting). You may have this done every 1-3 years.  Bone density scan. This is done to screen for osteoporosis. You may have this done starting at age 25.  Mammogram. This may be done every 1-2 years. Talk to your health care provider about  how often you should have regular mammograms. Talk with your health care provider about your test results, treatment options, and if necessary, the need for more tests. Vaccines  Your health care provider may recommend certain vaccines, such as:  Influenza vaccine. This is recommended every year.  Tetanus, diphtheria, and acellular pertussis (Tdap, Td) vaccine. You may need a Td booster every 10 years.  Zoster vaccine. You may need this after age 29.  Pneumococcal 13-valent conjugate (PCV13) vaccine. One dose is recommended after age 40.  Pneumococcal polysaccharide (PPSV23) vaccine. One dose is recommended after age 28. Talk to your health care provider about which screenings and vaccines you need and how often you need them. This information is not intended to replace advice given to you by your health care provider. Make sure you discuss any questions you have with your health care provider. Document Released: 11/02/2015 Document Revised: 06/25/2016 Document Reviewed: 08/07/2015 Elsevier Interactive Patient Education  2017 [place] Prevention in the Home Falls can cause injuries. They can happen to people of all ages. There are many things you can do to make your home safe and to help prevent falls. What can I do on the outside of my home?  Regularly fix the edges of walkways and driveways and fix any cracks.  Remove anything that might make you trip as you walk through a door, such as a raised step or threshold.  Trim any bushes or trees on the path to your home.  Use bright outdoor lighting.  Clear any walking paths of anything that might make someone trip, such as rocks or tools.  Regularly check to see if handrails are loose or broken. Make sure that both sides of any steps have handrails.  Any raised decks and porches should have guardrails on the edges.  Have any leaves, snow, or ice cleared regularly.  Use sand or salt on walking paths during  winter.  Clean up any spills in your garage right away. This includes oil or grease spills. What can I do in the bathroom?  Use night lights.  Install grab bars by the toilet and in the tub and shower. Do not use towel bars as grab bars.  Use non-skid mats or decals in the tub or shower.  If you need to sit down in the shower, use a plastic, non-slip stool.  Keep the floor dry. Clean up any water that spills on the floor as soon as it happens.  Remove soap buildup in the tub or shower regularly.  Attach bath mats securely with double-sided non-slip rug tape.  Do not have throw rugs and other things on the floor that can make you trip. What can I do in the bedroom?  Use night lights.  Make sure that you have a light by your bed that is easy to reach.  Do not use any sheets or blankets that are too big for your bed. They should not hang down onto the floor.  Have a firm chair that has side arms. You can use this for support while you get dressed.  Do not have throw rugs and other things  on the floor that can make you trip. What can I do in the kitchen?  Clean up any spills right away.  Avoid walking on wet floors.  Keep items that you use a lot in easy-to-reach places.  If you need to reach something above you, use a strong step stool that has a grab bar.  Keep electrical cords out of the way.  Do not use floor polish or wax that makes floors slippery. If you must use wax, use non-skid floor wax.  Do not have throw rugs and other things on the floor that can make you trip. What can I do with my stairs?  Do not leave any items on the stairs.  Make sure that there are handrails on both sides of the stairs and use them. Fix handrails that are broken or loose. Make sure that handrails are as long as the stairways.  Check any carpeting to make sure that it is firmly attached to the stairs. Fix any carpet that is loose or worn.  Avoid having throw rugs at the top or  bottom of the stairs. If you do have throw rugs, attach them to the floor with carpet tape.  Make sure that you have a light switch at the top of the stairs and the bottom of the stairs. If you do not have them, ask someone to add them for you. What else can I do to help prevent falls?  Wear shoes that:  Do not have high heels.  Have rubber bottoms.  Are comfortable and fit you well.  Are closed at the toe. Do not wear sandals.  If you use a stepladder:  Make sure that it is fully opened. Do not climb a closed stepladder.  Make sure that both sides of the stepladder are locked into place.  Ask someone to hold it for you, if possible.  Clearly mark and make sure that you can see:  Any grab bars or handrails.  First and last steps.  Where the edge of each step is.  Use tools that help you move around (mobility aids) if they are needed. These include:  Canes.  Walkers.  Scooters.  Crutches.  Turn on the lights when you go into a dark area. Replace any light bulbs as soon as they burn out.  Set up your furniture so you have a clear path. Avoid moving your furniture around.  If any of your floors are uneven, fix them.  If there are any pets around you, be aware of where they are.  Review your medicines with your doctor. Some medicines can make you feel dizzy. This can increase your chance of falling. Ask your doctor what other things that you can do to help prevent falls. This information is not intended to replace advice given to you by your health care provider. Make sure you discuss any questions you have with your health care provider. Document Released: 08/02/2009 Document Revised: 03/13/2016 Document Reviewed: 11/10/2014 Elsevier Interactive Patient Education  2017 [employer].

## 2021-02-12 NOTE — Progress Notes (Addendum)
Subjective:   Vanessa Brown is a 80 y.o. female who presents for Medicare Annual (Subsequent) preventive examination.  Review of Systems    No ROS.  Medicare Wellness Virtual Visit.    Cardiac Risk Factors include: advanced age (>2555men, 70>65 women)     Objective:    Today's Vitals   02/12/21 1038  Weight: 125 lb (56.7 kg)  Height: 5\' 1"  (1.549 m)   Body mass index is 23.62 kg/m.  Advanced Directives 02/12/2021 12/26/2019 12/24/2018 12/22/2017 12/15/2016  Does Patient Have a Medical Advance Directive? Yes Yes Yes Yes Yes  Type of Estate agentAdvance Directive Healthcare Power of [place]orney;Living will Healthcare Power of [place];Living will Healthcare Power of [place]orney;Living will Healthcare Power of [place]Attorney;Living will Healthcare Power of [place]orney;Living will  Does patient want to make changes to medical advance directive? No - Patient declined No - Patient declined No - Patient declined No - Patient declined No - Patient declined  Copy of Healthcare Power of Attorney in Chart? No - copy requested No - copy requested No - copy requested No - copy requested No - copy requested    Current Medications (verified) Outpatient Encounter Medications as of 02/12/2021  Medication Sig  . Biotin 5000 MCG CAPS Take 1 capsule by mouth daily.  . cholecalciferol (VITAMIN D) 1000 units tablet Take 1,000 Units by mouth daily.  . cyanocobalamin 1000 MCG tablet Take 1,000 mcg by mouth daily.  Marland Kitchen. estradiol (CLIMARA - DOSED IN MG/24 HR) 0.025 mg/24hr patch APPLY ONE PATCH WEEKLY.  . levothyroxine (SYNTHROID) 50 MCG tablet Take 1 tablet (50 mcg total) by mouth daily.  . Multiple Vitamins-Minerals (MULTIVITAMIN ADULT EXTRA C PO) Take by mouth.  . primidone (MYSOLINE) 50 MG tablet Take 250 mg by mouth 2 (two) times daily.   . propranolol (INDERAL) 40 MG tablet Take 40 mg by mouth 2 (two) times daily.   No facility-administered encounter medications on file as of 02/12/2021.    Allergies (verified) Bee venom, Iodinated  diagnostic agents, Lactase, Shellfish allergy, Wheat bran, and Doxycycline   History: Past Medical History:  Diagnosis Date  . Anxiety   . Arthritis    spine, prior Pain Clinic patient  . Cervical back pain with evidence of disc disease 2007   prior epidural steorid injection s  . Duodenal ulcer   . Gastric ulcer    H Pylori positive  . GERD (gastroesophageal reflux disease)   . Irritable bowel syndrome (IBS)    Dorena CookeyJohn [facility], [facility]  . Neuropathy    "toilet seat", Sherryll Burger([place]), caused  by sitting on commode  . Screening for breast cancer   . Screening for cervical cancer   . Tremor, essential    treated with primidone 25 mg qhs   Past Surgical History:  Procedure Laterality Date  . ABDOMINAL HYSTERECTOMY  1967  . BIOPSY BREAST     benign,. right breast ([place])  . BREAST BIOPSY Right 2012   NEG  . BREAST BIOPSY Left 2007   NEG  . BREAST SURGERY     [place], benign biopsies  . ROTATOR CUFF REPAIR  2004   right shoulder , [place]   Family History  Problem Relation Age of Onset  . Diabetes Mother   . Parkinson's disease Father   . Dementia Father   . Diabetes Son   . Heart block Sister   . Breast cancer Neg Hx    Social History   Socioeconomic History  . Marital status: Married    Spouse name:  Not on file  . Number of children: Not on file  . Years of education: Not on file  . Highest education level: Not on file  Occupational History  . Not on file  Tobacco Use  . Smoking status: Former Smoker    Types: Cigarettes    Quit date: 09/03/1999    Years since quitting: 21.4  . Smokeless tobacco: Never Used  Vaping Use  . Vaping Use: Never used  Substance and Sexual Activity  . Alcohol use: Yes    Alcohol/week: 0.0 standard drinks    Comment: OCC  . Drug use: No  . Sexual activity: Never  Other Topics Concern  . Not on file  Social History Narrative  . Not on file   Social Determinants of Health   Financial Resource Strain: Low Risk   . Difficulty of  Paying Living Expenses: Not hard at all  Food Insecurity: No Food Insecurity  . Worried About Programme researcher, broadcasting/film/video in the Last Year: Never true  . Ran Out of Food in the Last Year: Never true  Transportation Needs: No Transportation Needs  . Lack of Transportation (Medical): No  . Lack of Transportation (Non-Medical): No  Physical Activity: Sufficiently Active  . Days of Exercise per Week: 5 days  . Minutes of Exercise per Session: 30 min  Stress: No Stress Concern Present  . Feeling of Stress : Not at all  Social Connections: Moderately Integrated  . Frequency of Communication with Friends and Family: More than three times a week  . Frequency of Social Gatherings with Friends and Family: More than three times a week  . Attends Religious Services: More than 4 times per year  . Active Member of Clubs or Organizations: Yes  . Attends Banker Meetings: More than 4 times per year  . Marital Status: Widowed    Tobacco Counseling Counseling given: Not Answered   Clinical Intake:  Pre-visit preparation completed: Yes        Diabetes: No  How often do you need to have someone help you when you read instructions, pamphlets, or other written materials from your doctor or pharmacy?: 1 - Never   Interpreter Needed?: No      Activities of Daily Living In your present state of health, do you have any difficulty performing the following activities: 02/12/2021  Hearing? N  Vision? N  Difficulty concentrating or making decisions? N  Walking or climbing stairs? N  Dressing or bathing? N  Doing errands, shopping? N  Preparing Food and eating ? N  Using the Toilet? N  In the past six months, have you accidently leaked urine? N  Do you have problems with loss of bowel control? N  Managing your Medications? N  Managing your Finances? N  Housekeeping or managing your Housekeeping? N  Some recent data might be hidden    Patient Care Team: Sherlene Shams, MD as PCP -  General (Internal Medicine)  Indicate any recent Medical Services you may have received from other than [facility] providers in the past year (date may be approximate).     Assessment:   This is a routine wellness examination for Raenah.  I connected with Gabrial today by telephone and verified that I am speaking with the correct person using two identifiers. Location patient: home Location provider: work Persons participating in the virtual visit: patient, Engineer, civil (consulting).    I discussed the limitations, risks, security and privacy concerns of performing an evaluation and management service by  telephone and the availability of in person appointments. The patient expressed understanding and verbally consented to this telephonic visit.    Interactive audio and video telecommunications were attempted between this provider and patient, however failed, due to patient having technical difficulties OR patient did not have access to video capability.  We continued and completed visit with audio only.  Some vital signs may be absent or patient reported.   Hearing/Vision screen  Hearing Screening   125Hz  250Hz  500Hz  1000Hz  2000Hz  3000Hz  4000Hz  6000Hz  8000Hz   Right ear:           Left ear:           Comments: Patient is able to hear conversational tones without difficulty.  No issues reported.  Vision Screening Comments: Followed by Dr.  Wears corrective lenses  Annual visits  Virtual visit They have regular follow up with the ophthalmologist  Dietary issues and exercise activities discussed: Current Exercise Habits: Home exercise routine, Time (Minutes): 60, Frequency (Times/Week): 4, Weekly Exercise (Minutes/Week): 240, Intensity: Mild  Regular diet Good water intake  Goals    . Follow up with Primary Care Provider     As needed      Depression Screen PHQ 2/9 Scores 02/12/2021 12/26/2019 12/26/2019 12/24/2018 12/22/2017 12/15/2016 12/14/2015  PHQ - 2 Score 0 0 0 0 0 0 0    Fall Risk Fall Risk   02/12/2021 08/24/2020 03/21/2020 12/26/2019 12/24/2018  Falls in the past year? 0 0 0 0 0  Number falls in past yr: 0 - 0 - -  Injury with Fall? 0 - 0 - -  Follow up Falls evaluation completed Falls evaluation completed Falls evaluation completed Falls evaluation completed -    FALL RISK PREVENTION PERTAINING TO THE HOME: Handrails in use wen climbing stairs? Yes Home free of loose throw rugs in walkways, pet beds, electrical cords, etc? Yes  Adequate lighting in your home to reduce risk of falls? Yes   ASSISTIVE DEVICES UTILIZED TO PREVENT FALLS: Life alert? No  Use of a cane, walker or w/c? No  Grab bars in the bathroom? No  Shower chair or bench in shower? No  Elevated toilet seat or a handicapped toilet? No   TIMED UP AND GO: Was the test performed? No . Virtual visit.   Cognitive Function:  Patient is alert and oriented x3.   Denies difficulty focusing, making decisions, memory loss.  Enjoys brain health activities.   6CIT Screen 02/12/2021 12/26/2019 12/24/2018 12/22/2017 12/15/2016  What Year? 0 points 0 points 0 points 0 points 0 points  What month? 0 points 0 points 0 points 0 points 0 points  What time? 0 points 0 points 0 points 0 points 0 points  Count back from 20 0 points 0 points 0 points 0 points 0 points  Months in reverse - 0 points 0 points 0 points 0 points  Repeat phrase - - 0 points 0 points 0 points  Total Score - - 0 0 0    Immunizations Immunization History  Administered Date(s) Administered  . Influenza Split 08/25/2011, 07/24/2014  . Influenza-Unspecified 08/03/2012, 07/25/2015, 08/05/2016, 08/03/2017, 12/22/2017, 07/22/2018, 08/03/2020  . PFIZER(Purple Top)SARS-COV-2 Vaccination 11/23/2019, 12/18/2019  . Pneumococcal Conjugate-13 11/22/2014  . Pneumococcal Polysaccharide-23 11/21/2006    TDAP status: Due, Education has been provided regarding the importance of this vaccine. Advised may receive this vaccine at local pharmacy or Health Dept. Aware to provide a  copy of the vaccination record if obtained from local pharmacy or Health  Dept. Verbalized acceptance and understanding. Deferred.   Health Maintenance Health Maintenance  Topic Date Due  . COVID-19 Vaccine (3 - Booster for Pfizer series) 03/05/2021 (Originally 06/16/2020)  . TETANUS/TDAP  02/12/2022 (Originally 07/29/1960)  . INFLUENZA VACCINE  05/20/2021  . DEXA SCAN  Completed  . PNA vac Low Risk Adult  Completed  . HPV VACCINES  Aged Out  . COLONOSCOPY (Pts 45-80yrs Insurance coverage will need to be confirmed)  Discontinued   Colorectal cancer screening: No longer required.  Previously noted in chart.   Mammogram status: Completed 08/22/20. Repeat every year  Lung Cancer Screening: (Low Dose CT Chest recommended if Age 23-80 years, 30 pack-year currently smoking OR have quit w/in 15years.) does not qualify.   Hepatitis C Screening: does not qualify.  Vision Screening: Recommended annual ophthalmology exams for early detection of glaucoma and other disorders of the eye. Is the patient up to date with their annual eye exam?  Yes   Dental Screening: Recommended annual dental exams for proper oral hygiene. Partials.  Community Resource Referral / Chronic Care Management: CRR required this visit?  No   CCM required this visit?  No      Plan:    Keep all routine maintenance appointments.   Follow up 03/05/21 @ 3:30  I have personally reviewed and noted the following in the patient's chart:   . Medical and social history . Use of alcohol, tobacco or illicit drugs  . Current medications and supplements . Functional ability and status . Nutritional status . Physical activity . Advanced directives . List of other physicians . Hospitalizations, surgeries, and ER visits in previous 12 months . Vitals . Screenings to include cognitive, depression, and falls . Referrals and appointments  In addition, I have reviewed and discussed with patient certain preventive protocols,  quality metrics, and best practice recommendations. A written personalized care plan for preventive services as well as general preventive health recommendations were provided to patient via mychart.     OBrien-Blaney, Xeng Kucher L, LPN   [id number]    I have reviewed the above information and agree with above.   Duncan Dull, MD

## 2021-02-22 ENCOUNTER — Ambulatory Visit: Payer: 59 | Admitting: Internal Medicine

## 2021-02-28 DIAGNOSIS — G250 Essential tremor: Secondary | ICD-10-CM | POA: Diagnosis not present

## 2021-02-28 DIAGNOSIS — M542 Cervicalgia: Secondary | ICD-10-CM | POA: Diagnosis not present

## 2021-03-05 ENCOUNTER — Encounter: Payer: Self-pay | Admitting: Internal Medicine

## 2021-03-05 ENCOUNTER — Other Ambulatory Visit: Payer: Self-pay | Admitting: Internal Medicine

## 2021-03-05 ENCOUNTER — Other Ambulatory Visit: Payer: Self-pay

## 2021-03-05 ENCOUNTER — Ambulatory Visit (INDEPENDENT_AMBULATORY_CARE_PROVIDER_SITE_OTHER): Payer: Medicare Other | Admitting: Internal Medicine

## 2021-03-05 VITALS — BP 116/64 | HR 62 | Temp 97.7°F | Ht 61.0 in | Wt 123.8 lb

## 2021-03-05 DIAGNOSIS — E039 Hypothyroidism, unspecified: Secondary | ICD-10-CM | POA: Diagnosis not present

## 2021-03-05 DIAGNOSIS — E785 Hyperlipidemia, unspecified: Secondary | ICD-10-CM

## 2021-03-05 DIAGNOSIS — Z79899 Other long term (current) drug therapy: Secondary | ICD-10-CM | POA: Diagnosis not present

## 2021-03-05 DIAGNOSIS — M5090 Cervical disc disorder, unspecified, unspecified cervical region: Secondary | ICD-10-CM

## 2021-03-05 DIAGNOSIS — G250 Essential tremor: Secondary | ICD-10-CM

## 2021-03-05 NOTE — Assessment & Plan Note (Addendum)
Mild,  Under treatment by Dr Sherryll Burger with propranolol and primidone.  Planning to start topamax

## 2021-03-05 NOTE — Progress Notes (Signed)
Subjective:  Patient ID: Vanessa Brown, female    DOB: 1941/08/05  Age: 80 y.o. MRN: [id number]  CC: The primary encounter diagnosis was Hyperlipidemia LDL goal <130. Diagnoses of Acquired hypothyroidism, Long-term use of high-risk medication, Benign essential tremor, and Cervical back pain with evidence of disc disease were also pertinent to this visit.  HPI Vanessa Brown presents for follow up on hypothyroidism and other issues   This visit occurred during the SARS-CoV-2 public health emergency.  Safety protocols were in place, including screening questions prior to the visit, additional usage of staff PPE, and extensive cleaning of exam room while observing appropriate contact time as indicated for disinfecting solutions.   Last seen Nov 2021 for annual. Mammogram done.  Vanessa Brown has been under treatment by Dr Sherryll Burger for essential tremors with multi drug therapy: primodone, topamax, and propranolol . she has not  started taking topamax yet bc insurance would not pay for the prescription the way it was written.  The tremor bothers her mostly when writing in late afternoon .   Energy level is good.  Sleeping well.  Appetite good. .  Weight stable. She remains physically active dancing 4 to 5 times per week at home,  And doing heavy yardwork  .  No falls,  Balance is fine. Had evaluation by ENT for positional vertigo   Taking biotin supplements for hair.    Hyperlipidemia:  She  DOES NOT WANT TO TAKE A STATIN DRUG.  She has altered her diet . Reviewed her chart:  There is no evidence of atherosclerosis in prior imaging studies.  She continues to have a Neuropathy ("toilet seat") affecting posterior legs.  Some cervical disk issues, aggravated by sleeping on left side .  Has had 2 ESI  X 2 by [facility] with good results that lasted years.   Sleeping well in front of the TV but sleeps alone and goes to bed late (widowed) . Not sleepy during the day unless she slows down   Outpatient Medications Prior to  Visit  Medication Sig Dispense Refill  . Biotin 5000 MCG CAPS Take 1 capsule by mouth daily.    . cholecalciferol (VITAMIN D) 1000 units tablet Take 1,000 Units by mouth daily.    . cyanocobalamin 1000 MCG tablet Take 1,000 mcg by mouth daily.    Marland Kitchen estradiol (CLIMARA - DOSED IN MG/24 HR) 0.025 mg/24hr patch APPLY ONE PATCH WEEKLY. 4 patch 0  . levothyroxine (SYNTHROID) 50 MCG tablet Take 1 tablet (50 mcg total) by mouth daily. 30 tablet 0  . Multiple Vitamins-Minerals (MULTIVITAMIN ADULT EXTRA C PO) Take by mouth.    Melene Muller ON 03/31/2021] primidone (MYSOLINE) 250 MG tablet Take 1 tablet by mouth 2 (two) times daily.    . propranolol (INDERAL) 40 MG tablet Take 40 mg by mouth 2 (two) times daily.    . primidone (MYSOLINE) 50 MG tablet Take 250 mg by mouth 2 (two) times daily.  (Patient not taking: Reported on 03/05/2021)    . topiramate (TOPAMAX) 25 MG tablet Take by mouth. (Patient not taking: Reported on 03/05/2021)     No facility-administered medications prior to visit.    Review of Systems;  Patient denies headache, fevers, malaise, unintentional weight loss, skin rash, eye pain, sinus congestion and sinus pain, sore throat, dysphagia,  hemoptysis , cough, dyspnea, wheezing, chest pain, palpitations, orthopnea, edema, abdominal pain, nausea, melena, diarrhea, constipation, flank pain, dysuria, hematuria, urinary  Frequency, nocturia, numbness, tingling, seizures,  Focal weakness, Loss  of consciousness,  Tremor, insomnia, depression, anxiety, and suicidal ideation.      Objective:  BP 116/64 (BP Location: Left Arm, Patient Position: Sitting, Cuff Size: Normal)   Pulse 62   Temp 97.7 F (36.5 C) (Oral)   Ht 5\' 1"  (1.549 m)   Wt 123 lb 12.8 oz (56.2 kg)   SpO2 98%   BMI 23.39 kg/m   BP Readings from Last 3 Encounters:  03/05/21 116/64  08/24/20 118/82  03/21/20 122/80    Wt Readings from Last 3 Encounters:  03/05/21 123 lb 12.8 oz (56.2 kg)  02/12/21 125 lb (56.7 kg)   08/24/20 125 lb 12.8 oz (57.1 kg)    General appearance: alert, cooperative and appears stated age Ears: normal TM's and external ear canals both ears Throat: lips, mucosa, and tongue normal; teeth and gums normal Neck: no adenopathy, no carotid bruit, supple, symmetrical, trachea midline and thyroid not enlarged, symmetric, no tenderness/mass/nodules Back: symmetric, no curvature. ROM normal. No CVA tenderness. Lungs: clear to auscultation bilaterally Heart: regular rate and rhythm, S1, S2 normal, no murmur, click, rub or gallop Abdomen: soft, non-tender; bowel sounds normal; no masses,  no organomegaly Pulses: 2+ and symmetric Skin: Skin color, texture, turgor normal. No rashes or lesions Lymph nodes: Cervical, supraclavicular, and axillary nodes normal.  No results found for: HGBA1C  Lab Results  Component Value Date   CREATININE 0.80 05/21/2020   CREATININE 0.80 05/21/2020   CREATININE 1.05 (H) 05/27/2019    Lab Results  Component Value Date   WBC 9.4 08/05/2018   HGB 13.2 08/05/2018   HCT 39.3 08/05/2018   PLT 412.0 (H) 08/05/2018   GLUCOSE 99 05/21/2020   GLUCOSE 99 05/21/2020   CHOL 271 (H) 05/21/2020   TRIG 79.0 05/21/2020   HDL 85.10 05/21/2020   LDLDIRECT 137.0 12/22/2017   LDLCALC 170 (H) 05/21/2020   ALT 12 05/21/2020   AST 14 05/21/2020   NA 138 05/21/2020   NA 138 05/21/2020   K 4.6 05/21/2020   K 4.6 05/21/2020   CL 103 05/21/2020   CL 103 05/21/2020   CREATININE 0.80 05/21/2020   CREATININE 0.80 05/21/2020   BUN 14 05/21/2020   BUN 14 05/21/2020   CO2 29 05/21/2020   CO2 29 05/21/2020   TSH 3.24 05/21/2020    MM 3D SCREEN BREAST BILATERAL  Result Date: 08/23/2020 CLINICAL DATA:  Screening. EXAM: DIGITAL SCREENING BILATERAL MAMMOGRAM WITH TOMO AND CAD COMPARISON:  Previous exam(s). ACR Breast Density Category c: The breast tissue is heterogeneously dense, which may obscure small masses. FINDINGS: There are no findings suspicious for malignancy.  Images were processed with CAD. IMPRESSION: No mammographic evidence of malignancy. A result letter of this screening mammogram will be mailed directly to the patient. RECOMMENDATION: Screening mammogram in one year. (Code:SM-B-01Y) BI-RADS CATEGORY  1: Negative. Electronically Signed   By: 13/01/2020 M.D.   On: 08/23/2020 15:30    Assessment & Plan:   Problem List Items Addressed This Visit      Unprioritized   Benign essential tremor    Mild,  Under treatment by Dr 13/01/2020 with propranolol and primidone.  Planning to start topamax      Cervical back pain with evidence of disc disease    Improved with ESI done  by Dr Sherryll Burger.   She is avoiding activities that aggravate her alignment.       Hyperlipidemia LDL goal <130 - Primary    Her ten year risk of CAD /CVA is  33% based on most recent lipid profile applied to the [facility] calculator.  Statin has been advised but she has  Again declined a trial of sttatin .  Lab Results  Component Value Date   CHOL 271 (H) 05/21/2020   HDL 85.10 05/21/2020   LDLCALC 170 (H) 05/21/2020   LDLDIRECT 137.0 12/22/2017   TRIG 79.0 05/21/2020   CHOLHDL 3 05/21/2020         Relevant Orders   Lipid panel   Hypothyroid    Patient was advised to suspend her biotin for several days prior to her next TSH/  TSH is pending   Lab Results  Component Value Date   TSH 3.24 05/21/2020         Relevant Orders   TSH    Other Visit Diagnoses    Long-term use of high-risk medication       Relevant Orders   Comprehensive metabolic panel   CBC with Differential/Platelet      I provided  30 minutes of  face-to-face time during this encounter reviewing patient's current tremor and neck pain and past surgeries, labs and imaging studies, providing counseling on the hyperlipidemia , and coordination  of care . I am having Valera Castle. Dwyer maintain her primidone, cyanocobalamin, Biotin, Multiple Vitamins-Minerals (MULTIVITAMIN ADULT EXTRA C PO), cholecalciferol,  propranolol, estradiol, levothyroxine, primidone, and topiramate.  No orders of the defined types were placed in this encounter.   There are no discontinued medications.  Follow-up: No follow-ups on file.   Sherlene Shams, MD

## 2021-03-05 NOTE — Patient Instructions (Addendum)
You need a tetanus booster every 10 years.     The ShingRx vaccine is now available in local pharmacies and is much more protective than the old one  Zostavax  (it is about 97%  Effective in preventing shingles).  It is therefore ADVISED for all interested adults over 50 to prevent shingles so I have printed you a prescription for it.  (it requires a 2nd dose 2 too 6 months after the first one) .  It will cause you to have flu  like symptoms for 2 days    You are due for one more Pneumonia  Vaccine  And  then YOU ARE DONE   PLEASE SUSPEND YOUR BIOTIN FOR 3 DAYS PRIOR TO RETURNING FOR YOUR BLOOD TESTS   DEXA SCAN IS DUE THIS YEAR AND WILL BE ORDERED

## 2021-03-06 NOTE — Assessment & Plan Note (Signed)
Patient was advised to suspend her biotin for several days prior to her next TSH/  TSH is pending   Lab Results  Component Value Date   TSH 3.24 05/21/2020

## 2021-03-06 NOTE — Assessment & Plan Note (Addendum)
Her ten year risk of CAD /CVA is 33% based on most recent lipid profile applied to the [facility] calculator.  Statin has been advised but she has  Again declined a trial of sttatin .  Lab Results  Component Value Date   CHOL 271 (H) 05/21/2020   HDL 85.10 05/21/2020   LDLCALC 170 (H) 05/21/2020   LDLDIRECT 137.0 12/22/2017   TRIG 79.0 05/21/2020   CHOLHDL 3 05/21/2020

## 2021-03-06 NOTE — Assessment & Plan Note (Signed)
Improved with ESI done  by Dr Shirlyn Goltz.   She is avoiding activities that aggravate her alignment.

## 2021-03-08 ENCOUNTER — Other Ambulatory Visit: Payer: Self-pay | Admitting: Internal Medicine

## 2021-03-21 ENCOUNTER — Other Ambulatory Visit: Payer: Self-pay

## 2021-03-21 ENCOUNTER — Other Ambulatory Visit (INDEPENDENT_AMBULATORY_CARE_PROVIDER_SITE_OTHER): Payer: Medicare Other

## 2021-03-21 DIAGNOSIS — E785 Hyperlipidemia, unspecified: Secondary | ICD-10-CM

## 2021-03-21 DIAGNOSIS — Z79899 Other long term (current) drug therapy: Secondary | ICD-10-CM

## 2021-03-21 DIAGNOSIS — E039 Hypothyroidism, unspecified: Secondary | ICD-10-CM | POA: Diagnosis not present

## 2021-03-21 LAB — CBC WITH DIFFERENTIAL/PLATELET
Basophils Absolute: 0.1 10*3/uL (ref 0.0–0.1)
Basophils Relative: 1.1 % (ref 0.0–3.0)
Eosinophils Absolute: 0.5 10*3/uL (ref 0.0–0.7)
Eosinophils Relative: 8 % — ABNORMAL HIGH (ref 0.0–5.0)
HCT: 34.1 % — ABNORMAL LOW (ref 36.0–46.0)
Hemoglobin: 11.7 g/dL — ABNORMAL LOW (ref 12.0–15.0)
Lymphocytes Relative: 35.6 % (ref 12.0–46.0)
Lymphs Abs: 2.1 10*3/uL (ref 0.7–4.0)
MCHC: 34.3 g/dL (ref 30.0–36.0)
MCV: 104.6 fl — ABNORMAL HIGH (ref 78.0–100.0)
Monocytes Absolute: 0.5 10*3/uL (ref 0.1–1.0)
Monocytes Relative: 8.7 % (ref 3.0–12.0)
Neutro Abs: 2.7 10*3/uL (ref 1.4–7.7)
Neutrophils Relative %: 46.6 % (ref 43.0–77.0)
Platelets: 314 10*3/uL (ref 150.0–400.0)
RBC: 3.26 Mil/uL — ABNORMAL LOW (ref 3.87–5.11)
RDW: 13.8 % (ref 11.5–15.5)
WBC: 5.9 10*3/uL (ref 4.0–10.5)

## 2021-03-21 LAB — TSH: TSH: 4.22 u[IU]/mL (ref 0.35–4.50)

## 2021-03-22 LAB — LIPID PANEL
Cholesterol: 247 mg/dL — ABNORMAL HIGH (ref 0–200)
HDL: 79 mg/dL (ref >39.00)
LDL Cholesterol: 156 mg/dL — ABNORMAL HIGH (ref 0–99)
NonHDL: 168.16
Total CHOL/HDL Ratio: 3
Triglycerides: 63 mg/dL (ref 0.0–149.0)
VLDL: 12.6 mg/dL (ref 0.0–40.0)

## 2021-03-22 LAB — COMPREHENSIVE METABOLIC PANEL
ALT: 11 U/L (ref 0–35)
AST: 13 U/L (ref 0–37)
Albumin: 4 g/dL (ref 3.5–5.2)
Alkaline Phosphatase: 102 U/L (ref 39–117)
BUN: 15 mg/dL (ref 6–23)
CO2: 24 mEq/L (ref 19–32)
Calcium: 9.1 mg/dL (ref 8.4–10.5)
Chloride: 104 mEq/L (ref 96–112)
Creatinine, Ser: 0.87 mg/dL (ref 0.40–1.20)
GFR: 62.82 mL/min (ref >60.00)
Glucose, Bld: 87 mg/dL (ref 70–99)
Potassium: 4.2 mEq/L (ref 3.5–5.1)
Sodium: 139 mEq/L (ref 135–145)
Total Bilirubin: 0.3 mg/dL (ref 0.2–1.2)
Total Protein: 6.5 g/dL (ref 6.0–8.3)

## 2021-03-24 ENCOUNTER — Other Ambulatory Visit: Payer: Self-pay | Admitting: Internal Medicine

## 2021-03-24 DIAGNOSIS — D649 Anemia, unspecified: Secondary | ICD-10-CM | POA: Insufficient documentation

## 2021-03-25 ENCOUNTER — Telehealth: Payer: Self-pay

## 2021-03-25 NOTE — Telephone Encounter (Signed)
Pt states that she was calling you back about lab results. She has more questions. Please advise

## 2021-03-27 NOTE — Telephone Encounter (Signed)
Spoke with pt yesterday and scheduled her for a lab appt. Pt did not want to schedule an office visit at that time.

## 2021-04-01 ENCOUNTER — Other Ambulatory Visit (INDEPENDENT_AMBULATORY_CARE_PROVIDER_SITE_OTHER): Payer: Medicare Other

## 2021-04-01 ENCOUNTER — Other Ambulatory Visit: Payer: Self-pay

## 2021-04-01 DIAGNOSIS — D649 Anemia, unspecified: Secondary | ICD-10-CM

## 2021-04-02 LAB — CBC WITH DIFFERENTIAL/PLATELET
Basophils Absolute: 0.1 10*3/uL (ref 0.0–0.1)
Basophils Relative: 1 % (ref 0.0–3.0)
Eosinophils Absolute: 0.5 10*3/uL (ref 0.0–0.7)
Eosinophils Relative: 7.6 % — ABNORMAL HIGH (ref 0.0–5.0)
HCT: 35.1 % — ABNORMAL LOW (ref 36.0–46.0)
Hemoglobin: 11.9 g/dL — ABNORMAL LOW (ref 12.0–15.0)
Lymphocytes Relative: 36 % (ref 12.0–46.0)
Lymphs Abs: 2.2 10*3/uL (ref 0.7–4.0)
MCHC: 33.8 g/dL (ref 30.0–36.0)
MCV: 104.7 fl — ABNORMAL HIGH (ref 78.0–100.0)
Monocytes Absolute: 0.5 10*3/uL (ref 0.1–1.0)
Monocytes Relative: 8.1 % (ref 3.0–12.0)
Neutro Abs: 2.9 10*3/uL (ref 1.4–7.7)
Neutrophils Relative %: 47.3 % (ref 43.0–77.0)
Platelets: 348 10*3/uL (ref 150.0–400.0)
RBC: 3.35 Mil/uL — ABNORMAL LOW (ref 3.87–5.11)
RDW: 13.8 % (ref 11.5–15.5)
WBC: 6.1 10*3/uL (ref 4.0–10.5)

## 2021-04-02 LAB — B12 AND FOLATE PANEL
Folate: >24.4 ng/mL (ref >5.9)
Vitamin B-12: 699 pg/mL (ref 211–911)

## 2021-04-03 LAB — IRON,TIBC AND FERRITIN PANEL
%SAT: 48 % (calc) — ABNORMAL HIGH (ref 16–45)
Ferritin: 45 ng/mL (ref 16–288)
Iron: 138 ug/dL (ref 45–160)
TIBC: 287 mcg/dL (calc) (ref 250–450)

## 2021-04-03 LAB — INTRINSIC FACTOR ANTIBODIES: Intrinsic Factor: NEGATIVE

## 2021-04-05 NOTE — Telephone Encounter (Signed)
PT called to see if Vanessa Brown will mail her the results from her labs.

## 2021-04-05 NOTE — Telephone Encounter (Signed)
Spoke with pt to let her know that the labs have been placed up front to be mailed out.

## 2021-04-10 ENCOUNTER — Other Ambulatory Visit: Payer: Self-pay | Admitting: Internal Medicine

## 2021-04-10 DIAGNOSIS — E039 Hypothyroidism, unspecified: Secondary | ICD-10-CM

## 2021-04-19 ENCOUNTER — Other Ambulatory Visit: Payer: Self-pay | Admitting: Internal Medicine

## 2021-05-13 ENCOUNTER — Other Ambulatory Visit: Payer: Self-pay | Admitting: Internal Medicine

## 2021-05-13 DIAGNOSIS — E039 Hypothyroidism, unspecified: Secondary | ICD-10-CM

## 2021-06-12 ENCOUNTER — Other Ambulatory Visit: Payer: Self-pay | Admitting: Internal Medicine

## 2021-06-17 ENCOUNTER — Other Ambulatory Visit: Payer: Self-pay | Admitting: Internal Medicine

## 2021-06-17 DIAGNOSIS — E039 Hypothyroidism, unspecified: Secondary | ICD-10-CM

## 2021-07-16 ENCOUNTER — Other Ambulatory Visit: Payer: Self-pay | Admitting: Internal Medicine

## 2021-07-22 ENCOUNTER — Other Ambulatory Visit: Payer: Self-pay | Admitting: Internal Medicine

## 2021-07-22 DIAGNOSIS — E039 Hypothyroidism, unspecified: Secondary | ICD-10-CM

## 2021-08-07 ENCOUNTER — Other Ambulatory Visit: Payer: Self-pay | Admitting: Internal Medicine

## 2021-08-07 DIAGNOSIS — Z1231 Encounter for screening mammogram for malignant neoplasm of breast: Secondary | ICD-10-CM

## 2021-08-08 ENCOUNTER — Other Ambulatory Visit: Payer: Self-pay | Admitting: Internal Medicine

## 2021-08-08 DIAGNOSIS — Z23 Encounter for immunization: Secondary | ICD-10-CM | POA: Diagnosis not present

## 2021-08-24 ENCOUNTER — Other Ambulatory Visit: Payer: Self-pay | Admitting: Internal Medicine

## 2021-08-24 DIAGNOSIS — E039 Hypothyroidism, unspecified: Secondary | ICD-10-CM

## 2021-09-23 ENCOUNTER — Other Ambulatory Visit: Payer: Self-pay | Admitting: Internal Medicine

## 2021-09-23 DIAGNOSIS — E039 Hypothyroidism, unspecified: Secondary | ICD-10-CM

## 2021-10-22 ENCOUNTER — Other Ambulatory Visit: Payer: Self-pay | Admitting: Internal Medicine

## 2021-10-22 DIAGNOSIS — E039 Hypothyroidism, unspecified: Secondary | ICD-10-CM

## 2021-10-29 ENCOUNTER — Ambulatory Visit
Admission: RE | Admit: 2021-10-29 | Discharge: 2021-10-29 | Disposition: A | Discharge status: Home / Self Care | Payer: Medicare Other | Source: Ambulatory Visit | Attending: Internal Medicine | Admitting: Internal Medicine

## 2021-10-29 ENCOUNTER — Other Ambulatory Visit: Payer: Self-pay

## 2021-10-29 DIAGNOSIS — Z1231 Encounter for screening mammogram for malignant neoplasm of breast: Secondary | ICD-10-CM | POA: Diagnosis not present

## 2021-11-04 DIAGNOSIS — H2513 Age-related nuclear cataract, bilateral: Secondary | ICD-10-CM | POA: Diagnosis not present

## 2021-11-04 DIAGNOSIS — H0288A Meibomian gland dysfunction right eye, upper and lower eyelids: Secondary | ICD-10-CM | POA: Diagnosis not present

## 2021-11-04 DIAGNOSIS — H0288B Meibomian gland dysfunction left eye, upper and lower eyelids: Secondary | ICD-10-CM | POA: Diagnosis not present

## 2021-11-04 DIAGNOSIS — H5203 Hypermetropia, bilateral: Secondary | ICD-10-CM | POA: Diagnosis not present

## 2021-11-20 ENCOUNTER — Other Ambulatory Visit: Payer: Self-pay | Admitting: Internal Medicine

## 2021-12-20 ENCOUNTER — Other Ambulatory Visit: Payer: Self-pay | Admitting: Internal Medicine

## 2022-01-15 ENCOUNTER — Other Ambulatory Visit: Payer: Self-pay | Admitting: Internal Medicine

## 2022-01-29 ENCOUNTER — Other Ambulatory Visit: Payer: Self-pay | Admitting: Internal Medicine

## 2022-01-29 DIAGNOSIS — E039 Hypothyroidism, unspecified: Secondary | ICD-10-CM

## 2022-02-13 ENCOUNTER — Ambulatory Visit: Payer: Self-pay

## 2022-02-17 ENCOUNTER — Other Ambulatory Visit: Payer: Self-pay | Admitting: Internal Medicine

## 2022-02-17 NOTE — Telephone Encounter (Signed)
Lm for pt to cb re : needs appointment ?

## 2022-02-18 ENCOUNTER — Other Ambulatory Visit: Payer: Self-pay

## 2022-02-18 ENCOUNTER — Telehealth: Payer: Self-pay | Admitting: Internal Medicine

## 2022-02-18 MED ORDER — ESTRADIOL 0.025 MG/24HR TD PTWK: 0.0250 mg | MEDICATED_PATCH | TRANSDERMAL | 2 refills | Status: DC

## 2022-02-18 NOTE — Telephone Encounter (Signed)
Called pt and spoke with her to let her knowt hat she would been due for a yearly follow up in May or June. Pt gave a verbal understanding and scheduled her appt.  ?

## 2022-02-18 NOTE — Telephone Encounter (Addendum)
Patient wants a call back regarding a reason for follow up before a years time. She stated she only comes once a  year to see the provider and have labs done.  ?

## 2022-02-18 NOTE — Telephone Encounter (Signed)
Spoke with pt and explained to her that she is coming up due for an appt around 04/05/2022 for her yearly. Pt was fine with scheduling the appt. Appt has been scheduled and pt is aware. Medication has been refilled.   ?

## 2022-02-26 DIAGNOSIS — G250 Essential tremor: Secondary | ICD-10-CM | POA: Diagnosis not present

## 2022-02-26 DIAGNOSIS — I10 Essential (primary) hypertension: Secondary | ICD-10-CM | POA: Diagnosis not present

## 2022-02-26 DIAGNOSIS — H9202 Otalgia, left ear: Secondary | ICD-10-CM | POA: Diagnosis not present

## 2022-02-26 DIAGNOSIS — E785 Hyperlipidemia, unspecified: Secondary | ICD-10-CM | POA: Diagnosis not present

## 2022-02-26 DIAGNOSIS — M542 Cervicalgia: Secondary | ICD-10-CM | POA: Diagnosis not present

## 2022-04-02 ENCOUNTER — Telehealth: Payer: Self-pay | Admitting: Internal Medicine

## 2022-04-02 NOTE — Telephone Encounter (Signed)
Copied from CRM [id number]. Topic: Medicare AWV >> Apr 02, 2022  3:15 PM Payton Doughty wrote: Reason for CRM: Left message for patient to schedule Annual Wellness Visit.  Please schedule with Nurse Health Advisor Denisa O'Brien-Blaney, LPN at [facility].  Please call [phone] ask for [facility]

## 2022-04-10 ENCOUNTER — Ambulatory Visit (INDEPENDENT_AMBULATORY_CARE_PROVIDER_SITE_OTHER): Payer: Medicare Other | Admitting: Internal Medicine

## 2022-04-10 ENCOUNTER — Encounter: Payer: Self-pay | Admitting: Internal Medicine

## 2022-04-10 VITALS — BP 140/78 | HR 50 | Temp 97.9°F | Ht 61.0 in | Wt 119.4 lb

## 2022-04-10 DIAGNOSIS — E785 Hyperlipidemia, unspecified: Secondary | ICD-10-CM

## 2022-04-10 DIAGNOSIS — D649 Anemia, unspecified: Secondary | ICD-10-CM

## 2022-04-10 DIAGNOSIS — Z8711 Personal history of peptic ulcer disease: Secondary | ICD-10-CM | POA: Diagnosis not present

## 2022-04-10 DIAGNOSIS — Z1211 Encounter for screening for malignant neoplasm of colon: Secondary | ICD-10-CM

## 2022-04-10 DIAGNOSIS — R7301 Impaired fasting glucose: Secondary | ICD-10-CM | POA: Diagnosis not present

## 2022-04-10 DIAGNOSIS — H259 Unspecified age-related cataract: Secondary | ICD-10-CM

## 2022-04-10 DIAGNOSIS — E039 Hypothyroidism, unspecified: Secondary | ICD-10-CM | POA: Diagnosis not present

## 2022-04-10 DIAGNOSIS — H269 Unspecified cataract: Secondary | ICD-10-CM | POA: Insufficient documentation

## 2022-04-10 DIAGNOSIS — Z1231 Encounter for screening mammogram for malignant neoplasm of breast: Secondary | ICD-10-CM

## 2022-04-10 DIAGNOSIS — Z860100 Personal history of colon polyps, unspecified: Secondary | ICD-10-CM | POA: Insufficient documentation

## 2022-04-10 DIAGNOSIS — G250 Essential tremor: Secondary | ICD-10-CM | POA: Diagnosis not present

## 2022-04-10 LAB — LIPID PANEL
Cholesterol: 305 mg/dL — ABNORMAL HIGH (ref 0–200)
HDL: 89 mg/dL (ref >39.00)
LDL Cholesterol: 197 mg/dL — ABNORMAL HIGH (ref 0–99)
NonHDL: 216.41
Total CHOL/HDL Ratio: 3
Triglycerides: 95 mg/dL (ref 0.0–149.0)
VLDL: 19 mg/dL (ref 0.0–40.0)

## 2022-04-10 LAB — COMPREHENSIVE METABOLIC PANEL
ALT: 12 U/L (ref 0–35)
AST: 14 U/L (ref 0–37)
Albumin: 4.3 g/dL (ref 3.5–5.2)
Alkaline Phosphatase: 108 U/L (ref 39–117)
BUN: 19 mg/dL (ref 6–23)
CO2: 29 mEq/L (ref 19–32)
Calcium: 9.9 mg/dL (ref 8.4–10.5)
Chloride: 97 mEq/L (ref 96–112)
Creatinine, Ser: 0.96 mg/dL (ref 0.40–1.20)
GFR: 55.41 mL/min — ABNORMAL LOW (ref >60.00)
Glucose, Bld: 86 mg/dL (ref 70–99)
Potassium: 4.7 mEq/L (ref 3.5–5.1)
Sodium: 133 mEq/L — ABNORMAL LOW (ref 135–145)
Total Bilirubin: 0.3 mg/dL (ref 0.2–1.2)
Total Protein: 7.1 g/dL (ref 6.0–8.3)

## 2022-04-10 LAB — CBC WITH DIFFERENTIAL/PLATELET
Basophils Absolute: 0.1 10*3/uL (ref 0.0–0.1)
Basophils Relative: 1.4 % (ref 0.0–3.0)
Eosinophils Absolute: 0.2 10*3/uL (ref 0.0–0.7)
Eosinophils Relative: 4 % (ref 0.0–5.0)
HCT: 38.8 % (ref 36.0–46.0)
Hemoglobin: 12.8 g/dL (ref 12.0–15.0)
Lymphocytes Relative: 28.9 % (ref 12.0–46.0)
Lymphs Abs: 1.8 10*3/uL (ref 0.7–4.0)
MCHC: 33.1 g/dL (ref 30.0–36.0)
MCV: 106.4 fl — ABNORMAL HIGH (ref 78.0–100.0)
Monocytes Absolute: 0.6 10*3/uL (ref 0.1–1.0)
Monocytes Relative: 9.8 % (ref 3.0–12.0)
Neutro Abs: 3.5 10*3/uL (ref 1.4–7.7)
Neutrophils Relative %: 55.9 % (ref 43.0–77.0)
Platelets: 399 10*3/uL (ref 150.0–400.0)
RBC: 3.64 Mil/uL — ABNORMAL LOW (ref 3.87–5.11)
RDW: 13.8 % (ref 11.5–15.5)
WBC: 6.2 10*3/uL (ref 4.0–10.5)

## 2022-04-10 LAB — TSH: TSH: 3.38 u[IU]/mL (ref 0.35–5.50)

## 2022-04-10 LAB — LDL CHOLESTEROL, DIRECT: Direct LDL: 167 mg/dL

## 2022-04-10 LAB — HEMOGLOBIN A1C: Hgb A1c MFr Bld: 5.6 % (ref 4.6–6.5)

## 2022-04-10 MED ORDER — ZOSTER VAC RECOMB ADJUVANTED 50 MCG/0.5ML IM SUSR: 0.5000 mL | Freq: Once | INTRAMUSCULAR | 1 refills | Status: AC

## 2022-04-10 MED ORDER — TETANUS-DIPHTH-ACELL PERTUSSIS 5-2.5-18.5 LF-MCG/0.5 IM SUSP: 0.5000 mL | Freq: Once | INTRAMUSCULAR | 0 refills | Status: AC

## 2022-04-10 NOTE — Assessment & Plan Note (Addendum)
Last colonoscopy was normal,  No polyps in 2015 or in the previous

## 2022-04-10 NOTE — Assessment & Plan Note (Signed)
She has had occasional episodes of gastritis related to eating certain foods;  resolves with Gaviscon prn.  She does not use NSAIDs.  No indication to resume PPI

## 2022-04-10 NOTE — Progress Notes (Unsigned)
Patient ID: Vanessa Brown, female    DOB: [birth year]  Age: 81 y.o. MRN: [id number]  The patient is here for follow up and  management of other chronic and acute problems.   The risk factors are reflected in the social history.  The roster of all physicians providing medical care to patient - is listed in the Snapshot section of the chart.  Activities of daily living:  The patient is 100% independent in all ADLs: dressing, toileting, feeding as well as independent mobility  Home safety : The patient has smoke detectors in the home. They wear seatbelts.  There are no firearms at home. There is no violence in the home.   There is no risks for hepatitis, STDs or HIV. There is no   history of blood transfusion. They have no travel history to infectious disease endemic areas of the world.  The patient has seen their dentist in the last six month. They have seen their eye doctor in the last  2 months for contact lens renewal .   She has an early cataract on left eye.   Not causing visual impairment.  She denies any  hearing difficulty with regard to whispered voices and some television programs.  They have deferred audiologic testing in the last year.  They do not  have excessive sun exposure. Discussed the need for sun protection: hats, long sleeves and use of sunscreen if there is significant sun exposure.   Diet: the importance of a healthy diet is discussed.   She is following a low sodium low carb low cal diet .  The benefits of regular aerobic exercise were discussed. She walks 4 times per week ,  30 minutes.   Depression screen: there are no signs or vegative symptoms of depression- irritability, change in appetite, anhedonia, sadness/tearfullness.  Cognitive assessment: the patient manages all their financial and personal affairs and is actively engaged. They could relate day,date,year and events; recalled 2/3 objects at 3 minutes; performed clock-face test normally.  The following portions of  the patient's history were reviewed and updated as appropriate: allergies, current medications, past family history, past medical history,  past surgical history, past social history  and problem list.  Visual acuity was not assessed per patient preference since she has regular follow up with her ophthalmologist. Hearing and body mass index were assessed and reviewed.   During the course of the visit the patient was educated and counseled about appropriate screening and preventive services including : fall prevention , diabetes screening, nutrition counseling, colorectal cancer screening, and recommended immunizations.    CC: The primary encounter diagnosis was Acquired hypothyroidism. Diagnoses of Hyperlipidemia LDL goal <130, Anemia, unspecified type, and Impaired fasting glucose were also pertinent to this visit.  1) weight loss:  currently down 5 lbs from last year. Denies poor appetite and early satiety .  Her husband died 6 yrs ago  started losing weight after that intentionally (when weight was 137,  size 12) .  Has food allergies to seafoods,  and avoids red meats and typical "Southern food" .  Avoids fried food ,  history of gastric ulcers.  Was taking topamax until  end of May   2) BET:  reviewed treatments she has had  3) left ear pain occurring at night IF she sleeps on left side. Has had ENT evaluation  ,  no source found , but believes it may be related to her sleeping without her "partial" on that side . For the  last 2 nights she has slept with the "partial" in place and the pain has not been triggered   4) bilateral hamstring pain caused by prolonged sitting and by sitting on the commode.   Resolves with standing up  .  Has lumbar disk disease    History Vanessa Brown has a past medical history of Anxiety, Arthritis, Cervical back pain with evidence of disc disease (2007), Duodenal ulcer, Gastric ulcer, GERD (gastroesophageal reflux disease), Irritable bowel syndrome (IBS), Neuropathy,  Screening for breast cancer, Screening for cervical cancer, and Tremor, essential.   She has a past surgical history that includes Rotator cuff repair (2004); Biopsy breast; Abdominal hysterectomy (1967); Breast surgery; Breast biopsy (Right, 2012); and Breast biopsy (Left, 2007).   Her family history includes Dementia in her father; Diabetes in her mother and son; Heart block in her sister; Parkinson's disease in her father.She reports that she quit smoking about 22 years ago. Her smoking use included cigarettes. She has never used smokeless tobacco. She reports current alcohol use. She reports that she does not use drugs.  Outpatient Medications Prior to Visit  Medication Sig Dispense Refill   Biotin 5000 MCG CAPS Take 1 capsule by mouth daily.     cholecalciferol (VITAMIN D) 1000 units tablet Take 1,000 Units by mouth daily.     cyanocobalamin 1000 MCG tablet Take 1,000 mcg by mouth daily.     estradiol (CLIMARA - DOSED IN MG/24 HR) 0.025 mg/24hr patch Place 1 patch (0.025 mg total) onto the skin once a week. 4 patch 2   levothyroxine (SYNTHROID) 50 MCG tablet Take 1 tablet (50 mcg total) by mouth daily. 90 tablet 0   Multiple Vitamins-Minerals (MULTIVITAMIN ADULT EXTRA C PO) Take by mouth.     primidone (MYSOLINE) 250 MG tablet Take 1 tablet by mouth 2 (two) times daily.     primidone (MYSOLINE) 50 MG tablet Take 250 mg by mouth 2 (two) times daily.     propranolol (INDERAL) 40 MG tablet Take 40 mg by mouth 2 (two) times daily.     topiramate (TOPAMAX) 25 MG tablet Take by mouth. (Patient not taking: Reported on 03/05/2021)     No facility-administered medications prior to visit.    Review of Systems  Objective:  BP 140/78 (BP Location: Left Arm, Patient Position: Sitting, Cuff Size: Normal)   Pulse (!) 50   Temp 97.9 F (36.6 C) (Oral)   Ht 5\' 1"  (1.549 m)   Wt 119 lb 6.4 oz (54.2 kg)   SpO2 97%   BMI 22.56 kg/m   Physical Exam  Physical Exam   Assessment & Plan:    Problem List Items Addressed This Visit     Hyperlipidemia LDL goal <130   Hypothyroid - Primary   Anemia, unspecified   Other Visit Diagnoses     Impaired fasting glucose           I have discontinued . Struthers's topiramate. I am also having her maintain her primidone, cyanocobalamin, Biotin, Multiple Vitamins-Minerals (MULTIVITAMIN ADULT EXTRA C PO), cholecalciferol, propranolol, primidone, levothyroxine, and estradiol.  No orders of the defined types were placed in this encounter.   Medications Discontinued During This Encounter  Medication Reason   topiramate (TOPAMAX) 25 MG tablet     Follow-up: No follow-ups on file.   Valera Castle, MD

## 2022-04-10 NOTE — Assessment & Plan Note (Signed)
Her pain is mild to moderate and radiates to both posterior thighs with prolonged sitting but is relieved immediately with upright position .  Back extension exercises demonstrated

## 2022-04-10 NOTE — Patient Instructions (Addendum)
  Please check your blood pressure a few times at home and send me the readings so I can determine if you need to start a medication to lower your blood pressure . As long as your readings are  140/80 you do not need treatment     The Tdap (tetanus-diphtheria-whooping cough vaccine ) and Shingrix vaccines are now  COVERED BY MEDICARE if you get them at your pharmacy as of  January 1.   You can expect 24 hours of flu like symptoms after receiving the shingles vaccine, so plan accordingly.

## 2022-04-10 NOTE — Assessment & Plan Note (Addendum)
managed by Neurology.  Last seen in May.  topamax taper to off was advised,  Continue primidone and propanolol; she is not considering the  Recommended  optional treatments of ultrasound guided thalatomy. Or the steadiwear glove

## 2022-04-10 NOTE — Assessment & Plan Note (Addendum)
Her ten year risk of CAD /CVA is 33% based on most recent lipid profile applied to the [facility] calculator.  Statin has been recommended as primary prevention in the past but declined;  Repeat level are pending .   Lab Results  Component Value Date   CHOL 247 (H) 03/21/2021   HDL 79.00 03/21/2021   LDLCALC 156 (H) 03/21/2021   LDLDIRECT 137.0 12/22/2017   TRIG 63.0 03/21/2021   CHOLHDL 3 03/21/2021

## 2022-04-12 NOTE — Assessment & Plan Note (Signed)
Thyroid function is WNL on current dose.  No current changes needed.   Lab Results  Component Value Date   TSH 3.38 04/10/2022

## 2022-04-24 ENCOUNTER — Telehealth: Payer: Self-pay | Admitting: Internal Medicine

## 2022-04-24 ENCOUNTER — Other Ambulatory Visit: Payer: Self-pay

## 2022-04-24 DIAGNOSIS — D649 Anemia, unspecified: Secondary | ICD-10-CM

## 2022-04-24 DIAGNOSIS — E039 Hypothyroidism, unspecified: Secondary | ICD-10-CM

## 2022-04-24 DIAGNOSIS — E785 Hyperlipidemia, unspecified: Secondary | ICD-10-CM

## 2022-04-24 NOTE — Telephone Encounter (Unsigned)
Name: Vanessa Brown Phone: [phone] Provider: Dr. Darrick Huntsman  Message: Spoke with patient to sched her AWV.  She inquired about lab results taken end of April 10, 2022.  She stated she cannot get on her Mychart due to computer issues.  She would like a call from office about results.  Please advise. khc

## 2022-04-24 NOTE — Telephone Encounter (Signed)
I called and spoke with the patient and she is scheduled in 6-7 months for a lab recheck and she asked me to mail her lab results and I mailed that to her today.  She is scheduled and ordered.  Ardean Simonich,cma

## 2022-04-29 ENCOUNTER — Other Ambulatory Visit: Payer: Self-pay | Admitting: Internal Medicine

## 2022-04-29 DIAGNOSIS — E039 Hypothyroidism, unspecified: Secondary | ICD-10-CM

## 2022-05-05 ENCOUNTER — Telehealth: Payer: Self-pay | Admitting: Internal Medicine

## 2022-05-05 NOTE — Telephone Encounter (Signed)
Spoke with patient she req CB 05/2022

## 2022-05-06 DIAGNOSIS — Z1211 Encounter for screening for malignant neoplasm of colon: Secondary | ICD-10-CM | POA: Diagnosis not present

## 2022-05-14 LAB — COLOGUARD: COLOGUARD: NEGATIVE

## 2022-05-19 ENCOUNTER — Telehealth: Payer: Self-pay | Admitting: Internal Medicine

## 2022-05-19 NOTE — Telephone Encounter (Signed)
Called patient to sched her AWV. She declined and would like a call from nurse for her test results from her [facility] test.   Name: Vanessa Brown                                             Phone: [phone] Provider: Dr Tullo                       Please advise.  Thanks  

## 2022-05-19 NOTE — Telephone Encounter (Unsigned)
Called patient to sched her AWV. She declined and would like a call from nurse for her test results from her [facility] test.   Name: Vanessa Brown                                             Phone: [phone] Provider: Dr Darrick Huntsman                       Please advise.  Thanks

## 2022-05-20 NOTE — Telephone Encounter (Signed)
LMTCB

## 2022-06-02 ENCOUNTER — Other Ambulatory Visit: Payer: Self-pay | Admitting: Internal Medicine

## 2022-06-11 ENCOUNTER — Telehealth: Payer: Self-pay | Admitting: Internal Medicine

## 2022-06-11 NOTE — Telephone Encounter (Signed)
Spoke with patient she stated to [facility] 06/2022

## 2022-06-20 ENCOUNTER — Telehealth: Payer: Self-pay | Admitting: Internal Medicine

## 2022-06-20 NOTE — Telephone Encounter (Signed)
Spoke with patient she stated to [facility] 07/07/22

## 2022-07-22 DIAGNOSIS — Z23 Encounter for immunization: Secondary | ICD-10-CM | POA: Diagnosis not present

## 2022-08-12 ENCOUNTER — Telehealth: Payer: Self-pay | Admitting: Internal Medicine

## 2022-08-12 NOTE — Telephone Encounter (Signed)
Spoke with patient she req a call back in 08/2022

## 2022-08-20 ENCOUNTER — Ambulatory Visit (INDEPENDENT_AMBULATORY_CARE_PROVIDER_SITE_OTHER): Payer: Medicare Other

## 2022-08-20 VITALS — Ht 61.0 in | Wt 119.0 lb

## 2022-08-20 DIAGNOSIS — Z0000 Encounter for general adult medical examination without abnormal findings: Secondary | ICD-10-CM | POA: Diagnosis not present

## 2022-08-20 NOTE — Progress Notes (Addendum)
Subjective:   Vanessa Brown is a 81 y.o. female who presents for Medicare Annual (Subsequent) preventive examination.  Review of Systems    No ROS.  Medicare Wellness Virtual Visit.  Visual/audio telehealth visit, UTA vital signs.   See social history for additional risk factors.   Cardiac Risk Factors include: advanced age (>79men, >88 women)     Objective:    Today's Vitals   08/20/22 1536  Weight: 119 lb (54 kg)  Height: 5\' 1"  (1.549 m)   Body mass index is 22.48 kg/m.     08/20/2022    3:42 PM 02/12/2021   10:50 AM 12/26/2019   11:13 AM 12/24/2018   11:33 AM 12/22/2017   11:35 AM 12/15/2016   11:53 AM  Advanced Directives  Does Patient Have a Medical Advance Directive? Yes Yes Yes Yes Yes Yes  Type of Paramedic of [place];Living will [place];Living will [place];Living will [place];Living will [place];Living will  Does patient want to make changes to medical advance directive? No - Patient declined No - Patient declined No - Patient declined No - Patient declined No - Patient declined No - Patient declined  Copy of [place] in Chart? No - copy requested No - copy requested No - copy requested No - copy requested No - copy requested No - copy requested    Current Medications (verified) Outpatient Encounter Medications as of 08/20/2022  Medication Sig   Biotin 5000 MCG CAPS Take 1 capsule by mouth daily.   cholecalciferol (VITAMIN D) 1000 units tablet Take 1,000 Units by mouth daily.   cyanocobalamin 1000 MCG tablet Take 1,000 mcg by mouth daily.   estradiol (CLIMARA - DOSED IN MG/24 HR) 0.025 mg/24hr patch Place 1 patch (0.025 mg total) onto the skin once a week.   levothyroxine (SYNTHROID) 50 MCG tablet Take 1 tablet (50 mcg total) by mouth daily.   Multiple Vitamins-Minerals (MULTIVITAMIN ADULT EXTRA C PO) Take by mouth.    primidone (MYSOLINE) 250 MG tablet Take 1 tablet by mouth 2 (two) times daily.   primidone (MYSOLINE) 50 MG tablet Take 250 mg by mouth 2 (two) times daily.   propranolol (INDERAL) 40 MG tablet Take 40 mg by mouth 2 (two) times daily.   No facility-administered encounter medications on file as of 08/20/2022.    Allergies (verified) Bee venom, Iodinated contrast media, Shellfish allergy, Tilactase, Wheat bran, and Doxycycline   History: Past Medical History:  Diagnosis Date   Anxiety    Arthritis    spine, prior Pain Clinic patient   Cervical back pain with evidence of disc disease 2007   prior epidural steorid injection s   Duodenal ulcer    Gastric ulcer    H Pylori positive   GERD (gastroesophageal reflux disease)    Irritable bowel syndrome (IBS)    Teena Irani, [facility]   Neuropathy    "toilet seat", Manuella Ghazi), caused  by sitting on commode   Screening for breast cancer    Screening for cervical cancer    Tremor, essential    treated with primidone 25 mg qhs   Past Surgical History:  Procedure Laterality Date   ABDOMINAL HYSTERECTOMY  1967   BIOPSY BREAST     benign,. right breast ([place])   BREAST BIOPSY Right 2012   NEG   BREAST BIOPSY Left 2007   NEG   BREAST SURGERY  [place], benign biopsies   ROTATOR CUFF REPAIR  2004   right shoulder , [place]   Family History  Problem Relation Age of Onset   Diabetes Mother    Parkinson's disease Father    Dementia Father    Diabetes Son    Heart block Sister    Breast cancer Neg Hx    Social History   Socioeconomic History   Marital status: Married    Spouse name: Not on file   Number of children: Not on file   Years of education: Not on file   Highest education level: Not on file  Occupational History   Not on file  Tobacco Use   Smoking status: Former    Types: Cigarettes    Quit date: 09/03/1999    Years since quitting: 22.9   Smokeless tobacco: Never  Vaping Use   Vaping Use: Never used   Substance and Sexual Activity   Alcohol use: Yes    Alcohol/week: 0.0 standard drinks of alcohol    Comment: OCC   Drug use: No   Sexual activity: Never  Other Topics Concern   Not on file  Social History Narrative   Not on file   Social Determinants of Health   Financial Resource Strain: Low Risk  (08/20/2022)   Overall Financial Resource Strain (CARDIA)    Difficulty of Paying Living Expenses: Not hard at all  Food Insecurity: No Food Insecurity (08/20/2022)   Hunger Vital Sign    Worried About Running Out of Food in the Last Year: Never true    Ran Out of Food in the Last Year: Never true  Transportation Needs: No Transportation Needs (08/20/2022)   PRAPARE - Hydrologist (Medical): No    Lack of Transportation (Non-Medical): No  Physical Activity: Sufficiently Active (08/20/2022)   Exercise Vital Sign    Days of Exercise per Week: 5 days    Minutes of Exercise per Session: 30 min  Stress: No Stress Concern Present (08/20/2022)   [place]    Feeling of Stress : Not at all  Social Connections: Moderately Integrated (08/20/2022)   Social Connection and Isolation Panel [NHANES]    Frequency of Communication with Friends and Family: More than three times a week    Frequency of Social Gatherings with Friends and Family: More than three times a week    Attends Religious Services: More than 4 times per year    Active Member of [employer] or Organizations: Yes    Attends Archivist Meetings: More than 4 times per year    Marital Status: Widowed    Tobacco Counseling Counseling given: Not Answered   Clinical Intake:  Pre-visit preparation completed: Yes        Diabetes: No  How often do you need to have someone help you when you read instructions, pamphlets, or other written materials from your doctor or pharmacy?: 1 - Never    Interpreter Needed?: No       Activities of Daily Living    08/20/2022    3:44 PM  In your present state of health, do you have any difficulty performing the following activities:  Hearing? 0  Vision? 0  Difficulty concentrating or making decisions? 0  Walking or climbing stairs? 0  Dressing or bathing? 0  Doing errands, shopping? 0  Preparing Food and eating ? N  Using the Toilet? N  In the past six months, have  you accidently leaked urine? N  Do you have problems with loss of bowel control? N  Managing your Medications? N  Managing your Finances? N  Housekeeping or managing your Housekeeping? N    Patient Care Team: Crecencio Mc, MD as PCP - General (Internal Medicine)  Indicate any recent Medical Services you may have received from other than [facility] providers in the past year (date may be approximate).     Assessment:   This is a routine wellness examination for Vanessa Brown.  I connected with  Charna Busman on 08/20/22 by a audio enabled telemedicine application and verified that I am speaking with the correct person using two identifiers.  Patient Location: Home  Provider Location: Office/Clinic  I discussed the limitations of evaluation and management by telemedicine. The patient expressed understanding and agreed to proceed.   Hearing/Vision screen Hearing Screening - Comments:: Patient is able to hear conversational tones without difficulty. No issues reported. Vision Screening - Comments:: Followed by Dr. Ellin Mayhew  Wears corrective lenses  They have regular follow up with the ophthalmologist  Dietary issues and exercise activities discussed: Current Exercise Habits: Home exercise routine, Time (Minutes): 30, Frequency (Times/Week): 5, Weekly Exercise (Minutes/Week): 150, Intensity: Mild   Goals Addressed             This Visit's Progress    Follow up with Primary Care Provider       As needed.       Depression Screen    08/20/2022    3:40 PM 04/10/2022   11:31 AM 02/12/2021   10:47  AM 12/26/2019   11:28 AM 12/26/2019   11:24 AM 12/24/2018   11:35 AM 12/22/2017   11:44 AM  PHQ 2/9 Scores  PHQ - 2 Score 0 0 0 0 0 0 0    Fall Risk    08/20/2022    3:42 PM 04/10/2022   11:31 AM 02/12/2021   10:52 AM 08/24/2020    2:34 PM 03/21/2020    1:17 PM  [place] in the past year? 0 0 0 0 0  Number falls in past yr: 0  0  0  Injury with Fall? 0  0  0  Risk for fall due to : No Fall Risks No Fall Risks     Follow up Falls evaluation completed;Falls prevention discussed Falls evaluation completed Falls evaluation completed Falls evaluation completed Falls evaluation completed    FALL RISK PREVENTION PERTAINING TO THE HOME: Home free of loose throw rugs in walkways, pet beds, electrical cords, etc? Yes  Adequate lighting in your home to reduce risk of falls? Yes   ASSISTIVE DEVICES UTILIZED TO PREVENT FALLS: Life alert? No  Use of a cane, walker or w/c? No  Grab bars in the bathroom? No  Shower chair or bench in shower? Yes  Elevated toilet seat or a handicapped toilet? No   TIMED UP AND GO: Was the test performed? No .    Cognitive Function:        08/20/2022    3:45 PM 02/12/2021   10:58 AM 12/26/2019   11:28 AM 12/24/2018   11:37 AM 12/22/2017   12:03 PM  6CIT Screen  What Year? 0 points 0 points 0 points 0 points 0 points  What month? 0 points 0 points 0 points 0 points 0 points  What time? 0 points 0 points 0 points 0 points 0 points  Count back from 20 0 points 0 points 0 points 0  points 0 points  Months in reverse 0 points  0 points 0 points 0 points  Repeat phrase 0 points   0 points 0 points  Total Score 0 points   0 points 0 points    Immunizations Immunization History  Administered Date(s) Administered   Influenza Split 08/25/2011, 07/24/2014   Influenza, High Dose Seasonal PF 06/20/2022   Influenza-Unspecified 08/03/2012, 07/25/2015, 08/05/2016, 08/03/2017, 12/22/2017, 07/22/2018, 08/03/2020, 08/08/2021   PFIZER(Purple Top)SARS-COV-2 Vaccination  11/23/2019, 12/18/2019   Pneumococcal Conjugate-13 11/22/2014   Pneumococcal Polysaccharide-23 11/21/2006   Zoster Recombinat (Shingrix) 06/17/2022    TDAP status: Due, Education has been provided regarding the importance of this vaccine. Advised may receive this vaccine at local pharmacy or Health Dept. Aware to provide a copy of the vaccination record if obtained from local pharmacy or Health Dept. Verbalized acceptance and understanding.  Shingles vaccine- first dose completed. Agrees to update immunization record upon completion.   Covid-19 vaccine status: Completed vaccines x2.  Screening Tests Health Maintenance  Topic Date Due   COVID-19 Vaccine (3 - Pfizer risk series) 09/05/2022 (Originally 01/15/2020)   TETANUS/TDAP  10/20/2022 (Originally 07/29/1960)   Zoster Vaccines- Shingrix (2 of 2) 11/20/2022 (Originally 08/12/2022)   Medicare Annual Wellness (AWV)  08/21/2023   Pneumonia Vaccine 78+ Years old  Completed   INFLUENZA VACCINE  Completed   DEXA SCAN  Completed   HPV VACCINES  Aged Out   COLONOSCOPY (Pts 45-56yrs Insurance coverage will need to be confirmed)  Discontinued    Health Maintenance There are no preventive care reminders to display for this patient.  Lung Cancer Screening: (Low Dose CT Chest recommended if Age 63-80 years, 30 pack-year currently smoking OR have quit w/in 15years.) does not qualify.   Hepatitis C Screening: does not qualify.  Vision Screening: Recommended annual ophthalmology exams for early detection of glaucoma and other disorders of the eye.  Dental Screening: Recommended annual dental exams for proper oral hygiene.  Community Resource Referral / Chronic Care Management: CRR required this visit?  No   CCM required this visit?  No      Plan:     I have personally reviewed and noted the following in the patient's chart:   Medical and social history Use of alcohol, tobacco or illicit drugs  Current medications and supplements  including opioid prescriptions. Patient is not currently taking opioid prescriptions. Functional ability and status Nutritional status Physical activity Advanced directives List of other physicians Hospitalizations, surgeries, and ER visits in previous 12 months Vitals Screenings to include cognitive, depression, and falls Referrals and appointments  In addition, I have reviewed and discussed with patient certain preventive protocols, quality metrics, and best practice recommendations. A written personalized care plan for preventive services as well as general preventive health recommendations were provided to patient.     [place], LPN   19/03/2228      I have reviewed the above information and agree with above.   Deborra Medina, MD

## 2022-08-20 NOTE — Patient Instructions (Addendum)
Vanessa Brown , Thank you for taking time to come for your Medicare Wellness Visit. I appreciate your ongoing commitment to your health goals. Please review the following plan we discussed and let me know if I can assist you in the future.   These are the goals we discussed:  Goals      Follow up with Primary Care Provider     As needed.        This is a list of the screening recommended for you and due dates:  Health Maintenance  Topic Date Due   COVID-19 Vaccine (3 - Pfizer risk series) 09/05/2022*   Tetanus Vaccine  10/20/2022*   Zoster (Shingles) Vaccine (2 of 2) 11/20/2022*   Medicare Annual Wellness Visit  08/21/2023   Pneumonia Vaccine  Completed   Flu Shot  Completed   DEXA scan (bone density measurement)  Completed   HPV Vaccine  Aged Out   Colon Cancer Screening  Discontinued  *Topic was postponed. The date shown is not the original due date.    Advanced directives: End of life planning; Advance aging; Advanced directives discussed.  Copy of current HCPOA/Living Will requested.    Conditions/risks identified: none new.  Next appointment: Follow up in one year for your annual wellness visit.  Preventive Care 48 Years and Older, Female Preventive care refers to lifestyle choices and visits with your health care provider that can promote health and wellness. What does preventive care include? A yearly physical exam. This is also called an annual well check. Dental exams once or twice a year. Routine eye exams. Ask your health care provider how often you should have your eyes checked. Personal lifestyle choices, including: Daily care of your teeth and gums. Regular physical activity. Eating a healthy diet. Avoiding tobacco and drug use. Limiting alcohol use. Practicing safe sex. Taking low-dose aspirin every day. Taking vitamin and mineral supplements as recommended by your health care provider. What happens during an annual well check? The services and screenings  done by your health care provider during your annual well check will depend on your age, overall health, lifestyle risk factors, and family history of disease. Counseling  Your health care provider may ask you questions about your: Alcohol use. Tobacco use. Drug use. Emotional well-being. Home and relationship well-being. Sexual activity. Eating habits. History of falls. Memory and ability to understand (cognition). Work and work Statistician. Reproductive health. Screening  You may have the following tests or measurements: Height, weight, and BMI. Blood pressure. Lipid and cholesterol levels. These may be checked every 5 years, or more frequently if you are over 17 years old. Skin check. Lung cancer screening. You may have this screening every year starting at age 3 if you have a 30-pack-year history of smoking and currently smoke or have quit within the past 15 years. Fecal occult blood test (FOBT) of the stool. You may have this test every year starting at age 85. Flexible sigmoidoscopy or colonoscopy. You may have a sigmoidoscopy every 5 years or a colonoscopy every 10 years starting at age 59. Hepatitis C blood test. Hepatitis B blood test. Sexually transmitted disease (STD) testing. Diabetes screening. This is done by checking your blood sugar (glucose) after you have not eaten for a while (fasting). You may have this done every 1-3 years. Bone density scan. This is done to screen for osteoporosis. You may have this done starting at age 59. Mammogram. This may be done every 1-2 years. Talk to your health care provider  about how often you should have regular mammograms. Talk with your health care provider about your test results, treatment options, and if necessary, the need for more tests. Vaccines  Your health care provider may recommend certain vaccines, such as: Influenza vaccine. This is recommended every year. Tetanus, diphtheria, and acellular pertussis (Tdap, Td)  vaccine. You may need a Td booster every 10 years. Zoster vaccine. You may need this after age 29. Pneumococcal 13-valent conjugate (PCV13) vaccine. One dose is recommended after age 55. Pneumococcal polysaccharide (PPSV23) vaccine. One dose is recommended after age 70. Talk to your health care provider about which screenings and vaccines you need and how often you need them. This information is not intended to replace advice given to you by your health care provider. Make sure you discuss any questions you have with your health care provider. Document Released: 11/02/2015 Document Revised: 06/25/2016 Document Reviewed: 08/07/2015 Elsevier Interactive Patient Education  2017 [place] Prevention in the Home Falls can cause injuries. They can happen to people of all ages. There are many things you can do to make your home safe and to help prevent falls. What can I do on the outside of my home? Regularly fix the edges of walkways and driveways and fix any cracks. Remove anything that might make you trip as you walk through a door, such as a raised step or threshold. Trim any bushes or trees on the path to your home. Use bright outdoor lighting. Clear any walking paths of anything that might make someone trip, such as rocks or tools. Regularly check to see if handrails are loose or broken. Make sure that both sides of any steps have handrails. Any raised decks and porches should have guardrails on the edges. Have any leaves, snow, or ice cleared regularly. Use sand or salt on walking paths during winter. Clean up any spills in your garage right away. This includes oil or grease spills. What can I do in the bathroom? Use night lights. Install grab bars by the toilet and in the tub and shower. Do not use towel bars as grab bars. Use non-skid mats or decals in the tub or shower. If you need to sit down in the shower, use a plastic, non-slip stool. Keep the floor dry. Clean up any  water that spills on the floor as soon as it happens. Remove soap buildup in the tub or shower regularly. Attach bath mats securely with double-sided non-slip rug tape. Do not have throw rugs and other things on the floor that can make you trip. What can I do in the bedroom? Use night lights. Make sure that you have a light by your bed that is easy to reach. Do not use any sheets or blankets that are too big for your bed. They should not hang down onto the floor. Have a firm chair that has side arms. You can use this for support while you get dressed. Do not have throw rugs and other things on the floor that can make you trip. What can I do in the kitchen? Clean up any spills right away. Avoid walking on wet floors. Keep items that you use a lot in easy-to-reach places. If you need to reach something above you, use a strong step stool that has a grab bar. Keep electrical cords out of the way. Do not use floor polish or wax that makes floors slippery. If you must use wax, use non-skid floor wax. Do not have throw rugs and  other things on the floor that can make you trip. What can I do with my stairs? Do not leave any items on the stairs. Make sure that there are handrails on both sides of the stairs and use them. Fix handrails that are broken or loose. Make sure that handrails are as long as the stairways. Check any carpeting to make sure that it is firmly attached to the stairs. Fix any carpet that is loose or worn. Avoid having throw rugs at the top or bottom of the stairs. If you do have throw rugs, attach them to the floor with carpet tape. Make sure that you have a light switch at the top of the stairs and the bottom of the stairs. If you do not have them, ask someone to add them for you. What else can I do to help prevent falls? Wear shoes that: Do not have high heels. Have rubber bottoms. Are comfortable and fit you well. Are closed at the toe. Do not wear sandals. If you use a  stepladder: Make sure that it is fully opened. Do not climb a closed stepladder. Make sure that both sides of the stepladder are locked into place. Ask someone to hold it for you, if possible. Clearly mark and make sure that you can see: Any grab bars or handrails. First and last steps. Where the edge of each step is. Use tools that help you move around (mobility aids) if they are needed. These include: Canes. Walkers. Scooters. Crutches. Turn on the lights when you go into a dark area. Replace any light bulbs as soon as they burn out. Set up your furniture so you have a clear path. Avoid moving your furniture around. If any of your floors are uneven, fix them. If there are any pets around you, be aware of where they are. Review your medicines with your doctor. Some medicines can make you feel dizzy. This can increase your chance of falling. Ask your doctor what other things that you can do to help prevent falls. This information is not intended to replace advice given to you by your health care provider. Make sure you discuss any questions you have with your health care provider. Document Released: 08/02/2009 Document Revised: 03/13/2016 Document Reviewed: 11/10/2014 Elsevier Interactive Patient Education  2017 [employer].

## 2022-09-01 ENCOUNTER — Other Ambulatory Visit (INDEPENDENT_AMBULATORY_CARE_PROVIDER_SITE_OTHER): Payer: Medicare Other

## 2022-09-01 DIAGNOSIS — E785 Hyperlipidemia, unspecified: Secondary | ICD-10-CM | POA: Diagnosis not present

## 2022-09-01 DIAGNOSIS — E039 Hypothyroidism, unspecified: Secondary | ICD-10-CM | POA: Diagnosis not present

## 2022-09-01 DIAGNOSIS — R944 Abnormal results of kidney function studies: Secondary | ICD-10-CM

## 2022-09-01 DIAGNOSIS — D649 Anemia, unspecified: Secondary | ICD-10-CM | POA: Diagnosis not present

## 2022-09-01 DIAGNOSIS — D721 Eosinophilia, unspecified: Secondary | ICD-10-CM

## 2022-09-01 LAB — LIPID PANEL
Cholesterol: 304 mg/dL — ABNORMAL HIGH (ref 0–200)
HDL: 87.7 mg/dL (ref >39.00)
LDL Cholesterol: 200 mg/dL — ABNORMAL HIGH (ref 0–99)
NonHDL: 215.83
Total CHOL/HDL Ratio: 3
Triglycerides: 79 mg/dL (ref 0.0–149.0)
VLDL: 15.8 mg/dL (ref 0.0–40.0)

## 2022-09-01 LAB — TSH: TSH: 3.72 u[IU]/mL (ref 0.35–5.50)

## 2022-09-01 LAB — COMPREHENSIVE METABOLIC PANEL
ALT: 14 U/L (ref 0–35)
AST: 18 U/L (ref 0–37)
Albumin: 4.4 g/dL (ref 3.5–5.2)
Alkaline Phosphatase: 111 U/L (ref 39–117)
BUN: 21 mg/dL (ref 6–23)
CO2: 29 mEq/L (ref 19–32)
Calcium: 9.5 mg/dL (ref 8.4–10.5)
Chloride: 97 mEq/L (ref 96–112)
Creatinine, Ser: 0.97 mg/dL (ref 0.40–1.20)
GFR: 54.58 mL/min — ABNORMAL LOW (ref >60.00)
Glucose, Bld: 90 mg/dL (ref 70–99)
Potassium: 4.5 mEq/L (ref 3.5–5.1)
Sodium: 134 mEq/L — ABNORMAL LOW (ref 135–145)
Total Bilirubin: 0.3 mg/dL (ref 0.2–1.2)
Total Protein: 7.2 g/dL (ref 6.0–8.3)

## 2022-09-01 LAB — CBC WITH DIFFERENTIAL/PLATELET
Basophils Absolute: 0.1 10*3/uL (ref 0.0–0.1)
Basophils Relative: 1.3 % (ref 0.0–3.0)
Eosinophils Absolute: 0.9 10*3/uL — ABNORMAL HIGH (ref 0.0–0.7)
Eosinophils Relative: 9.5 % — ABNORMAL HIGH (ref 0.0–5.0)
HCT: 36.7 % (ref 36.0–46.0)
Hemoglobin: 12.3 g/dL (ref 12.0–15.0)
Lymphocytes Relative: 21 % (ref 12.0–46.0)
Lymphs Abs: 2 10*3/uL (ref 0.7–4.0)
MCHC: 33.6 g/dL (ref 30.0–36.0)
MCV: 104.1 fl — ABNORMAL HIGH (ref 78.0–100.0)
Monocytes Absolute: 0.8 10*3/uL (ref 0.1–1.0)
Monocytes Relative: 8 % (ref 3.0–12.0)
Neutro Abs: 5.8 10*3/uL (ref 1.4–7.7)
Neutrophils Relative %: 60.2 % (ref 43.0–77.0)
Platelets: 411 10*3/uL — ABNORMAL HIGH (ref 150.0–400.0)
RBC: 3.52 Mil/uL — ABNORMAL LOW (ref 3.87–5.11)
RDW: 13.9 % (ref 11.5–15.5)
WBC: 9.6 10*3/uL (ref 4.0–10.5)

## 2022-09-02 NOTE — Addendum Note (Signed)
Addended by: Sherlene Shams on: 09/02/2022 09:44 PM   Modules accepted: Orders

## 2022-09-10 ENCOUNTER — Telehealth: Payer: Self-pay

## 2022-09-10 NOTE — Telephone Encounter (Signed)
Spoke with pt to let her know that we are aware that she does not use her mychart.

## 2022-09-10 NOTE — Telephone Encounter (Signed)
Patient states she wants to be sure we are aware that she cannot get on MyChart.

## 2022-09-24 ENCOUNTER — Other Ambulatory Visit: Payer: Self-pay | Admitting: Internal Medicine

## 2022-11-03 ENCOUNTER — Other Ambulatory Visit: Payer: Medicare Other

## 2022-11-03 ENCOUNTER — Other Ambulatory Visit: Payer: Self-pay | Admitting: Internal Medicine

## 2022-11-03 DIAGNOSIS — E039 Hypothyroidism, unspecified: Secondary | ICD-10-CM

## 2022-11-05 DIAGNOSIS — H0288A Meibomian gland dysfunction right eye, upper and lower eyelids: Secondary | ICD-10-CM | POA: Diagnosis not present

## 2022-11-05 DIAGNOSIS — H2513 Age-related nuclear cataract, bilateral: Secondary | ICD-10-CM | POA: Diagnosis not present

## 2022-11-05 DIAGNOSIS — H5203 Hypermetropia, bilateral: Secondary | ICD-10-CM | POA: Diagnosis not present

## 2022-11-05 DIAGNOSIS — H0288B Meibomian gland dysfunction left eye, upper and lower eyelids: Secondary | ICD-10-CM | POA: Diagnosis not present

## 2022-12-08 ENCOUNTER — Other Ambulatory Visit (INDEPENDENT_AMBULATORY_CARE_PROVIDER_SITE_OTHER): Payer: Medicare Other

## 2022-12-08 DIAGNOSIS — R944 Abnormal results of kidney function studies: Secondary | ICD-10-CM

## 2022-12-08 DIAGNOSIS — D649 Anemia, unspecified: Secondary | ICD-10-CM

## 2022-12-08 LAB — CBC WITH DIFFERENTIAL/PLATELET
Basophils Absolute: 0.1 10*3/uL (ref 0.0–0.1)
Basophils Relative: 1.4 % (ref 0.0–3.0)
Eosinophils Absolute: 0.3 10*3/uL (ref 0.0–0.7)
Eosinophils Relative: 5.4 % — ABNORMAL HIGH (ref 0.0–5.0)
HCT: 37.7 % (ref 36.0–46.0)
Hemoglobin: 12.6 g/dL (ref 12.0–15.0)
Lymphocytes Relative: 39.6 % (ref 12.0–46.0)
Lymphs Abs: 2.4 10*3/uL (ref 0.7–4.0)
MCHC: 33.5 g/dL (ref 30.0–36.0)
MCV: 103.8 fl — ABNORMAL HIGH (ref 78.0–100.0)
Monocytes Absolute: 0.5 10*3/uL (ref 0.1–1.0)
Monocytes Relative: 8.1 % (ref 3.0–12.0)
Neutro Abs: 2.8 10*3/uL (ref 1.4–7.7)
Neutrophils Relative %: 45.5 % (ref 43.0–77.0)
Platelets: 371 10*3/uL (ref 150.0–400.0)
RBC: 3.63 Mil/uL — ABNORMAL LOW (ref 3.87–5.11)
RDW: 14.1 % (ref 11.5–15.5)
WBC: 6.1 10*3/uL (ref 4.0–10.5)

## 2022-12-08 LAB — BASIC METABOLIC PANEL
BUN: 14 mg/dL (ref 6–23)
CO2: 29 mEq/L (ref 19–32)
Calcium: 9.4 mg/dL (ref 8.4–10.5)
Chloride: 99 mEq/L (ref 96–112)
Creatinine, Ser: 0.89 mg/dL (ref 0.40–1.20)
GFR: 60.4 mL/min (ref >60.00)
Glucose, Bld: 90 mg/dL (ref 70–99)
Potassium: 4.4 mEq/L (ref 3.5–5.1)
Sodium: 136 mEq/L (ref 135–145)

## 2022-12-09 ENCOUNTER — Telehealth: Payer: Self-pay | Admitting: Internal Medicine

## 2022-12-09 NOTE — Telephone Encounter (Signed)
Spoke with pt and informed her of her lab results. Pt gave a verbal understanding.  

## 2022-12-09 NOTE — Telephone Encounter (Signed)
Your CBC,  electrolytes  and kidney function have been reviewed and there are no significant or worrisome findings . Vanessa Brown  Please continue your current medications and  plan to repeat  fasting labs in 6 months.   Regards,  Dr. Derrel Nip

## 2022-12-12 ENCOUNTER — Ambulatory Visit: Payer: Medicare Other | Admitting: Internal Medicine

## 2022-12-17 ENCOUNTER — Ambulatory Visit (INDEPENDENT_AMBULATORY_CARE_PROVIDER_SITE_OTHER): Payer: Medicare Other | Admitting: Internal Medicine

## 2022-12-17 ENCOUNTER — Encounter: Payer: Self-pay | Admitting: Internal Medicine

## 2022-12-17 VITALS — BP 136/72 | HR 56 | Temp 97.5°F | Ht 61.0 in | Wt 119.8 lb

## 2022-12-17 DIAGNOSIS — K589 Irritable bowel syndrome without diarrhea: Secondary | ICD-10-CM

## 2022-12-17 DIAGNOSIS — G250 Essential tremor: Secondary | ICD-10-CM

## 2022-12-17 DIAGNOSIS — E039 Hypothyroidism, unspecified: Secondary | ICD-10-CM | POA: Diagnosis not present

## 2022-12-17 DIAGNOSIS — M85859 Other specified disorders of bone density and structure, unspecified thigh: Secondary | ICD-10-CM | POA: Diagnosis not present

## 2022-12-17 DIAGNOSIS — G629 Polyneuropathy, unspecified: Secondary | ICD-10-CM | POA: Diagnosis not present

## 2022-12-17 DIAGNOSIS — E785 Hyperlipidemia, unspecified: Secondary | ICD-10-CM

## 2022-12-17 DIAGNOSIS — R0683 Snoring: Secondary | ICD-10-CM

## 2022-12-17 DIAGNOSIS — Z780 Asymptomatic menopausal state: Secondary | ICD-10-CM

## 2022-12-17 DIAGNOSIS — N951 Menopausal and female climacteric states: Secondary | ICD-10-CM

## 2022-12-17 NOTE — Progress Notes (Unsigned)
Subjective:  Patient ID: Vanessa Brown, female    DOB: [birth year]  Age: 82 y.o. MRN: [id number]  CC: The primary encounter diagnosis was Hyperlipidemia LDL goal <130. Diagnoses of Acquired hypothyroidism, Osteopenia of thigh, unspecified laterality, Postmenopausal estrogen deficiency, and Irritable bowel syndrome without diarrhea were also pertinent to this visit.   HPI Vanessa Brown presents for  Chief Complaint  Patient presents with   Medical Management of Chronic Issues    Follow up on lab results   1) decreased GFR:  resolved with increased fluid intake  2) IBS,  quiescent    3) toilet seat neuroapthy:  disganosed by Manuella Ghazi.  No treatment  4) intention tremor:  using primidone and inderal  5) discussion about whether to continue breast cancer screening   6) osteoppenia: last DEXA 2107 wed.   Outpatient Medications Prior to Visit  Medication Sig Dispense Refill   Biotin 5000 MCG CAPS Take 1 capsule by mouth daily.     cholecalciferol (VITAMIN D) 1000 units tablet Take 1,000 Units by mouth daily.     cyanocobalamin 1000 MCG tablet Take 1,000 mcg by mouth daily.     estradiol (CLIMARA - DOSED IN MG/24 HR) 0.025 mg/24hr patch Place 1 patch (0.025 mg total) onto the skin once a week. 4 patch 2   levothyroxine (SYNTHROID) 50 MCG tablet Take 1 tablet (50 mcg total) by mouth daily. 30 tablet 0   Multiple Vitamins-Minerals (MULTIVITAMIN ADULT EXTRA C PO) Take by mouth.     primidone (MYSOLINE) 250 MG tablet Take 1 tablet by mouth 2 (two) times daily.     primidone (MYSOLINE) 50 MG tablet Take 250 mg by mouth 2 (two) times daily.     propranolol (INDERAL) 40 MG tablet Take 40 mg by mouth 2 (two) times daily.     No facility-administered medications prior to visit.    Review of Systems;  Patient denies headache, fevers, malaise, unintentional weight loss, skin rash, eye pain, sinus congestion and sinus pain, sore throat, dysphagia,  hemoptysis , cough, dyspnea, wheezing, chest pain,  palpitations, orthopnea, edema, abdominal pain, nausea, melena, diarrhea, constipation, flank pain, dysuria, hematuria, urinary  Frequency, nocturia, numbness, tingling, seizures,  Focal weakness, Loss of consciousness,  Tremor, insomnia, depression, anxiety, and suicidal ideation.      Objective:  BP 136/72   Pulse (!) 56   Temp (!) 97.5 F (36.4 C) (Oral)   Ht '5\' 1"'$  (1.549 m)   Wt 119 lb 12.8 oz (54.3 kg)   SpO2 99%   BMI 22.64 kg/m   BP Readings from Last 3 Encounters:  12/17/22 136/72  04/10/22 140/78  03/05/21 116/64    Wt Readings from Last 3 Encounters:  12/17/22 119 lb 12.8 oz (54.3 kg)  08/20/22 119 lb (54 kg)  04/10/22 119 lb 6.4 oz (54.2 kg)    Physical Exam  Lab Results  Component Value Date   HGBA1C 5.6 04/10/2022    Lab Results  Component Value Date   CREATININE 0.89 12/08/2022   CREATININE 0.97 09/01/2022   CREATININE 0.96 04/10/2022    Lab Results  Component Value Date   WBC 6.1 12/08/2022   HGB 12.6 12/08/2022   HCT 37.7 12/08/2022   PLT 371.0 12/08/2022   GLUCOSE 90 12/08/2022   CHOL 304 (H) 09/01/2022   TRIG 79.0 09/01/2022   HDL 87.70 09/01/2022   LDLDIRECT 167.0 04/10/2022   LDLCALC 200 (H) 09/01/2022   ALT 14 09/01/2022   AST 18 09/01/2022  NA 136 12/08/2022   K 4.4 12/08/2022   CL 99 12/08/2022   CREATININE 0.89 12/08/2022   BUN 14 12/08/2022   CO2 29 12/08/2022   TSH 3.72 09/01/2022   HGBA1C 5.6 04/10/2022    MM 3D SCREEN BREAST BILATERAL  Result Date: 10/29/2021 CLINICAL DATA:  Screening. EXAM: DIGITAL SCREENING BILATERAL MAMMOGRAM WITH TOMOSYNTHESIS AND CAD TECHNIQUE: Bilateral screening digital craniocaudal and mediolateral oblique mammograms were obtained. Bilateral screening digital breast tomosynthesis was performed. The images were evaluated with computer-aided detection. COMPARISON:  Previous exam(s). ACR Breast Density Category c: The breast tissue is heterogeneously dense, which may obscure small masses. FINDINGS:  There are no findings suspicious for malignancy. IMPRESSION: No mammographic evidence of malignancy. A result letter of this screening mammogram will be mailed directly to the patient. RECOMMENDATION: Screening mammogram in one year. (Code:SM-B-01Y) BI-RADS CATEGORY  1: Negative. Electronically Signed   By: Marin Olp M.D.   On: 10/29/2021 13:55   Assessment & Plan:  .Hyperlipidemia LDL goal <130 Assessment & Plan: Her ten year risk of CAD /CVA is 33% based on most recent lipid profile applied to the [facility] calculator.  Statin has been recommended as primary prevention in the past but declined; reviewed again today.   Lab Results  Component Value Date   CHOL 304 (H) 09/01/2022   HDL 87.70 09/01/2022   LDLCALC 200 (H) 09/01/2022   LDLDIRECT 167.0 04/10/2022   TRIG 79.0 09/01/2022   CHOLHDL 3 09/01/2022      Acquired hypothyroidism Assessment & Plan: Thyroid function is WNL on  50 mcg daily dose.  No current changes needed.   Lab Results  Component Value Date   TSH 3.72 09/01/2022      Osteopenia of thigh, unspecified laterality Assessment & Plan: T score in femur   was-1.4 by 2017 DEXA (was -1.1 in 2009) .  Taking HRT for menopause syndrome. Discussed the current controversies surrounding the risks and benefits of calcium supplementation.  Encouraged her to increase dietary calcium through natural foods including almond/coconut milk   Postmenopausal estrogen deficiency -     DG Bone Density; Future  Irritable bowel syndrome without diarrhea Assessment & Plan: Symptoms have been  quiescent       I provided 30 minutes of face-to-face time during this encounter reviewing patient's last visit with me, patient's  most recent visit with cardiology,  nephrology,  and neurology,  recent surgical and non surgical procedures, previous  labs and imaging studies, counseling on currently addressed issues,  and post visit ordering to diagnostics and therapeutics .   Follow-up: No  follow-ups on file.   Crecencio Mc, MD

## 2022-12-17 NOTE — Assessment & Plan Note (Signed)
T score in femur   was-1.4 by 2017 DEXA (was -1.1 in 2009) .  Taking HRT for menopause syndrome. Discussed the current controversies surrounding the risks and benefits of calcium supplementation.  Encouraged her to increase dietary calcium through natural foods including almond/coconut milk

## 2022-12-17 NOTE — Assessment & Plan Note (Signed)
She manages flares with salon pas with lidocaine. No recent ESI

## 2022-12-17 NOTE — Patient Instructions (Addendum)
Your kidney function has returned to normal, so you were likely mildly dehydrated  in November   We should repeat your DEXA this year  to screen for bone loss  You should continue screening mammograms for as long as you think you would be willing to have treatment if a cancer was found  You do not need any more colonoscopies   Return in 6 months

## 2022-12-17 NOTE — Assessment & Plan Note (Signed)
Her ten year risk of CAD /CVA is 33% based on most recent lipid profile applied to the [facility] calculator.  Statin has been recommended as primary prevention in the past but declined; reviewed again today.   Lab Results  Component Value Date   CHOL 304 (H) 09/01/2022   HDL 87.70 09/01/2022   LDLCALC 200 (H) 09/01/2022   LDLDIRECT 167.0 04/10/2022   TRIG 79.0 09/01/2022   CHOLHDL 3 09/01/2022

## 2022-12-17 NOTE — Assessment & Plan Note (Signed)
Managed by  Dr Manuella Ghazi;  b12/folate levels are normal

## 2022-12-17 NOTE — Assessment & Plan Note (Signed)
Symptoms have been  quiescent .

## 2022-12-17 NOTE — Assessment & Plan Note (Signed)
Thyroid function is WNL on  50 mcg daily dose.  No current changes needed.   Lab Results  Component Value Date   TSH 3.72 09/01/2022

## 2022-12-18 NOTE — Assessment & Plan Note (Signed)
Managed with estradiol patch changed  Weekly for quality  of life

## 2022-12-18 NOTE — Assessment & Plan Note (Signed)
Symptoms are mild and no not prevent her from leading an active lifestyle.

## 2022-12-18 NOTE — Assessment & Plan Note (Signed)
Advised to purchase a chin strap to prevent dry throat

## 2022-12-22 ENCOUNTER — Other Ambulatory Visit: Payer: Self-pay | Admitting: Internal Medicine

## 2023-01-30 ENCOUNTER — Other Ambulatory Visit: Payer: Self-pay | Admitting: Internal Medicine

## 2023-02-02 ENCOUNTER — Telehealth: Payer: Self-pay | Admitting: Internal Medicine

## 2023-02-02 NOTE — Telephone Encounter (Signed)
Pt is calling in stating that she found out about her billing concern that she was having since last week and found out it goes to [place] and not to the office in [place].  Pt stated that she had billed out her payment on 01/22/2023 and it had not cleared her bank and she did not know if she should put a "stop payment" on it.

## 2023-02-10 ENCOUNTER — Other Ambulatory Visit: Payer: Self-pay | Admitting: Internal Medicine

## 2023-02-10 DIAGNOSIS — E039 Hypothyroidism, unspecified: Secondary | ICD-10-CM

## 2023-04-09 DIAGNOSIS — M542 Cervicalgia: Secondary | ICD-10-CM | POA: Diagnosis not present

## 2023-04-09 DIAGNOSIS — M65332 Trigger finger, left middle finger: Secondary | ICD-10-CM | POA: Diagnosis not present

## 2023-04-09 DIAGNOSIS — G250 Essential tremor: Secondary | ICD-10-CM | POA: Diagnosis not present

## 2023-04-09 DIAGNOSIS — H9202 Otalgia, left ear: Secondary | ICD-10-CM | POA: Diagnosis not present

## 2023-04-14 ENCOUNTER — Ambulatory Visit: Payer: Medicare Other | Admitting: Internal Medicine

## 2023-05-18 DIAGNOSIS — M48062 Spinal stenosis, lumbar region with neurogenic claudication: Secondary | ICD-10-CM | POA: Diagnosis not present

## 2023-05-18 DIAGNOSIS — M5416 Radiculopathy, lumbar region: Secondary | ICD-10-CM | POA: Diagnosis not present

## 2023-08-25 DIAGNOSIS — Z23 Encounter for immunization: Secondary | ICD-10-CM | POA: Diagnosis not present

## 2023-11-25 ENCOUNTER — Ambulatory Visit (INDEPENDENT_AMBULATORY_CARE_PROVIDER_SITE_OTHER): Payer: Medicare Other | Admitting: *Deleted

## 2023-11-25 VITALS — Ht 61.0 in | Wt 120.0 lb

## 2023-11-25 DIAGNOSIS — Z0000 Encounter for general adult medical examination without abnormal findings: Secondary | ICD-10-CM | POA: Diagnosis not present

## 2023-11-25 NOTE — Patient Instructions (Signed)
 Ms. Stenglein , Thank you for taking time to come for your Medicare Wellness Visit. I appreciate your ongoing commitment to your health goals. Please review the following plan we discussed and let me know if I can assist you in the future.   Referrals/Orders/Follow-Ups/Clinician Recommendations: Remember to update your Tetanus vaccine.  This is a list of the screening recommended for you and due dates:  Health Maintenance  Topic Date Due   DTaP/Tdap/Td vaccine (1 - Tdap) Never done   COVID-19 Vaccine (3 - Pfizer risk series) 01/15/2020   Medicare Annual Wellness Visit  11/24/2024   Pneumonia Vaccine  Completed   Flu Shot  Completed   DEXA scan (bone density measurement)  Completed   Zoster (Shingles) Vaccine  Completed   HPV Vaccine  Aged Out   Colon Cancer Screening  Discontinued   Hepatitis C Screening  Discontinued    Advanced directives: (Copy Requested) Please bring a copy of your health care power of attorney and living will to the office to be added to your chart at your convenience.  Next Medicare Annual Wellness Visit scheduled for next year: Yes 11/29/24 @ 3:00

## 2023-11-25 NOTE — Progress Notes (Signed)
 Subjective:   Vanessa Brown is a 83 y.o. female who presents for Medicare Annual (Subsequent) preventive examination.  Visit Complete: Virtual I connected with  Vanessa Brown on 11/25/23 by a audio enabled telemedicine application and verified that I am speaking with the correct person using two identifiers. This patient declined Interactive audio and acupuncturist. Therefore the visit was completed with audio only.   Patient Location: Home  Provider Location: Office/Clinic  I discussed the limitations of evaluation and management by telemedicine. The patient expressed understanding and agreed to proceed.  Vital Signs: Because this visit was a virtual/telehealth visit, some criteria may be missing or patient reported. Any vitals not documented were not able to be obtained and vitals that have been documented are patient reported.    Cardiac Risk Factors include: advanced age (>29men, >33 women);dyslipidemia     Objective:    Today's Vitals   11/25/23 1535  Weight: 120 lb (54.4 kg)  Height: 5' 1 (1.549 m)   Body mass index is 22.67 kg/m.     11/25/2023    3:58 PM 08/20/2022    3:42 PM 02/12/2021   10:50 AM 12/26/2019   11:13 AM 12/24/2018   11:33 AM 12/22/2017   11:35 AM 12/15/2016   11:53 AM  Advanced Directives  Does Patient Have a Medical Advance Directive? Yes Yes Yes Yes Yes Yes Yes  Type of Estate Agent of [place];Living will Healthcare Power of [place] of [place];Living will Healthcare Power of [place];Living will Healthcare Power of [place];Living will Healthcare Power of [place];Living will Healthcare Power of [place];Living will  Does patient want to make changes to medical advance directive?  No - Patient declined No - Patient declined No - Patient declined No - Patient declined No - Patient declined No - Patient declined  Copy of Healthcare Power of Attorney in Chart? No - copy requested No - copy requested No - copy  requested No - copy requested No - copy requested No - copy requested No - copy requested    Current Medications (verified) Outpatient Encounter Medications as of 11/25/2023  Medication Sig   Biotin 5000 MCG CAPS Take 1 capsule by mouth daily.   cholecalciferol (VITAMIN D ) 1000 units tablet Take 1,000 Units by mouth daily.   cyanocobalamin  1000 MCG tablet Take 1,000 mcg by mouth daily.   estradiol  (CLIMARA  - DOSED IN MG/24 HR) 0.025 mg/24hr patch Place 1 patch (0.025 mg total) onto the skin once a week.   levothyroxine  (SYNTHROID ) 50 MCG tablet Take 1 tablet (50 mcg total) by mouth daily.   Multiple Vitamins-Minerals (MULTIVITAMIN ADULT EXTRA C PO) Take by mouth.   primidone (MYSOLINE) 250 MG tablet Take 1 tablet by mouth 2 (two) times daily.   propranolol (INDERAL) 40 MG tablet Take 40 mg by mouth 2 (two) times daily.   [DISCONTINUED] primidone (MYSOLINE) 50 MG tablet Take 250 mg by mouth 2 (two) times daily. (Patient not taking: Reported on 11/25/2023)   No facility-administered encounter medications on file as of 11/25/2023.    Allergies (verified) Bee venom, Iodinated contrast media, Shellfish allergy, Tilactase, Wheat, and Doxycycline    History: Past Medical History:  Diagnosis Date   Anxiety    Arthritis    spine, prior Pain Clinic patient   Cervical back pain with evidence of disc disease 2007   prior epidural steorid injection s   Duodenal ulcer    Gastric ulcer    H Pylori positive   GERD (gastroesophageal reflux  disease)    Irritable bowel syndrome (IBS)    Norleen Hint, [facility]   Neuropathy    toilet seat, [place]), caused  by sitting on commode   Screening for breast cancer    Screening for cervical cancer    Tremor, essential    treated with primidone 25 mg qhs   Past Surgical History:  Procedure Laterality Date   ABDOMINAL HYSTERECTOMY  1967   BIOPSY BREAST     benign,. right breast ([place])   BREAST BIOPSY Right 2012   NEG   BREAST BIOPSY Left 2007   NEG    BREAST SURGERY     [place], benign biopsies   ROTATOR CUFF REPAIR  2004   right shoulder , [place]   Family History  Problem Relation Age of Onset   Diabetes Mother    Parkinson's disease Father    Dementia Father    Diabetes Son    Heart block Sister    Breast cancer Neg Hx    Social History   Socioeconomic History   Marital status: Married    Spouse name: Not on file   Number of children: Not on file   Years of education: Not on file   Highest education level: Not on file  Occupational History   Not on file  Tobacco Use   Smoking status: Former    Current packs/day: 0.00    Types: Cigarettes    Quit date: 09/03/1999    Years since quitting: 24.2   Smokeless tobacco: Never  Vaping Use   Vaping status: Never Used  Substance and Sexual Activity   Alcohol use: Yes    Alcohol/week: 0.0 standard drinks of alcohol    Comment: OCC   Drug use: No   Sexual activity: Never  Other Topics Concern   Not on file  Social History Narrative   Not on file   Social Drivers of Health   Financial Resource Strain: Low Risk  (11/25/2023)   Overall Financial Resource Strain (CARDIA)    Difficulty of Paying Living Expenses: Not hard at all  Food Insecurity: No Food Insecurity (11/25/2023)   Hunger Vital Sign    Worried About Running Out of Food in the Last Year: Never true    Ran Out of Food in the Last Year: Never true  Transportation Needs: No Transportation Needs (11/25/2023)   PRAPARE - Administrator, Civil Service (Medical): No    Lack of Transportation (Non-Medical): No  Physical Activity: Sufficiently Active (11/25/2023)   Exercise Vital Sign    Days of Exercise per Week: 5 days    Minutes of Exercise per Session: 30 min  Stress: No Stress Concern Present (11/25/2023)   [employer] of Occupational Health - Occupational Stress Questionnaire    Feeling of Stress : Not at all  Social Connections: Moderately Isolated (11/25/2023)   Social Connection and Isolation  Panel [NHANES]    Frequency of Communication with Friends and Family: More than three times a week    Frequency of Social Gatherings with Friends and Family: More than three times a week    Attends Religious Services: More than 4 times per year    Active Member of [employer] or Organizations: No    Attends Banker Meetings: Never    Marital Status: Widowed    Tobacco Counseling Counseling given: Not Answered   Clinical Intake:  Pre-visit preparation completed: Yes  Pain : No/denies pain     BMI - recorded: 22.67 Nutritional  Status: BMI of 19-24  Normal Nutritional Risks: None Diabetes: No  How often do you need to have someone help you when you read instructions, pamphlets, or other written materials from your doctor or pharmacy?: 1 - Never  Interpreter Needed?: No  Information entered by :: Vanessa Brown   Activities of Daily Living    11/25/2023    3:37 PM  In your present state of health, do you have any difficulty performing the following activities:  Hearing? 0  Vision? 0  Comment contacts  Difficulty concentrating or making decisions? 0  Walking or climbing stairs? 0  Dressing or bathing? 0  Doing errands, shopping? 0  Preparing Food and eating ? N  Using the Toilet? N  In the past six months, have you accidently leaked urine? N  Do you have problems with loss of bowel control? N  Managing your Medications? N  Managing your Finances? N  Housekeeping or managing your Housekeeping? N    Patient Care Team: Vanessa Verneita CROME, MD as PCP - General (Internal Medicine)  Indicate any recent Medical Services you may have received from other than [facility] providers in the past year (date may be approximate).     Assessment:   This is a routine wellness examination for Vanessa Brown.  Hearing/Vision screen Hearing Screening - Comments:: No issues Vision Screening - Comments:: contacts   Goals Addressed             This Visit's Progress    Patient Stated        Wants to continue to do what she is doing and maintain       Depression Screen    11/25/2023    3:45 PM 08/20/2022    3:40 PM 04/10/2022   11:31 AM 02/12/2021   10:47 AM 12/26/2019   11:28 AM 12/26/2019   11:24 AM 12/24/2018   11:35 AM  PHQ 2/9 Scores  PHQ - 2 Score 0 0 0 0 0 0 0  PHQ- 9 Score 1          Fall Risk    11/25/2023    3:39 PM 12/17/2022    1:05 PM 08/20/2022    3:42 PM 04/10/2022   11:31 AM 02/12/2021   10:52 AM  Fall Risk   Falls in the past year? 0 0 0 0 0  Number falls in past yr: 0 0 0  0  Injury with Fall? 0 0 0  0  Risk for fall due to : No Fall Risks No Fall Risks No Fall Risks No Fall Risks   Follow up Falls prevention discussed;Falls evaluation completed Falls evaluation completed Falls evaluation completed;Falls prevention discussed Falls evaluation completed Falls evaluation completed    MEDICARE RISK AT HOME: Medicare Risk at Home Any stairs in or around the home?: Yes If so, are there any without handrails?: No Home free of loose throw rugs in walkways, pet beds, electrical cords, etc?: Yes Adequate lighting in your home to reduce risk of falls?: Yes Life alert?: No Use of a cane, walker or w/c?: No Grab bars in the bathroom?: No Shower chair or bench in shower?: Yes Elevated toilet seat or a handicapped toilet?: No    Cognitive Function:        11/25/2023    3:58 PM 08/20/2022    3:45 PM 02/12/2021   10:58 AM 12/26/2019   11:28 AM 12/24/2018   11:37 AM  6CIT Screen  What Year? 0 points 0 points 0 points 0  points 0 points  What month? 0 points 0 points 0 points 0 points 0 points  What time? 0 points 0 points 0 points 0 points 0 points  Count back from 20 0 points 0 points 0 points 0 points 0 points  Months in reverse 4 points 0 points  0 points 0 points  Repeat phrase 0 points 0 points   0 points  Total Score 4 points 0 points   0 points    Immunizations Immunization History  Administered Date(s) Administered   Influenza Split 08/25/2011,  07/24/2014   Influenza, High Dose Seasonal PF 06/20/2022   Influenza-Unspecified 08/03/2012, 07/25/2015, 08/05/2016, 08/03/2017, 12/22/2017, 07/22/2018, 08/03/2020, 08/08/2021, 08/25/2023   PFIZER(Purple Top)SARS-COV-2 Vaccination 11/23/2019, 12/18/2019   Pneumococcal Conjugate-13 11/22/2014   Pneumococcal Polysaccharide-23 11/21/2006   Zoster Recombinant(Shingrix) 06/17/2022, 10/22/2022    TDAP status: Due, Education has been provided regarding the importance of this vaccine. Advised may receive this vaccine at local pharmacy or Health Dept. Aware to provide a copy of the vaccination record if obtained from local pharmacy or Health Dept. Verbalized acceptance and understanding.  Flu Vaccine status: Up to date  Pneumococcal vaccine status: Up to date  Covid-19 vaccine status: Declined, Education has been provided regarding the importance of this vaccine but patient still declined. Advised may receive this vaccine at local pharmacy or Health Dept.or vaccine clinic. Aware to provide a copy of the vaccination record if obtained from local pharmacy or Health Dept. Verbalized acceptance and understanding.  Qualifies for Shingles Vaccine? Yes   Zostavax completed No   Shingrix Completed?: Yes  Screening Tests Health Maintenance  Topic Date Due   DTaP/Tdap/Td (1 - Tdap) Never done   COVID-19 Vaccine (3 - Pfizer risk series) 01/15/2020   Medicare Annual Wellness (AWV)  08/21/2023   Pneumonia Vaccine 3+ Years old  Completed   INFLUENZA VACCINE  Completed   DEXA SCAN  Completed   Zoster Vaccines- Shingrix  Completed   HPV VACCINES  Aged Out   Colonoscopy  Discontinued   Hepatitis C Screening  Discontinued    Health Maintenance  Health Maintenance Due  Topic Date Due   DTaP/Tdap/Td (1 - Tdap) Never done   COVID-19 Vaccine (3 - Pfizer risk series) 01/15/2020   Medicare Annual Wellness (AWV)  08/21/2023    Colorectal cancer screening: No longer required.   Mammogram status: No  longer required due to age.  Bone Density status: Ordered 11/2022 Patient declines at this time stating that she will have this done if she decides that she needs it.  Lung Cancer Screening: (Low Dose CT Chest recommended if Age 68-80 years, 20 pack-year currently smoking OR have quit w/in 15years.) does not qualify.     Additional Screening:  Hepatitis C Screening: does qualify; Completed 11/2014  Vision Screening: Recommended annual ophthalmology exams for early detection of glaucoma and other disorders of the eye. Is the patient up to date with their annual eye exam?  Yes  Who is the provider or what is the name of the office in which the patient attends annual eye exams? [facility] If pt is not established with a provider, would they like to be referred to a provider to establish care? No .   Dental Screening: Recommended annual dental exams for proper oral hygiene    Community Resource Referral / Chronic Care Management: CRR required this visit?  No   CCM required this visit?  No     Plan:     I have personally reviewed  and noted the following in the patient's chart:   Medical and social history Use of alcohol, tobacco or illicit drugs  Current medications and supplements including opioid prescriptions. Patient is not currently taking opioid prescriptions. Functional ability and status Nutritional status Physical activity Advanced directives List of other physicians Hospitalizations, surgeries, and ER visits in previous 12 months Vitals Screenings to include cognitive, depression, and falls Referrals and appointments  In addition, I have reviewed and discussed with patient certain preventive protocols, quality metrics, and best practice recommendations. A written personalized care plan for preventive services as well as general preventive health recommendations were provided to patient.     Angeline Fredericks, Brown   04/22/7973   After Visit Summary: (Pick Up) Due to  this being a telephonic visit, with patients personalized plan was offered to patient and patient has requested to Pick up at office.  Nurse Notes: None

## 2024-01-04 ENCOUNTER — Telehealth: Payer: Self-pay

## 2024-01-04 ENCOUNTER — Ambulatory Visit: Payer: Self-pay | Admitting: Internal Medicine

## 2024-01-04 NOTE — Telephone Encounter (Signed)
 Pt is scheduled to see Dr. Clent Ridges on 01/06/2024

## 2024-01-04 NOTE — Telephone Encounter (Signed)
 Copied from CRM [id number]. Topic: Appointments - Appointment Scheduling >> Jan 04, 2024  1:18 PM [place] J wrote: Patient needs to schedule a physical. Cannot schedule on my side due to a glitch in epic on the E2C2 side. Please schedule patient for physical. Patient completed AWV in Feburary.

## 2024-01-04 NOTE — Telephone Encounter (Signed)
  Chief Complaint: vaginal and urination burning Symptoms: burning   Disposition: [] ED /[] Urgent Care (no appt availability in office) / [x] Appointment(In office/virtual)/ []  [facility]/ [] Home Care/ [] Refused Recommended Disposition /[] [place] [facility]/ []  Follow-up with PCP Additional Notes: Pt complaining of vaginal area with burning when urination and without. Pt states this started 3/11 after eating a jalapeno dip. Pt has been using a vaginal cream and it seems to be helping. No discharge, itching, or foul smell. Pt denies pain or fever. Pt has soonest appt of 3/19 @ 0940 with Dr Clent Ridges. RN gave care advice and pt verbalized understanding.              Copied from CRM [id number]. Topic: Clinical - Red Word Triage >> Jan 04, 2024  1:25 PM [place] J wrote: [facility] that prompted transfer to Nurse Triage: Vaginal burning during urination. Patient stated that she has had vaginal ulcers before. Patient tried using vagisil but is not working. Reason for Disposition  All other urine symptoms  Answer Assessment - Initial Assessment Questions 1. SYMPTOM: "What's the main symptom you're concerned about?" (e.g., frequency, incontinence)     Burning  2. ONSET: "When did the  burning  start?"     3/11 3. PAIN: "Is there any pain?" If Yes, ask: "How bad is it?" (Scale: 1-10; mild, moderate, severe)     no 4. CAUSE: "What do you think is causing the symptoms?"     Pineapple and jalapeno dip  5. OTHER SYMPTOMS: "Do you have any other symptoms?" (e.g., blood in urine, fever, flank pain, pain with urination)     Burning with urination;  Protocols used: Urinary Symptoms-A-AH

## 2024-01-05 NOTE — Telephone Encounter (Signed)
 Spoke with patient and got her scheduled for her yearly follow up on 02/02/24 at 3:00 PM. Patient states she does have a changed in insurance and would bring it in with her to her acute visit scheduled tomorrow with Dr Clent Ridges. Patient says she would like her preferred contact information to be on her mobile number. FYI.

## 2024-01-06 ENCOUNTER — Ambulatory Visit: Admitting: Family Medicine

## 2024-01-06 ENCOUNTER — Encounter: Payer: Self-pay | Admitting: Family Medicine

## 2024-01-06 VITALS — BP 124/76 | HR 53 | Temp 98.0°F | Resp 20 | Ht 61.0 in | Wt 122.5 lb

## 2024-01-06 DIAGNOSIS — R300 Dysuria: Secondary | ICD-10-CM | POA: Diagnosis not present

## 2024-01-06 DIAGNOSIS — E039 Hypothyroidism, unspecified: Secondary | ICD-10-CM | POA: Diagnosis not present

## 2024-01-06 LAB — POC URINALSYSI DIPSTICK (AUTOMATED)
Bilirubin, UA: NEGATIVE
Glucose, UA: NEGATIVE
Ketones, UA: NEGATIVE
Nitrite, UA: NEGATIVE
Protein, UA: NEGATIVE
Spec Grav, UA: 1.01 (ref 1.010–1.025)
Urobilinogen, UA: 0.2 U/dL
pH, UA: 6 (ref 5.0–8.0)

## 2024-01-06 NOTE — Patient Instructions (Signed)
 It was a pleasure meeting you today. Thank you for allowing me to take part in your health care.  Our goals for today as we discussed include:  Sending urine for further evaluation If positive for infection will notify you and send in prescription  Increase water intake Can use AZO tablets over the counter  Cranberry juice can be helpful Stop scented soaps, lotions in vaginal area  If any worsening symptoms, ie fevers, abdominal pain, blood in urine, please notify MD   This is a list of the screening recommended for you and due dates:  Health Maintenance  Topic Date Due   DTaP/Tdap/Td vaccine (1 - Tdap) Never done   COVID-19 Vaccine (3 - Pfizer risk series) 01/15/2020   Medicare Annual Wellness Visit  11/24/2024   Pneumonia Vaccine  Completed   Flu Shot  Completed   DEXA scan (bone density measurement)  Completed   Zoster (Shingles) Vaccine  Completed   HPV Vaccine  Aged Out   Colon Cancer Screening  Discontinued   Hepatitis C Screening  Discontinued      If you have any questions or concerns, please do not hesitate to call the office at [phone].  I look forward to our next visit and until then take care and stay safe.  Regards,   Dana Allan, MD   [facility]

## 2024-01-06 NOTE — Progress Notes (Signed)
 SUBJECTIVE:   Chief Complaint  Patient presents with   Urinary Tract Infection    X last week off and on   HPI Presents for acute visit   Discussed the use of AI scribe software for clinical note transcription with the patient, who gave verbal consent to proceed.  History of Present Illness Vanessa Brown is an 83 year old female who presents with symptoms of a possible urinary tract infection.  She has been experiencing burning sensations since last Tuesday, which she associates with a possible urinary tract infection. It has been approximately ten years since her last similar episode. The burning sensation occurs upon waking and is not necessarily associated with urination. No urinary frequency, incomplete bladder emptying, back pain beyond her usual, fever, changes in urine color or smell, blood in the urine, or groin pain.  She mentions a potential trigger being the use of a new gel containing jalapeno peppers, which she used without realizing its contents. Additionally, she recently switched from her usual Dove soap to a new soap with oil, which she used around the time her symptoms began.  She has not taken any Azo pills yet but has purchased them along with cranberry juice, which she started consuming. The burning sensation has improved but is not completely resolved. Her current medication includes levothyroxine, which she has been taking for several years.  She is active, engaging in activities such as dancing four times a week and helping with neighborhood tasks, and she lives independently. She avoids soft drinks and is mindful of her diet due to a history of stomach ulcers.      PERTINENT PMH / PSH: As above  OBJECTIVE:  BP 124/76   Pulse (!) 53   Temp 98 F (36.7 C)   Resp 20   Ht 5\' 1"  (1.549 m)   Wt 122 lb 8 oz (55.6 kg)   SpO2 99%   BMI 23.15 kg/m    Physical Exam Vitals reviewed.  Constitutional:      General: She is not in acute distress.    Appearance:  Normal appearance. She is normal weight. She is not ill-appearing, toxic-appearing or diaphoretic.  Eyes:     General:        Right eye: No discharge.        Left eye: No discharge.     Conjunctiva/sclera: Conjunctivae normal.  Cardiovascular:     Rate and Rhythm: Normal rate and regular rhythm.     Heart sounds: Normal heart sounds.  Pulmonary:     Effort: Pulmonary effort is normal.     Breath sounds: Normal breath sounds.  Abdominal:     General: Bowel sounds are normal. There is no distension.     Palpations: Abdomen is soft.     Tenderness: There is no abdominal tenderness. There is no right CVA tenderness or left CVA tenderness.  Musculoskeletal:        General: Normal range of motion.  Skin:    General: Skin is warm and dry.  Neurological:     General: No focal deficit present.     Mental Status: She is alert and oriented to person, place, and time. Mental status is at baseline.  Psychiatric:        Mood and Affect: Mood normal.        Behavior: Behavior normal.        Thought Content: Thought content normal.        Judgment: Judgment normal.  01/06/2024    9:44 AM 11/25/2023    3:45 PM 08/20/2022    3:40 PM 04/10/2022   11:31 AM 02/12/2021   10:47 AM  Depression screen PHQ 2/9  Decreased Interest 0 0 0 0 0  Down, Depressed, Hopeless 0 0 0 0 0  PHQ - 2 Score 0 0 0 0 0  Altered sleeping 0 0     Tired, decreased energy 1 1     Change in appetite 0 0     Feeling bad or failure about yourself  0 0     Trouble concentrating 0 0     Moving slowly or fidgety/restless 0 0     Suicidal thoughts 0 0     PHQ-9 Score 1 1     Difficult doing work/chores Not difficult at all Not difficult at all         01/06/2024    9:45 AM  GAD 7 : Generalized Anxiety Score  Nervous, Anxious, on Edge 0  Control/stop worrying 0  Worry too much - different things 0  Trouble relaxing 0  Restless 0  Easily annoyed or irritable 0  Afraid - awful might happen 0  Total GAD 7  Score 0  Anxiety Difficulty Not difficult at all    ASSESSMENT/PLAN:  Burning with urination Assessment & Plan: Urinalysis indicated possible UTI with WBC presence. Differential includes irritation from new soap or gel. Prefers to avoid antibiotics unless necessary.   - Send urine for culture.   - Advise increased fluid intake, 32 to 64 ounces of water daily.   - Recommend cranberry juice or cranberry pills.   - Advise use of Azo pills as needed for symptom relief.   - Instruct to avoid scented soaps in the genital area.   - Monitor for worsening symptoms such as fever, hematuria, or increased pain and report if she occurs.    Orders: -     POCT Urinalysis Dipstick (Automated) -     Urine Culture  Acquired hypothyroidism Assessment & Plan: On levothyroxine, concerns about medication addressed. Advised not to discontinue without consulting Dr. Lottie Mussel.   - Continue levothyroxine as prescribed.   - Discuss any concerns about levothyroxine with PCP at the upcoming appointment in April.     PDMP reviewed  Return if symptoms worsen or fail to improve, for PCP.  Dana Allan, MD

## 2024-01-07 LAB — URINE CULTURE
MICRO NUMBER:: 16221029
SPECIMEN QUALITY:: ADEQUATE

## 2024-01-11 ENCOUNTER — Telehealth: Payer: Self-pay

## 2024-01-11 ENCOUNTER — Encounter: Payer: Self-pay | Admitting: Family Medicine

## 2024-01-11 DIAGNOSIS — R300 Dysuria: Secondary | ICD-10-CM | POA: Insufficient documentation

## 2024-01-11 NOTE — Assessment & Plan Note (Signed)
 Urinalysis indicated possible UTI with WBC presence. Differential includes irritation from new soap or gel. Prefers to avoid antibiotics unless necessary.   - Send urine for culture.   - Advise increased fluid intake, 32 to 64 ounces of water daily.   - Recommend cranberry juice or cranberry pills.   - Advise use of Azo pills as needed for symptom relief.   - Instruct to avoid scented soaps in the genital area.   - Monitor for worsening symptoms such as fever, hematuria, or increased pain and report if she occurs.

## 2024-01-11 NOTE — Assessment & Plan Note (Signed)
 On levothyroxine, concerns about medication addressed. Advised not to discontinue without consulting Dr. Lottie Mussel.   - Continue levothyroxine as prescribed.   - Discuss any concerns about levothyroxine with PCP at the upcoming appointment in April.

## 2024-01-11 NOTE — Telephone Encounter (Signed)
 Pt saw Dr. Clent Ridges for this

## 2024-01-11 NOTE — Telephone Encounter (Signed)
 Noted.

## 2024-02-02 ENCOUNTER — Ambulatory Visit (INDEPENDENT_AMBULATORY_CARE_PROVIDER_SITE_OTHER): Admitting: Internal Medicine

## 2024-02-02 ENCOUNTER — Encounter: Payer: Self-pay | Admitting: Internal Medicine

## 2024-02-02 VITALS — BP 130/66 | HR 50 | Ht 61.0 in | Wt 119.6 lb

## 2024-02-02 DIAGNOSIS — D649 Anemia, unspecified: Secondary | ICD-10-CM | POA: Diagnosis not present

## 2024-02-02 DIAGNOSIS — E559 Vitamin D deficiency, unspecified: Secondary | ICD-10-CM

## 2024-02-02 DIAGNOSIS — E785 Hyperlipidemia, unspecified: Secondary | ICD-10-CM

## 2024-02-02 DIAGNOSIS — R300 Dysuria: Secondary | ICD-10-CM

## 2024-02-02 DIAGNOSIS — G250 Essential tremor: Secondary | ICD-10-CM | POA: Diagnosis not present

## 2024-02-02 DIAGNOSIS — R7301 Impaired fasting glucose: Secondary | ICD-10-CM

## 2024-02-02 DIAGNOSIS — E039 Hypothyroidism, unspecified: Secondary | ICD-10-CM | POA: Diagnosis not present

## 2024-02-02 DIAGNOSIS — Z780 Asymptomatic menopausal state: Secondary | ICD-10-CM | POA: Diagnosis not present

## 2024-02-02 DIAGNOSIS — Z0000 Encounter for general adult medical examination without abnormal findings: Secondary | ICD-10-CM | POA: Insufficient documentation

## 2024-02-02 MED ORDER — LEVOTHYROXINE SODIUM 50 MCG PO TABS: 50.0000 ug | ORAL_TABLET | Freq: Every day | ORAL | 5 refills | Status: DC

## 2024-02-02 MED ORDER — ESTRADIOL 0.025 MG/24HR TD PTWK: 0.0250 mg | MEDICATED_PATCH | TRANSDERMAL | 11 refills | Status: AC

## 2024-02-02 NOTE — Assessment & Plan Note (Signed)
 Her ten year risk of CAD /CVA is 33% based on most recent lipid profile applied to the [facility] calculator.  Statin has been recommended as primary prevention in the past but declined; repeat level today  is pending .   Lab Results  Component Value Date   CHOL 304 (H) 09/01/2022   HDL 87.70 09/01/2022   LDLCALC 200 (H) 09/01/2022   LDLDIRECT 167.0 04/10/2022   TRIG 79.0 09/01/2022   CHOLHDL 3 09/01/2022

## 2024-02-02 NOTE — Progress Notes (Signed)
 Patient ID: Vanessa Brown, female    DOB: 10/03/1941  Age: 83 y.o. MRN: [id number]  The patient is here for annual preventive wellness examination and management of other chronic and acute problems.   The risk factors are reflected in the social history.  The roster of all physicians providing medical care to patient - is listed in the Snapshot section of the chart.  Activities of daily living:  The patient is 100% independent in all ADLs: dressing, toileting, feeding as well as independent mobility  Home safety : The patient has smoke detectors in the home. They wear seatbelts.  There are no firearms at home. There is no violence in the home.   There is no risks for hepatitis, STDs or HIV. There is no   history of blood transfusion. They have no travel history to infectious disease endemic areas of the world.  The patient has seen their dentist in the last six month. They have seen their eye doctor in the last year. They admit to slight hearing difficulty with regard to whispered voices and some television programs.  They have deferred audiologic testing in the last year.  They do not  have excessive sun exposure. Discussed the need for sun protection: hats, long sleeves and use of sunscreen if there is significant sun exposure.   Diet: the importance of a healthy diet is discussed. They do have a healthy diet.  The benefits of regular aerobic exercise were discussed. She  is physically active 5 days per week doing yardwork, dancing at home    Depression screen: there are no signs or vegative symptoms of depression- irritability, change in appetite, anhedonia, sadness/tearfullness.  Cognitive assessment: the patient manages all their financial and personal affairs and is actively engaged. They could relate day,date,year and events; recalled 2/3 objects at 3 minutes; performed clock-face test normally.  The following portions of the patient's history were reviewed and updated as appropriate:  allergies, current medications, past family history, past medical history,  past surgical history, past social history  and problem list.  Visual acuity was not assessed per patient preference since she has regular follow up with her ophthalmologist. Hearing and body mass index were assessed and reviewed.   During the course of the visit the patient was educated and counseled about appropriate screening and preventive services including : fall prevention , diabetes screening, nutrition counseling, colorectal cancer screening, and recommended immunizations.    CC: The primary encounter diagnosis was Hyperlipidemia LDL goal <130. Diagnoses of Acquired hypothyroidism, Impaired fasting glucose, Anemia, unspecified type, Asymptomatic postmenopausal estrogen deficiency, Vitamin D deficiency, Burning with urination, Benign essential tremor, and Preventative health care were also pertinent to this visit.  History of low back pain due to DDD .  Saw Chasnis in July 2024 ,   [facility] scheduled but not done. Has pain with vacuuming,  doing too much yardwork.  Rests and recovers.  Avoiding NSAIDS due to history of gastric ulcer   Declines continued screening for breast CA   Weight loss of 4 lbs since last year.  Not intentional. Eats a lot of sandwiches since she lives alone.   Recent evaluation for dysuria  which occurred after eating some food with  jalopeno peppers  .  Culture was negative, POC UA noted  leukocytes   Wears contacts, sees eye doc Dr Alto Atta   History Yalda has a past medical history of Anxiety, Arthritis, Cervical back pain with evidence of disc disease (2007), Duodenal ulcer, Gastric ulcer, GERD (gastroesophageal  reflux disease), Irritable bowel syndrome (IBS), Neuropathy, Screening for breast cancer, Screening for cervical cancer, and Tremor, essential.   She has a past surgical history that includes Rotator cuff repair (2004); Biopsy breast; Abdominal hysterectomy (1967); Breast surgery; Breast  biopsy (Right, 2012); and Breast biopsy (Left, 2007).   Her family history includes Dementia in her father; Diabetes in her mother and son; Heart block in her sister; Parkinson's disease in her father.She reports that she quit smoking about 24 years ago. Her smoking use included cigarettes. She has never used smokeless tobacco. She reports current alcohol use. She reports that she does not use drugs.  Outpatient Medications Prior to Visit  Medication Sig Dispense Refill   Biotin 5000 MCG CAPS Take 1 capsule by mouth daily.     cholecalciferol (VITAMIN D) 1000 units tablet Take 1,000 Units by mouth daily.     cyanocobalamin 1000 MCG tablet Take 1,000 mcg by mouth daily.     Multiple Vitamins-Minerals (MULTIVITAMIN ADULT EXTRA C PO) Take by mouth.     primidone (MYSOLINE) 250 MG tablet Take 1 tablet by mouth 2 (two) times daily.     propranolol (INDERAL) 40 MG tablet Take 40 mg by mouth 2 (two) times daily.     estradiol (CLIMARA - DOSED IN MG/24 HR) 0.025 mg/24hr patch Place 1 patch (0.025 mg total) onto the skin once a week. 4 patch 11   levothyroxine (SYNTHROID) 50 MCG tablet Take 1 tablet (50 mcg total) by mouth daily. 30 tablet 5   No facility-administered medications prior to visit.    Review of Systems  Patient denies headache, fevers, malaise, unintentional weight loss, skin rash, eye pain, sinus congestion and sinus pain, sore throat, dysphagia,  hemoptysis , cough, dyspnea, wheezing, chest pain, palpitations, orthopnea, edema, abdominal pain, nausea, melena, diarrhea, constipation, flank pain, dysuria, hematuria, urinary  Frequency, nocturia, numbness, tingling, seizures,  Focal weakness, Loss of consciousness,  Tremor, insomnia, depression, anxiety, and suicidal ideation.     Objective:  BP 130/66   Pulse (!) 50   Ht 5\' 1"  (1.549 m)   Wt 119 lb 9.6 oz (54.3 kg)   SpO2 97%   BMI 22.60 kg/m   Physical Exam Vitals reviewed.  Constitutional:      General: She is not in acute  distress.    Appearance: Normal appearance. She is normal weight. She is not ill-appearing, toxic-appearing or diaphoretic.  HENT:     Head: Normocephalic.  Eyes:     General: No scleral icterus.       Right eye: No discharge.        Left eye: No discharge.     Conjunctiva/sclera: Conjunctivae normal.  Cardiovascular:     Rate and Rhythm: Normal rate and regular rhythm.     Heart sounds: Normal heart sounds.  Pulmonary:     Effort: Pulmonary effort is normal. No respiratory distress.     Breath sounds: Normal breath sounds.  Musculoskeletal:        General: Normal range of motion.  Skin:    General: Skin is warm and dry.  Neurological:     General: No focal deficit present.     Mental Status: She is alert and oriented to person, place, and time. Mental status is at baseline.  Psychiatric:        Mood and Affect: Mood normal.        Behavior: Behavior normal.        Thought Content: Thought content normal.  Judgment: Judgment normal.      Assessment & Plan:  Hyperlipidemia LDL goal <130 Assessment & Plan: Her ten year risk of CAD /CVA is 33% based on most recent lipid profile applied to the [facility] calculator.  Statin has been recommended as primary prevention in the past but declined; repeat level today  is pending .   Lab Results  Component Value Date   CHOL 304 (H) 09/01/2022   HDL 87.70 09/01/2022   LDLCALC 200 (H) 09/01/2022   LDLDIRECT 167.0 04/10/2022   TRIG 79.0 09/01/2022   CHOLHDL 3 09/01/2022     Orders: -     Lipid panel -     LDL cholesterol, direct  Acquired hypothyroidism Assessment & Plan: On levothyroxine,  50 mcg dose . Level is overdue  Lab Results  Component Value Date   TSH 3.72 09/01/2022     Orders: -     Levothyroxine Sodium; Take 1 tablet (50 mcg total) by mouth daily.  Dispense: 30 tablet; Refill: 5 -     TSH  Impaired fasting glucose -     Comprehensive metabolic panel with GFR -     Hemoglobin A1c  Anemia, unspecified  type -     CBC with Differential/Platelet  Asymptomatic postmenopausal estrogen deficiency -     DG Bone Density; Future  Vitamin D deficiency -     VITAMIN D 25 Hydroxy (Vit-D Deficiency, Fractures)  Burning with urination Assessment & Plan: No UT by UA/culture.  Not occurring frequently .  Reviewed symptoms , recommended trial of vaginal estrogen if symptoms recur without confirmation of infection    Benign essential tremor Assessment & Plan: Symptoms are mild and no not prevent her from leading an active lifestyle. Continue propranolol and primodone (shah)   Preventative health care Assessment & Plan: age appropriate education and counseling updated, referrals for preventative services and immunizations addressed, dietary and smoking counseling addressed, most recent labs reviewed.  I have personally reviewed and have noted:   1) the patient's medical and social history 2) The pt's use of alcohol, tobacco, and illicit drugs 3) The patient's current medications and supplements 4) Functional ability including ADL's, fall risk, home safety risk, hearing and visual impairment 5) Diet and physical activities 6) Evidence for depression or mood disorder 7) The patient's height, weight, and BMI have been recorded in the chart   I have made referrals, and provided counseling and education based on review of the above    Other orders -     Estradiol; Place 1 patch (0.025 mg total) onto the skin once a week.  Dispense: 4 patch; Refill: 11      I provided 40 minutes of  face-to-face time during this encounter reviewing patient's current problems and past surgeries,  recent labs and imaging studies, providing counseling on the above mentioned problems , and coordination  of care .   Follow-up: No follow-ups on file.   Thersia Flax, MD

## 2024-02-02 NOTE — Assessment & Plan Note (Addendum)
 No UT by UA/culture.  Not occurring frequently .  Reviewed symptoms , recommended trial of vaginal estrogen if symptoms recur without confirmation of infection

## 2024-02-02 NOTE — Assessment & Plan Note (Signed)

## 2024-02-02 NOTE — Assessment & Plan Note (Signed)
 Symptoms are mild and no not prevent her from leading an active lifestyle. Continue propranolol and primodone (shah)

## 2024-02-02 NOTE — Patient Instructions (Addendum)
 You are DUE for your tetanus-diptheria-pertussis vaccine   (TDaP)   Please get this done at your pharmacy l;  it will be PAID FOR MY MEDICARE ONLY AT [facility]    You are encouraged (required) to call to schedule your own  appointment at [facility]  , and their phone umber is [phone]

## 2024-02-02 NOTE — Assessment & Plan Note (Signed)
 Continue annual follow up with Dr Arlyss Laming

## 2024-02-02 NOTE — Assessment & Plan Note (Signed)
 On levothyroxine,  50 mcg dose . Level is overdue  Lab Results  Component Value Date   TSH 3.72 09/01/2022

## 2024-02-03 LAB — LIPID PANEL
Cholesterol: 270 mg/dL — ABNORMAL HIGH (ref 0–200)
HDL: 80.4 mg/dL (ref >39.00)
LDL Cholesterol: 171 mg/dL — ABNORMAL HIGH (ref 0–99)
NonHDL: 189.23
Total CHOL/HDL Ratio: 3
Triglycerides: 93 mg/dL (ref 0.0–149.0)
VLDL: 18.6 mg/dL (ref 0.0–40.0)

## 2024-02-03 LAB — CBC WITH DIFFERENTIAL/PLATELET
Basophils Absolute: 0.1 10*3/uL (ref 0.0–0.1)
Basophils Relative: 1.2 % (ref 0.0–3.0)
Eosinophils Absolute: 0.5 10*3/uL (ref 0.0–0.7)
Eosinophils Relative: 7.4 % — ABNORMAL HIGH (ref 0.0–5.0)
HCT: 38.4 % (ref 36.0–46.0)
Hemoglobin: 12.7 g/dL (ref 12.0–15.0)
Lymphocytes Relative: 40.6 % (ref 12.0–46.0)
Lymphs Abs: 2.5 10*3/uL (ref 0.7–4.0)
MCHC: 33 g/dL (ref 30.0–36.0)
MCV: 105.1 fl — ABNORMAL HIGH (ref 78.0–100.0)
Monocytes Absolute: 0.6 10*3/uL (ref 0.1–1.0)
Monocytes Relative: 9.3 % (ref 3.0–12.0)
Neutro Abs: 2.5 10*3/uL (ref 1.4–7.7)
Neutrophils Relative %: 41.5 % — ABNORMAL LOW (ref 43.0–77.0)
Platelets: 407 10*3/uL — ABNORMAL HIGH (ref 150.0–400.0)
RBC: 3.65 Mil/uL — ABNORMAL LOW (ref 3.87–5.11)
RDW: 14.5 % (ref 11.5–15.5)
WBC: 6.1 10*3/uL (ref 4.0–10.5)

## 2024-02-03 LAB — COMPREHENSIVE METABOLIC PANEL WITH GFR
ALT: 9 U/L (ref 0–35)
AST: 14 U/L (ref 0–37)
Albumin: 4.2 g/dL (ref 3.5–5.2)
Alkaline Phosphatase: 115 U/L (ref 39–117)
BUN: 13 mg/dL (ref 6–23)
CO2: 27 meq/L (ref 19–32)
Calcium: 9.1 mg/dL (ref 8.4–10.5)
Chloride: 102 meq/L (ref 96–112)
Creatinine, Ser: 0.9 mg/dL (ref 0.40–1.20)
GFR: 59.12 mL/min — ABNORMAL LOW (ref >60.00)
Glucose, Bld: 78 mg/dL (ref 70–99)
Potassium: 4.9 meq/L (ref 3.5–5.1)
Sodium: 137 meq/L (ref 135–145)
Total Bilirubin: 0.3 mg/dL (ref 0.2–1.2)
Total Protein: 7.3 g/dL (ref 6.0–8.3)

## 2024-02-03 LAB — HEMOGLOBIN A1C: Hgb A1c MFr Bld: 5.5 % (ref 4.6–6.5)

## 2024-02-03 LAB — TSH: TSH: 2.68 u[IU]/mL (ref 0.35–5.50)

## 2024-02-03 LAB — VITAMIN D 25 HYDROXY (VIT D DEFICIENCY, FRACTURES): VITD: 54.89 ng/mL (ref 30.00–100.00)

## 2024-02-03 LAB — LDL CHOLESTEROL, DIRECT: Direct LDL: 163 mg/dL

## 2024-04-06 DIAGNOSIS — R001 Bradycardia, unspecified: Secondary | ICD-10-CM | POA: Diagnosis not present

## 2024-04-06 DIAGNOSIS — G250 Essential tremor: Secondary | ICD-10-CM | POA: Diagnosis not present

## 2024-08-16 DIAGNOSIS — H5203 Hypermetropia, bilateral: Secondary | ICD-10-CM | POA: Diagnosis not present

## 2024-08-16 DIAGNOSIS — H2513 Age-related nuclear cataract, bilateral: Secondary | ICD-10-CM | POA: Diagnosis not present

## 2024-08-16 DIAGNOSIS — H0288B Meibomian gland dysfunction left eye, upper and lower eyelids: Secondary | ICD-10-CM | POA: Diagnosis not present

## 2024-08-16 DIAGNOSIS — H0288A Meibomian gland dysfunction right eye, upper and lower eyelids: Secondary | ICD-10-CM | POA: Diagnosis not present

## 2024-09-05 ENCOUNTER — Other Ambulatory Visit: Payer: Self-pay | Admitting: Internal Medicine

## 2024-09-05 DIAGNOSIS — E039 Hypothyroidism, unspecified: Secondary | ICD-10-CM

## 2024-10-05 ENCOUNTER — Other Ambulatory Visit: Payer: Self-pay | Admitting: Internal Medicine

## 2024-10-05 DIAGNOSIS — E039 Hypothyroidism, unspecified: Secondary | ICD-10-CM

## 2024-11-07 ENCOUNTER — Other Ambulatory Visit: Payer: Self-pay | Admitting: Internal Medicine

## 2024-11-07 DIAGNOSIS — E039 Hypothyroidism, unspecified: Secondary | ICD-10-CM

## 2024-11-29 ENCOUNTER — Ambulatory Visit: Payer: Medicare Other

## 2025-01-09 ENCOUNTER — Encounter: Admitting: Internal Medicine

## 2025-02-08 ENCOUNTER — Ambulatory Visit
